# Patient Record
Sex: Male | Born: 1940 | Race: White | Hispanic: No | Smoking: Current some day smoker
Health system: Southern US, Community
[De-identification: ages and names within clinical notes are randomized; demographics above are authoritative.]

## PROBLEM LIST (undated history)

## (undated) DIAGNOSIS — L570 Actinic keratosis: Secondary | ICD-10-CM

## (undated) DIAGNOSIS — Z860100 Personal history of colon polyps, unspecified: Secondary | ICD-10-CM

## (undated) DIAGNOSIS — M8580 Other specified disorders of bone density and structure, unspecified site: Secondary | ICD-10-CM

## (undated) DIAGNOSIS — Z85828 Personal history of other malignant neoplasm of skin: Secondary | ICD-10-CM

## (undated) DIAGNOSIS — C61 Malignant neoplasm of prostate: Secondary | ICD-10-CM

## (undated) DIAGNOSIS — M1990 Unspecified osteoarthritis, unspecified site: Secondary | ICD-10-CM

## (undated) DIAGNOSIS — T7840XA Allergy, unspecified, initial encounter: Secondary | ICD-10-CM

## (undated) HISTORY — PX: KNEE SURGERY: SHX244

## (undated) HISTORY — DX: Malignant neoplasm of prostate: C61

## (undated) HISTORY — DX: Other specified disorders of bone density and structure, unspecified site: M85.80

## (undated) HISTORY — DX: Actinic keratosis: L57.0

## (undated) HISTORY — DX: Personal history of colonic polyps: Z86.010

## (undated) HISTORY — PX: KNEE ARTHROSCOPY: SUR90

## (undated) HISTORY — DX: Personal history of colon polyps, unspecified: Z86.0100

## (undated) HISTORY — DX: Allergy, unspecified, initial encounter: T78.40XA

## (undated) HISTORY — DX: Personal history of other malignant neoplasm of skin: Z85.828

## (undated) HISTORY — PX: INSERTION PROSTATE RADIATION SEED: SUR718

## (undated) HISTORY — DX: Unspecified osteoarthritis, unspecified site: M19.90

## (undated) HISTORY — PX: EYE SURGERY: SHX253

## (undated) HISTORY — PX: TONSILLECTOMY: SUR1361

---

## 2003-12-19 ENCOUNTER — Ambulatory Visit: Payer: Self-pay | Admitting: Internal Medicine

## 2003-12-24 ENCOUNTER — Ambulatory Visit: Payer: Self-pay | Admitting: Internal Medicine

## 2004-03-03 ENCOUNTER — Ambulatory Visit: Admission: RE | Admit: 2004-03-03 | Discharge: 2004-06-01 | Payer: Self-pay | Admitting: Radiation Oncology

## 2004-04-15 ENCOUNTER — Encounter: Admission: RE | Admit: 2004-04-15 | Discharge: 2004-04-15 | Payer: Self-pay | Admitting: Urology

## 2004-05-14 ENCOUNTER — Ambulatory Visit (HOSPITAL_BASED_OUTPATIENT_CLINIC_OR_DEPARTMENT_OTHER): Admission: RE | Admit: 2004-05-14 | Discharge: 2004-05-14 | Payer: Self-pay | Admitting: Urology

## 2004-05-14 ENCOUNTER — Ambulatory Visit (HOSPITAL_COMMUNITY): Admission: RE | Admit: 2004-05-14 | Discharge: 2004-05-14 | Payer: Self-pay | Admitting: Urology

## 2004-06-16 ENCOUNTER — Ambulatory Visit: Admission: RE | Admit: 2004-06-16 | Discharge: 2004-06-16 | Payer: Self-pay | Admitting: Radiation Oncology

## 2004-06-24 ENCOUNTER — Ambulatory Visit: Admission: RE | Admit: 2004-06-24 | Discharge: 2004-06-24 | Payer: Self-pay | Admitting: Radiation Oncology

## 2004-06-27 ENCOUNTER — Ambulatory Visit: Payer: Self-pay | Admitting: Internal Medicine

## 2005-01-26 ENCOUNTER — Ambulatory Visit: Payer: Self-pay | Admitting: Internal Medicine

## 2005-01-30 ENCOUNTER — Ambulatory Visit: Payer: Self-pay | Admitting: Internal Medicine

## 2005-08-04 ENCOUNTER — Ambulatory Visit: Payer: Self-pay | Admitting: Internal Medicine

## 2006-01-12 HISTORY — PX: OTHER SURGICAL HISTORY: SHX169

## 2006-02-01 ENCOUNTER — Ambulatory Visit: Payer: Self-pay | Admitting: Internal Medicine

## 2006-05-18 ENCOUNTER — Ambulatory Visit: Payer: Self-pay | Admitting: Internal Medicine

## 2006-05-18 LAB — CONVERTED CEMR LAB
AST: 28 units/L (ref 0–37)
Alkaline Phosphatase: 58 units/L (ref 39–117)
Basophils Relative: 0.7 % (ref 0.0–1.0)
Bilirubin Urine: NEGATIVE
Bilirubin, Direct: 0.1 mg/dL (ref 0.0–0.3)
Calcium: 9 mg/dL (ref 8.4–10.5)
Chloride: 108 meq/L (ref 96–112)
Cholesterol: 151 mg/dL (ref 0–200)
Creatinine, Ser: 0.9 mg/dL (ref 0.4–1.5)
GFR calc non Af Amer: 90 mL/min
HCT: 45 % (ref 39.0–52.0)
Hemoglobin, Urine: NEGATIVE
Hemoglobin: 15.7 g/dL (ref 13.0–17.0)
Ketones, ur: NEGATIVE mg/dL
LDL Cholesterol: 106 mg/dL — ABNORMAL HIGH (ref 0–99)
MCHC: 34.9 g/dL (ref 30.0–36.0)
PSA: 0.79 ng/mL (ref 0.10–4.00)
Platelets: 199 10*3/uL (ref 150–400)
RBC: 4.54 M/uL (ref 4.22–5.81)
RDW: 12.2 % (ref 11.5–14.6)
Sodium: 141 meq/L (ref 135–145)
TSH: 1.05 microintl units/mL (ref 0.35–5.50)
Total Bilirubin: 1.1 mg/dL (ref 0.3–1.2)
Total Protein: 7.1 g/dL (ref 6.0–8.3)
Urine Glucose: NEGATIVE mg/dL
Urobilinogen, UA: 1 (ref 0.0–1.0)

## 2006-05-25 ENCOUNTER — Ambulatory Visit: Payer: Self-pay | Admitting: Internal Medicine

## 2006-06-18 ENCOUNTER — Ambulatory Visit: Payer: Self-pay | Admitting: Gastroenterology

## 2006-07-02 ENCOUNTER — Ambulatory Visit: Payer: Self-pay | Admitting: Gastroenterology

## 2006-10-14 ENCOUNTER — Encounter: Payer: Self-pay | Admitting: Internal Medicine

## 2006-10-14 DIAGNOSIS — Z8546 Personal history of malignant neoplasm of prostate: Secondary | ICD-10-CM | POA: Insufficient documentation

## 2006-11-18 ENCOUNTER — Ambulatory Visit: Payer: Self-pay | Admitting: Internal Medicine

## 2006-11-18 DIAGNOSIS — M8580 Other specified disorders of bone density and structure, unspecified site: Secondary | ICD-10-CM | POA: Insufficient documentation

## 2006-11-18 DIAGNOSIS — M949 Disorder of cartilage, unspecified: Secondary | ICD-10-CM

## 2006-11-18 DIAGNOSIS — M899 Disorder of bone, unspecified: Secondary | ICD-10-CM | POA: Insufficient documentation

## 2006-11-18 DIAGNOSIS — J309 Allergic rhinitis, unspecified: Secondary | ICD-10-CM | POA: Insufficient documentation

## 2007-03-08 ENCOUNTER — Encounter: Payer: Self-pay | Admitting: Internal Medicine

## 2007-04-28 ENCOUNTER — Ambulatory Visit: Payer: Self-pay | Admitting: Internal Medicine

## 2007-04-28 LAB — CONVERTED CEMR LAB
Albumin: 3.7 g/dL (ref 3.5–5.2)
Alkaline Phosphatase: 58 units/L (ref 39–117)
BUN: 15 mg/dL (ref 6–23)
Basophils Absolute: 0 10*3/uL (ref 0.0–0.1)
Bilirubin Urine: NEGATIVE
Chloride: 105 meq/L (ref 96–112)
Cholesterol: 148 mg/dL (ref 0–200)
Creatinine, Ser: 0.9 mg/dL (ref 0.4–1.5)
Crystals: NEGATIVE
Eosinophils Absolute: 0.3 10*3/uL (ref 0.0–0.7)
GFR calc Af Amer: 109 mL/min
LDL Cholesterol: 107 mg/dL — ABNORMAL HIGH (ref 0–99)
MCHC: 34.8 g/dL (ref 30.0–36.0)
Monocytes Absolute: 0.4 10*3/uL (ref 0.1–1.0)
Neutro Abs: 2.3 10*3/uL (ref 1.4–7.7)
PSA: 0.18 ng/mL (ref 0.10–4.00)
Potassium: 4.1 meq/L (ref 3.5–5.1)
RDW: 12.4 % (ref 11.5–14.6)
Specific Gravity, Urine: >=1.03 (ref 1.000–1.03)
TSH: 1.46 microintl units/mL (ref 0.35–5.50)
Total Protein, Urine: NEGATIVE mg/dL
Triglycerides: 45 mg/dL (ref 0–149)
Urine Glucose: NEGATIVE mg/dL
Urobilinogen, UA: 1 (ref 0.0–1.0)
WBC: 4.9 10*3/uL (ref 4.5–10.5)
pH: 5.5 (ref 5.0–8.0)

## 2007-05-02 ENCOUNTER — Ambulatory Visit: Payer: Self-pay | Admitting: Internal Medicine

## 2007-05-02 DIAGNOSIS — Z860100 Personal history of colon polyps, unspecified: Secondary | ICD-10-CM | POA: Insufficient documentation

## 2007-06-22 ENCOUNTER — Encounter: Payer: Self-pay | Admitting: Internal Medicine

## 2007-10-31 ENCOUNTER — Ambulatory Visit: Payer: Self-pay | Admitting: Internal Medicine

## 2007-10-31 DIAGNOSIS — R1031 Right lower quadrant pain: Secondary | ICD-10-CM | POA: Insufficient documentation

## 2007-10-31 DIAGNOSIS — R1110 Vomiting, unspecified: Secondary | ICD-10-CM | POA: Insufficient documentation

## 2007-10-31 LAB — CONVERTED CEMR LAB
AST: 26 units/L (ref 0–37)
Alkaline Phosphatase: 67 units/L (ref 39–117)
Basophils Absolute: 0 10*3/uL (ref 0.0–0.1)
CO2: 29 meq/L (ref 19–32)
Creatinine, Ser: 1.4 mg/dL (ref 0.4–1.5)
Eosinophils Absolute: 0 10*3/uL (ref 0.0–0.7)
GFR calc non Af Amer: 54 mL/min
Glucose, Bld: 112 mg/dL — ABNORMAL HIGH (ref 70–99)
HCT: 46.1 % (ref 39.0–52.0)
Hemoglobin: 16.2 g/dL (ref 13.0–17.0)
Lymphocytes Relative: 16.9 % (ref 12.0–46.0)
MCHC: 35.2 g/dL (ref 30.0–36.0)
Monocytes Absolute: 0.7 10*3/uL (ref 0.1–1.0)
Neutro Abs: 7.2 10*3/uL (ref 1.4–7.7)
Neutrophils Relative %: 74.5 % (ref 43.0–77.0)
Platelets: 177 10*3/uL (ref 150–400)
Potassium: 4.1 meq/L (ref 3.5–5.1)
RBC / HPF: NONE SEEN
RDW: 12 % (ref 11.5–14.6)
Sed Rate: 13 mm/hr (ref 0–16)
Specific Gravity, Urine: >=1.03 (ref 1.000–1.03)
Total Bilirubin: 1.1 mg/dL (ref 0.3–1.2)
Total Protein, Urine: 30 mg/dL — AB

## 2007-12-02 ENCOUNTER — Encounter: Payer: Self-pay | Admitting: Internal Medicine

## 2008-03-27 ENCOUNTER — Ambulatory Visit: Payer: Self-pay | Admitting: Internal Medicine

## 2008-03-27 DIAGNOSIS — R319 Hematuria, unspecified: Secondary | ICD-10-CM | POA: Insufficient documentation

## 2008-03-27 LAB — CONVERTED CEMR LAB
BUN: 14 mg/dL (ref 6–23)
Basophils Absolute: 0.1 10*3/uL (ref 0.0–0.1)
Basophils Relative: 0.9 % (ref 0.0–3.0)
Calcium: 8.9 mg/dL (ref 8.4–10.5)
Chloride: 105 meq/L (ref 96–112)
Creatinine, Ser: 1.2 mg/dL (ref 0.4–1.5)
Eosinophils Absolute: 0.1 10*3/uL (ref 0.0–0.7)
Glucose, Bld: 91 mg/dL (ref 70–99)
HCT: 45.5 % (ref 39.0–52.0)
INR: 1 (ref 0.8–1.0)
Lymphs Abs: 1.8 10*3/uL (ref 0.7–4.0)
MCHC: 35.5 g/dL (ref 30.0–36.0)
MCV: 99.7 fL (ref 78.0–100.0)
Neutrophils Relative %: 56.5 % (ref 43.0–77.0)
Nitrite: POSITIVE
Platelets: 180 10*3/uL (ref 150.0–400.0)
RBC: 4.57 M/uL (ref 4.22–5.81)
RDW: 12.4 % (ref 11.5–14.6)
Sodium: 142 meq/L (ref 135–145)
Specific Gravity, Urine: 1.02 (ref 1.000–1.030)
Total Protein, Urine: 100 mg/dL
pH: 8.5 (ref 5.0–8.0)

## 2008-04-02 ENCOUNTER — Encounter: Admission: RE | Admit: 2008-04-02 | Discharge: 2008-04-02 | Payer: Self-pay | Admitting: Internal Medicine

## 2008-05-01 ENCOUNTER — Ambulatory Visit: Payer: Self-pay | Admitting: Internal Medicine

## 2008-05-01 LAB — CONVERTED CEMR LAB
Albumin: 3.8 g/dL (ref 3.5–5.2)
Alkaline Phosphatase: 63 units/L (ref 39–117)
BUN: 16 mg/dL (ref 6–23)
Basophils Relative: 1 % (ref 0.0–3.0)
Bilirubin, Direct: 0.1 mg/dL (ref 0.0–0.3)
CO2: 30 meq/L (ref 19–32)
Calcium: 9.1 mg/dL (ref 8.4–10.5)
Cholesterol: 151 mg/dL (ref 0–200)
Eosinophils Relative: 3.5 % (ref 0.0–5.0)
Glucose, Bld: 96 mg/dL (ref 70–99)
HDL: 34.8 mg/dL — ABNORMAL LOW (ref >39.00)
Hemoglobin: 15.9 g/dL (ref 13.0–17.0)
Ketones, ur: NEGATIVE mg/dL
MCHC: 34.6 g/dL (ref 30.0–36.0)
MCV: 99 fL (ref 78.0–100.0)
Neutrophils Relative %: 47.6 % (ref 43.0–77.0)
Nitrite: NEGATIVE
Platelets: 162 10*3/uL (ref 150.0–400.0)
RBC: 4.64 M/uL (ref 4.22–5.81)
Sodium: 144 meq/L (ref 135–145)
Total Bilirubin: 0.8 mg/dL (ref 0.3–1.2)
Total Protein: 7 g/dL (ref 6.0–8.3)
Urine Glucose: NEGATIVE mg/dL
Urobilinogen, UA: 0.2 (ref 0.0–1.0)

## 2008-05-04 ENCOUNTER — Ambulatory Visit: Payer: Self-pay | Admitting: Internal Medicine

## 2008-05-04 DIAGNOSIS — Z85828 Personal history of other malignant neoplasm of skin: Secondary | ICD-10-CM | POA: Insufficient documentation

## 2008-09-10 ENCOUNTER — Ambulatory Visit: Payer: Self-pay | Admitting: Family Medicine

## 2008-09-10 ENCOUNTER — Encounter: Payer: Self-pay | Admitting: Internal Medicine

## 2008-09-10 ENCOUNTER — Telehealth: Payer: Self-pay | Admitting: Internal Medicine

## 2008-09-13 ENCOUNTER — Ambulatory Visit: Payer: Self-pay | Admitting: Internal Medicine

## 2009-03-20 ENCOUNTER — Ambulatory Visit: Payer: Self-pay | Admitting: Internal Medicine

## 2009-03-20 DIAGNOSIS — I889 Nonspecific lymphadenitis, unspecified: Secondary | ICD-10-CM | POA: Insufficient documentation

## 2009-03-21 LAB — CONVERTED CEMR LAB
Basophils Absolute: 0 10*3/uL (ref 0.0–0.1)
Eosinophils Absolute: 0.1 10*3/uL (ref 0.0–0.7)
Eosinophils Relative: 1.4 % (ref 0.0–5.0)
Hemoglobin: 15.5 g/dL (ref 13.0–17.0)
Lymphocytes Relative: 29.3 % (ref 12.0–46.0)
MCHC: 33.4 g/dL (ref 30.0–36.0)
MCV: 102.5 fL — ABNORMAL HIGH (ref 78.0–100.0)
RBC: 4.53 M/uL (ref 4.22–5.81)
WBC: 6.2 10*3/uL (ref 4.5–10.5)

## 2009-04-05 ENCOUNTER — Encounter: Payer: Self-pay | Admitting: Internal Medicine

## 2009-08-19 ENCOUNTER — Ambulatory Visit: Payer: Self-pay | Admitting: Internal Medicine

## 2009-08-19 LAB — CONVERTED CEMR LAB
ALT: 31 units/L (ref 0–53)
AST: 24 units/L (ref 0–37)
Albumin: 3.8 g/dL (ref 3.5–5.2)
Alkaline Phosphatase: 57 units/L (ref 39–117)
Basophils Absolute: 0 10*3/uL (ref 0.0–0.1)
Basophils Relative: 0.4 % (ref 0.0–3.0)
Bilirubin Urine: NEGATIVE
Bilirubin, Direct: 0.1 mg/dL (ref 0.0–0.3)
CO2: 30 meq/L (ref 19–32)
Eosinophils Absolute: 0.2 10*3/uL (ref 0.0–0.7)
Glucose, Bld: 104 mg/dL — ABNORMAL HIGH (ref 70–99)
HDL: 34.4 mg/dL — ABNORMAL LOW (ref >39.00)
Hemoglobin: 15.8 g/dL (ref 13.0–17.0)
Leukocytes, UA: NEGATIVE
MCHC: 35.5 g/dL (ref 30.0–36.0)
MCV: 100.5 fL — ABNORMAL HIGH (ref 78.0–100.0)
Monocytes Absolute: 0.5 10*3/uL (ref 0.1–1.0)
Neutro Abs: 2.5 10*3/uL (ref 1.4–7.7)
Nitrite: NEGATIVE
PSA: 0.06 ng/mL — ABNORMAL LOW (ref 0.10–4.00)
Potassium: 4.5 meq/L (ref 3.5–5.1)
RBC: 4.41 M/uL (ref 4.22–5.81)
Total Bilirubin: 0.6 mg/dL (ref 0.3–1.2)
Triglycerides: 73 mg/dL (ref 0.0–149.0)
VLDL: 14.6 mg/dL (ref 0.0–40.0)

## 2009-08-22 ENCOUNTER — Ambulatory Visit: Payer: Self-pay | Admitting: Internal Medicine

## 2009-08-22 ENCOUNTER — Encounter: Payer: Self-pay | Admitting: Internal Medicine

## 2009-08-22 DIAGNOSIS — R0989 Other specified symptoms and signs involving the circulatory and respiratory systems: Secondary | ICD-10-CM | POA: Insufficient documentation

## 2009-08-26 ENCOUNTER — Encounter: Payer: Self-pay | Admitting: Internal Medicine

## 2009-08-27 ENCOUNTER — Ambulatory Visit: Payer: Self-pay

## 2009-08-27 ENCOUNTER — Encounter: Payer: Self-pay | Admitting: Internal Medicine

## 2009-10-08 ENCOUNTER — Encounter: Payer: Self-pay | Admitting: Internal Medicine

## 2009-10-18 ENCOUNTER — Ambulatory Visit: Payer: Self-pay | Admitting: Internal Medicine

## 2010-01-08 ENCOUNTER — Ambulatory Visit
Admission: RE | Admit: 2010-01-08 | Discharge: 2010-01-08 | Payer: Self-pay | Source: Home / Self Care | Attending: Internal Medicine | Admitting: Internal Medicine

## 2010-01-08 DIAGNOSIS — J019 Acute sinusitis, unspecified: Secondary | ICD-10-CM | POA: Insufficient documentation

## 2010-01-20 ENCOUNTER — Ambulatory Visit
Admission: RE | Admit: 2010-01-20 | Discharge: 2010-01-20 | Payer: Self-pay | Source: Home / Self Care | Attending: Internal Medicine | Admitting: Internal Medicine

## 2010-01-20 DIAGNOSIS — J310 Chronic rhinitis: Secondary | ICD-10-CM | POA: Insufficient documentation

## 2010-02-11 NOTE — Miscellaneous (Signed)
Summary: Orders Update  Clinical Lists Changes  Orders: Added new Test order of Carotid Duplex (Carotid Duplex) - Signed 

## 2010-02-11 NOTE — Assessment & Plan Note (Signed)
Summary: CPX/BCBS, MEDICARE/STILL EMPLOYED/STATES INS PAYS FOR LABS/CD   Vital Signs:  Patient profile:   70 year old male Height:      68 inches Weight:      204 pounds BMI:     31.13 O2 Sat:      96 % on Room air Temp:     98.4 degrees F oral Pulse rate:   68 / minute Pulse rhythm:   regular Resp:     16 per minute BP sitting:   110 / 70  (right arm) Cuff size:   regular  Vitals Entered By: Lanier Prude, CMA(AAMA) (August 22, 2009 8:41 AM)  O2 Flow:  Room air CC: AWV Is Patient Diabetic? No   CC:  AWV.  History of Present Illness: The patient presents for a preventive health examination   Current Medications (verified): 1)  Fish Oil   Oil (Fish Oil) .Marland Kitchen.. 1 Bid 2)  Vitamin D3 1000 Unit  Tabs (Cholecalciferol) .Marland Kitchen.. 1 By Mouth Daily  Allergies (verified): 1)  ! Adult Aspirin Low Strength (Aspirin) 2)  ! Zostrix (Capsaicin) 3)  ! Naprosyn (Naproxen)  Past History:  Past Medical History: Reviewed history from 05/04/2008 and no changes required. Prostate cancer, hx of 2006 s/p XRT, seeds Dr Chip Boer  has a Dermatologist apt q 1 y Osteopenia Allergic rhinitis Colonic polyps, hx of Dr Christella Hartigan due 2018 Skin cancer, hx of  Past Surgical History: XRT seeds 2008  Family History: Old age  F 30 liver mets M living 73  Social History: Occupation: traveling Airline pilot - ink Married Never Smoked  Review of Systems  The patient denies anorexia, fever, weight loss, weight gain, vision loss, decreased hearing, hoarseness, chest pain, syncope, dyspnea on exertion, peripheral edema, prolonged cough, headaches, hemoptysis, abdominal pain, melena, hematochezia, severe indigestion/heartburn, hematuria, incontinence, genital sores, muscle weakness, suspicious skin lesions, transient blindness, difficulty walking, depression, unusual weight change, abnormal bleeding, enlarged lymph nodes, angioedema, and testicular masses.    Physical Exam  General:   Well-developed,well-nourished,in no acute distress; alert,appropriate and cooperative throughout examination Head:  Normocephalic and atraumatic without obvious abnormalities. No apparent alopecia or balding. Eyes:  No corneal or conjunctival inflammation noted. EOMI. Perrla. Ears:  R wax Nose:  External nasal examination shows no deformity or inflammation. Nasal mucosa are pink and moist without lesions or exudates. Mouth:  Oral mucosa and oropharynx without lesions or exudates.  Teeth in good repair. Neck:  R mild bruit Lungs:  Normal respiratory effort, chest expands symmetrically. Lungs are clear to auscultation, no crackles or wheezes. Heart:  Normal rate and regular rhythm. S1 and S2 normal without gallop, murmur, click, rub or other extra sounds. Abdomen:  Bowel sounds positive,abdomen soft and non-tender without masses, organomegaly or hernias noted. Rectal:  per Urol Genitalia:  per Urol Prostate:  per Urol Msk:  No deformity or scoliosis noted of thoracic or lumbar spine.   Pulses:  R and L carotid,radial,femoral,dorsalis pedis and posterior tibial pulses are full and equal bilaterally Extremities:  No clubbing, cyanosis, edema, or deformity noted with normal full range of motion of all joints.   Neurologic:  No cranial nerve deficits noted. Station and gait are normal. Plantar reflexes are down-going bilaterally. DTRs are symmetrical throughout. Sensory, motor and coordinative functions appear intact. Skin:  Intact without suspicious lesions or rashes Cervical Nodes:  No lymphadenopathy noted Psych:  Cognition and judgment appear intact. Alert and cooperative with normal attention span and concentration. No apparent delusions, illusions, hallucinations   Impression &  Recommendations:  Problem # 1:  WELL ADULT EXAM (ICD-V70.0) Assessment New The labs were reviewed with the patient.  Health and age related issues were discussed. Available screening tests and vaccinations were  discussed as well. Healthy life style including good diet and exercise was discussed.  Orders: EKG w/ Interpretation (93000) nl  Problem # 2:  PROSTATE CANCER, HX OF (ICD-V10.46) Assessment: Improved F/u w/Urol  Problem # 3:  CAROTID BRUIT (ICD-785.9) Assessment: New ASA Orders: Doppler Referral (Doppler)  Complete Medication List: 1)  Fish Oil Oil (Fish oil) .Marland Kitchen.. 1 bid 2)  Vitamin D3 1000 Unit Tabs (Cholecalciferol) .Marland Kitchen.. 1 by mouth daily  Other Orders: Pneumococcal Vaccine (16109) Admin 1st Vaccine (60454)  Patient Instructions: 1)  Please schedule a follow-up appointment in 1 year well w/labs.   Immunizations Administered:  Pneumonia Vaccine:    Vaccine Type: Pneumovax    Site: left deltoid    Mfr: Merck    Dose: 0.5 ml    Route: IM    Given by: Lanier Prude, CMA(AAMA)    Exp. Date: 01/29/2011    Lot #: [id number]    VIS given: 08/10/95 version given August 22, 2009.

## 2010-02-11 NOTE — Letter (Signed)
[facility]   Imported By: Sherian Rein 04/18/2009 10:00:17  _____________________________________________________________________  External Attachment:    Type:   Image     Comment:   External Document

## 2010-02-11 NOTE — Assessment & Plan Note (Signed)
Summary: SHINGLE VAC-PER PT BCBS WILL PAY -AVP--AMI/OK DOUBLEBOOK--STC   Nurse Visit   Allergies: 1)  ! Adult Aspirin Low Strength (Aspirin) 2)  ! Zostrix (Capsaicin) 3)  ! Naprosyn (Naproxen)  Immunizations Administered:  Zostavax # 1:    Vaccine Type: Zostavax    Site: Left arm     Mfr: Merck    Dose: 0.5 ml    Route: IM    Given by: Lamar Sprinkles, CMA    Exp. Date: 08/07/2010    Lot #: [id number]    VIS given: 10/24/04 given October 18, 2009.  Orders Added: 1)  Zoster (Shingles) Vaccine Live [90736] 2)  Admin 1st Vaccine [id number]

## 2010-02-11 NOTE — Letter (Signed)
Summary: [facility]   Imported By: Sherian Rein 10/22/2009 13:53:51  _____________________________________________________________________  External Attachment:    Type:   Image     Comment:   External Document

## 2010-02-11 NOTE — Assessment & Plan Note (Signed)
Summary: lump top of neck,tender/#/cd   Vital Signs:  Patient profile:   70 year old male Height:      68 inches Weight:      203 pounds BMI:     30.98 Temp:     99 degrees F oral Pulse rate:   76 / minute BP sitting:   124 / 84  (left arm)  Vitals Entered By: Tora Perches (March 20, 2009 8:24 AM) CC: lump on neck Is Patient Diabetic? No   CC:  lump on neck.  History of Present Illness: C/o painful lump on R neck x 1 wk  Preventive Screening-Counseling & Management  Alcohol-Tobacco     Smoking Status: never  Current Medications (verified): 1)  Fish Oil   Oil (Fish Oil) .Marland Kitchen.. 1 Bid 2)  Vitamin D3 1000 Unit  Tabs (Cholecalciferol) .Marland Kitchen.. 1 By Mouth Daily  Allergies: 1)  ! Adult Aspirin Low Strength (Aspirin) 2)  ! Zostrix (Capsaicin) 3)  ! 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 Nasal mucosa are pink and moist without lesions or exudates. Mouth:  Oral mucosa and oropharynx without lesions or exudates.  Teeth in good repair. Neck:  2.5x 1.5 oval firm tender swelling over R lat neck below jaw angle Skin:  Intact without suspicious lesions or rashes Cervical Nodes:  as above Axillary Nodes:  No palpable lymphadenopathy Inguinal Nodes:  No significant adenopathy   Impression & Recommendations:  Problem # 1:  LYMPHADENITIS (ICD-289.3) R neck Assessment New Augm x 10 d CT or biopsy if not gone Orders: TLB-CBC Platelet - w/Differential  (85025-CBCD)  Complete Medication List: 1)  Fish Oil Oil (Fish oil) .Marland Kitchen.. 1 bid 2)  Vitamin D3 1000 Unit Tabs (Cholecalciferol) .Marland Kitchen.. 1 by mouth daily 3)  Augmentin 875-125 Mg Tabs (Amoxicillin-pot clavulanate) .Marland Kitchen.. 1 by mouth bid  Patient Instructions: 1)  Please schedule a follow-up appointment in 10 d. Prescriptions: AUGMENTIN 875-125 MG TABS (AMOXICILLIN-POT CLAVULANATE) 1 by mouth bid  #20 x 0   Entered and Authorized by:   Tresa Garter MD   Signed by:   Tresa Garter MD on 03/20/2009   Method used:   [facility] to        [facility] [phone]* (retail)       [address]       [place], [place]  [address]       Ph: [phone]       Fax: [phone]   RxID:   [id number]

## 2010-02-13 NOTE — Assessment & Plan Note (Signed)
Summary: SINUS/NWS   Vital Signs:  Patient profile:   70 year old male Height:      68 inches Weight:      201 pounds BMI:     30.67 Temp:     97.8 degrees F oral Pulse rate:   76 / minute Pulse rhythm:   regular Resp:     16 per minute BP sitting:   140 / 86  (left arm) Cuff size:   regular  Vitals Entered By: Lanier Prude, CMA(AAMA) (January 20, 2010 4:20 PM) CC: sinus congestion  Is Patient Diabetic? No   CC:  sinus congestion .  History of Present Illness: C/o L nasal congestion - bad D/c is clear now - antibiotic has helped  Current Medications (verified): 1)  Fish Oil   Oil (Fish Oil) .Marland Kitchen.. 1 Once Daily 2)  Vitamin D3 1000 Unit  Tabs (Cholecalciferol) .Marland Kitchen.. 1 By Mouth Daily  Allergies (verified): 1)  ! Adult Aspirin Low Strength (Aspirin) 2)  ! Zostrix (Capsaicin) 3)  ! 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    Physical Exam  General:  Well-developed,well-nourished,in no acute distress; alert,appropriate and cooperative throughout examination Nose:  swollen mucosa B Mouth:  WNL Lungs:  Normal respiratory effort, chest expands symmetrically. Lungs are clear to auscultation, no crackles or wheezes. Heart:  Normal rate and regular rhythm. S1 and S2 normal without gallop, murmur, click, rub or other extra sounds.   Impression & Recommendations:  Problem # 1:  SINUSITIS, ACUTE (ICD-461.9) resolved Assessment Improved  His updated medication list for this problem includes:    Flonase 50 Mcg/act Susp (Fluticasone propionate) .Marland Kitchen... 1 spr each nostr  qd as needed  Orders: ENT Referral (ENT)  Problem # 2:  RHINITIS (ICD-472.0) L>>R Assessment: Deteriorated  Flonase Take Pred 40mg  qd for 3 days, then 20 mg qd for 3 days, then 10mg  qd for 6 days, then stop. Take pc.  ENT if not better.  Orders: ENT Referral (ENT)  Complete Medication List: 1)  Fish Oil Oil (Fish oil) .Marland Kitchen.. 1 once daily 2)  Vitamin D3 1000 Unit Tabs (Cholecalciferol) .Marland Kitchen.. 1 by mouth daily 3)  Prednisone 10 Mg Tabs (Prednisone) .... Take 40mg  qd for 3 days, then 20 mg qd for 3 days, then 10mg  qd for 6 days, then stop. take pc. 4)  Flonase 50 Mcg/act Susp (Fluticasone propionate) .Marland Kitchen.. 1 spr each nostr qd as needed 5)  Loratadine 10 Mg Tabs (Loratadine) .Marland Kitchen.. 1 by mouth once daily as needed allergies  Patient Instructions: 1)  Call if you are not better in a reasonable amount of time (1-2 wks) or if worse.  Prescriptions: LORATADINE 10 MG TABS (LORATADINE) 1 by mouth once daily as needed allergies  #30 x 6   Entered and Authorized by:   Tresa Garter MD   Signed by:   Tresa Garter MD on 01/20/2010   Method used:   [facility] to        [facility] [phone]* (retail)       [address]  [place], [place]  [address]       Ph: [phone]       Fax: [phone]   RxID:   [id number] FLONASE 50 MCG/ACT SUSP (FLUTICASONE PROPIONATE) 1 spr each nostr qd as needed  #3 x 3   Entered and Authorized by:   Tresa Garter MD   Signed by:   Tresa Garter MD on 01/20/2010   Method used:   Electronically to        [facility] [phone]* (retail)       [address]       [place], [place]  [address]       Ph: [phone]       Fax: [phone]   RxID:   [id number] PREDNISONE 10 MG TABS (PREDNISONE) Take 40mg  qd for 3 days, then 20 mg qd for 3 days, then 10mg  qd for 6 days, then stop. Take pc.  #24 x 1   Entered and Authorized by:   Tresa Garter MD   Signed by:    Tresa Garter MD on 01/20/2010   Method used:   Electronically to        [facility] [phone]* (retail)       [address]       [place], [place]  [address]       Ph: [phone]       Fax: [phone]   RxID:   [id number]    Orders Added: 1)  ENT Referral [ENT] 2)  Est. Patient Level III [41740]

## 2010-02-13 NOTE — Assessment & Plan Note (Signed)
Summary: SINUS PROBLEM---STC   Vital Signs:  Patient profile:   70 year old male Height:      68 inches Weight:      203 pounds BMI:     30.98 Temp:     98.6 degrees F oral Pulse rate:   74 / minute Pulse rhythm:   regular Resp:     16 per minute BP sitting:   128 / 94  (left arm) Cuff size:   regular  Vitals Entered By: Lanier Prude, CMA(AAMA) (January 08, 2010 7:50 AM) CC: sinus pressure & congestion X 1 month, cough Is Patient Diabetic? No Comments pt is not taking Vit D3   CC:  sinus pressure & congestion X 1 month and cough.  History of Present Illness: The patient presents with complaints of cough, sinus congestion and drainge of 4 wks duration. Not better with OTC meds..  The mucus is colored.   Current Medications (verified): 1)  Fish Oil   Oil (Fish Oil) .Marland Kitchen.. 1 Once Daily 2)  Vitamin D3 1000 Unit  Tabs (Cholecalciferol) .Marland Kitchen.. 1 By Mouth Daily  Allergies (verified): 1)  ! Adult Aspirin Low Strength (Aspirin) 2)  ! Zostrix (Capsaicin) 3)  ! 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 Lungs are clear to auscultation, no crackles or wheezes. Heart:  Normal rate and regular rhythm. S1 and S2 normal without gallop, murmur, click, rub or other extra sounds. Abdomen:  Bowel sounds positive,abdomen soft and non-tender without masses, organomegaly or hernias  noted. Msk:  No deformity or scoliosis noted of thoracic or lumbar spine.     Impression & Recommendations:  Problem # 1:  SINUSITIS, ACUTE (ICD-461.9) Assessment New  His updated medication list for this problem includes:    Augmentin 875-125 Mg Tabs (Amoxicillin-pot clavulanate) .Marland Kitchen... 1 by mouth bid  Complete Medication List: 1)  Fish Oil Oil (Fish oil) .Marland Kitchen.. 1 once daily 2)  Vitamin D3 1000 Unit Tabs (Cholecalciferol) .Marland Kitchen.. 1 by mouth daily 3)  Augmentin 875-125 Mg Tabs (Amoxicillin-pot clavulanate) .Marland Kitchen.. 1 by mouth bid  Patient Instructions: 1)  Use the Sinus rinse as needed  2)  Call if you are not better in a reasonable amount of time or if worse.  Prescriptions: AUGMENTIN 875-125 MG TABS (AMOXICILLIN-POT CLAVULANATE) 1 by mouth bid  #20 x 0   Entered and Authorized by:   Tresa Garter MD   Signed by:   Tresa Garter MD on 01/08/2010   Method used:   Electronically to        [facility] [phone]* (retail)       [address]       [place], [place]  [address]       Ph: [phone]       Fax: [phone]   RxID:   [id number]    Orders Added: 1)  Est. Patient Level III [24401]

## 2010-03-18 IMAGING — CT CT PELVIS W/ CM
2 of 5 series · 17 of 46 positions shown, 19 images · IV contrast (agent unspecified)
Comparison: No priors

CT ABDOMEN

CLINICAL DATA: Right lower quadrant pain with nausea and vomiting

CT ABDOMEN AND PELVIS WITH CONTRAST
TECHNIQUE: Multidetector CT imaging of the abdomen and pelvis was
performed using the standard protocol following bolus
administration of intravenous contrast.
Contrast: 70 ml 2mnipaque-8CC

[Series 2: abd_pel 5.0 b30f st · axial · 0.79mm/px · z∈[-448,-48]mm · 14 of 92 slices shown, 16 images]
[im 6/92  soft-tissue]
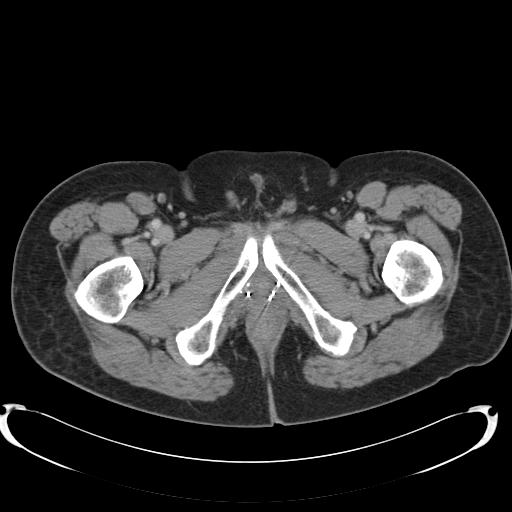
[im 6/92  bone]
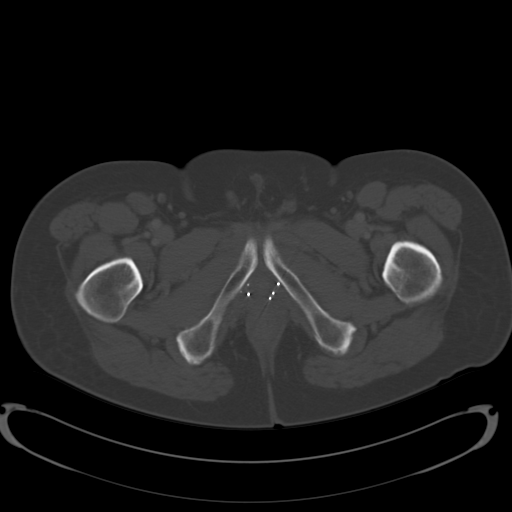
[im 11/92  soft-tissue]
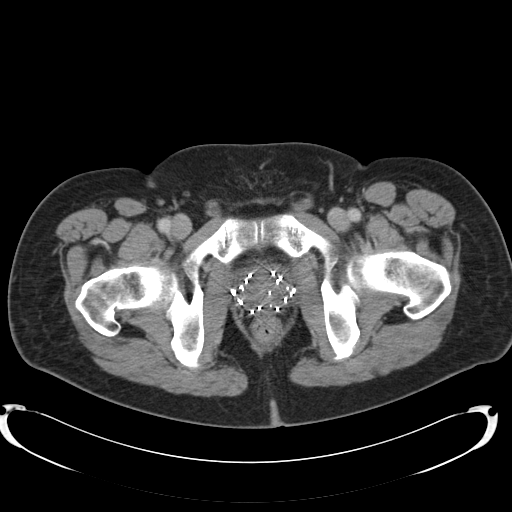
[im 17/92  soft-tissue]
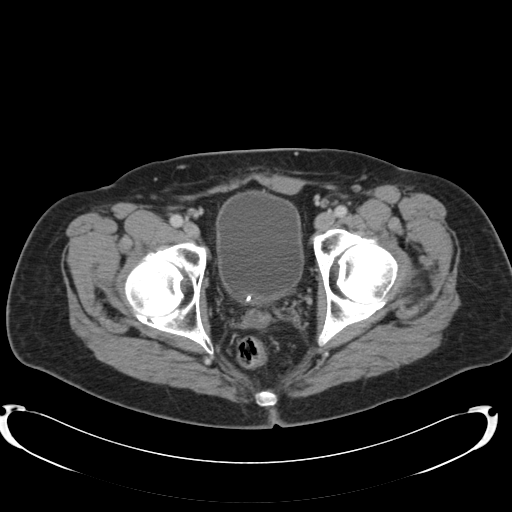
[im 27/92  soft-tissue]
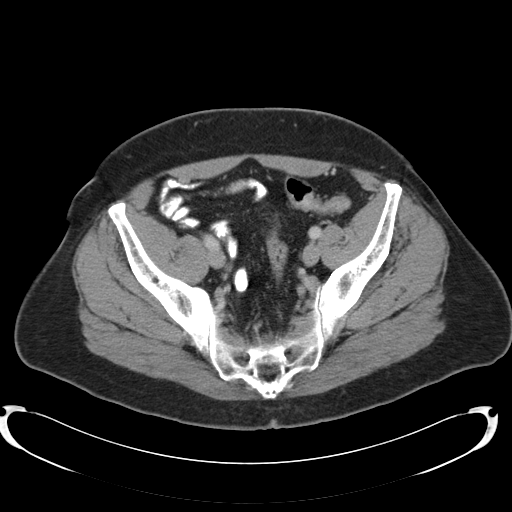
[im 33/92  soft-tissue]
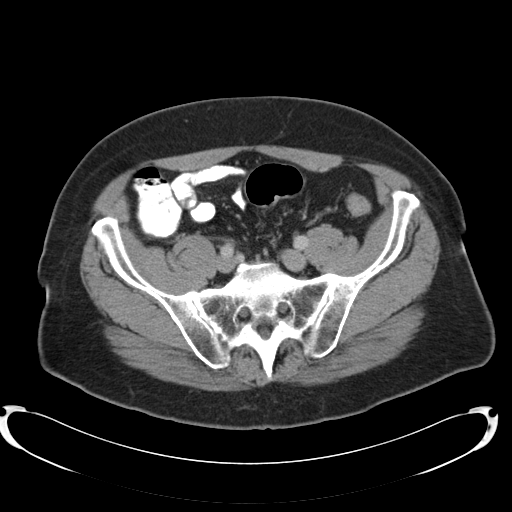
[im 38/92  soft-tissue]
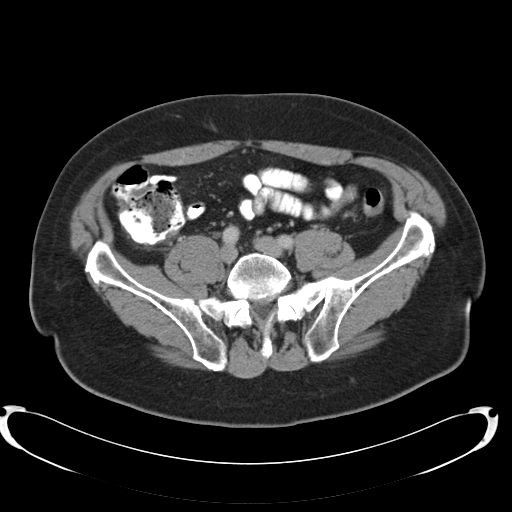
[im 43/92  soft-tissue]
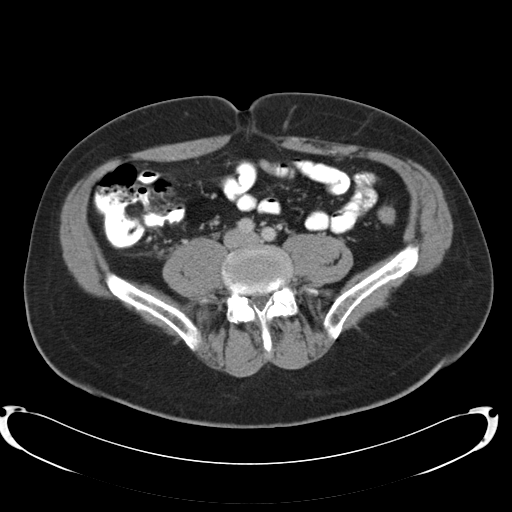
[im 49/92  soft-tissue]
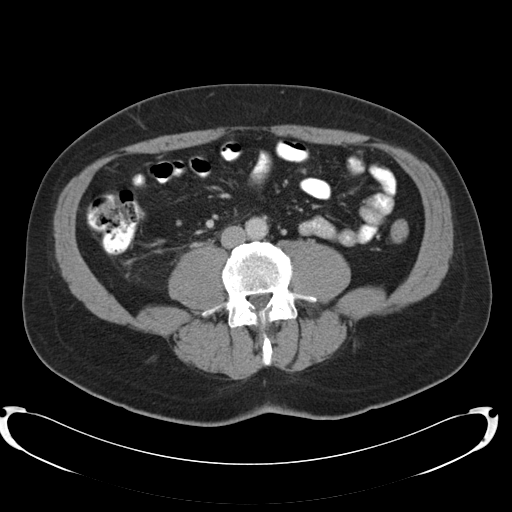
[im 54/92  soft-tissue]
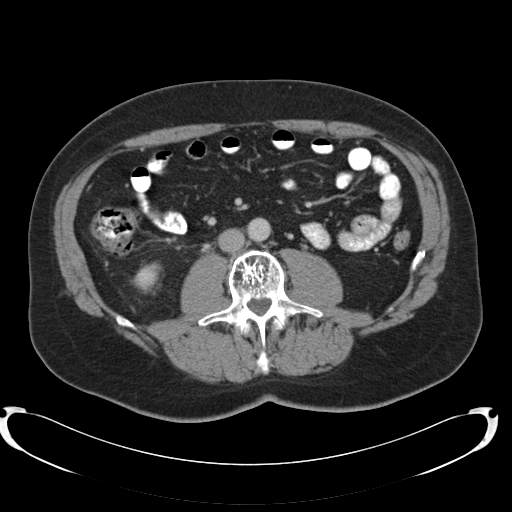
[im 54/92  bone]
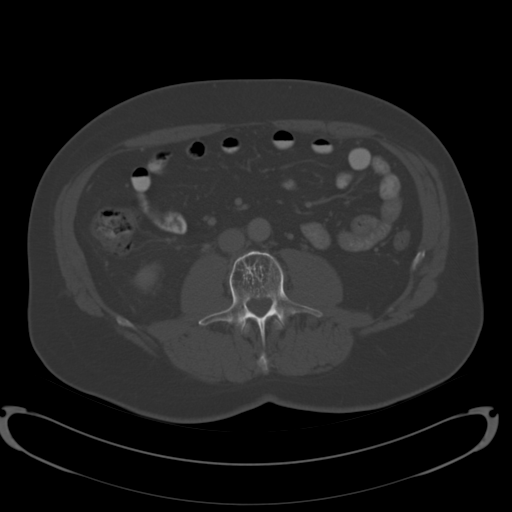
[im 59/92  soft-tissue]
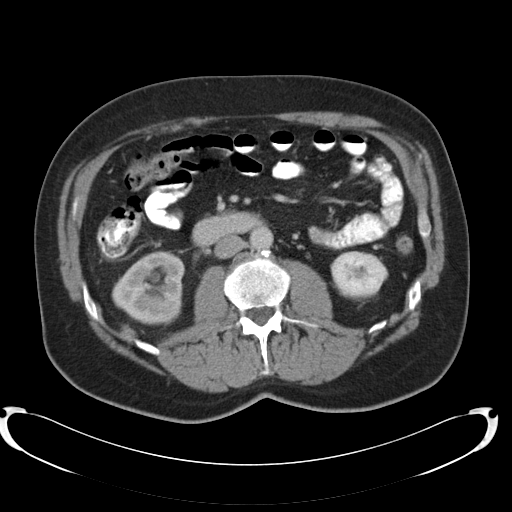
[im 70/92  soft-tissue]
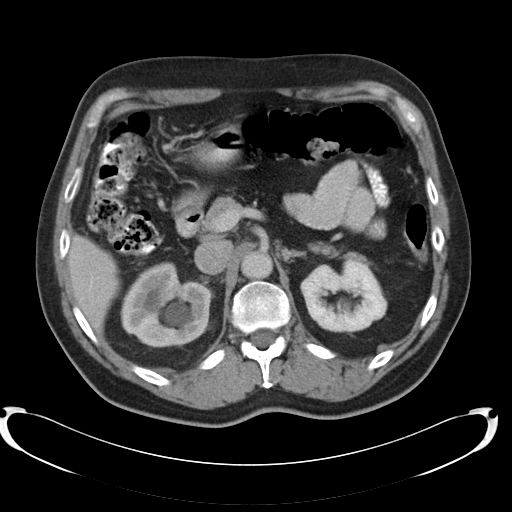
[im 75/92  soft-tissue]
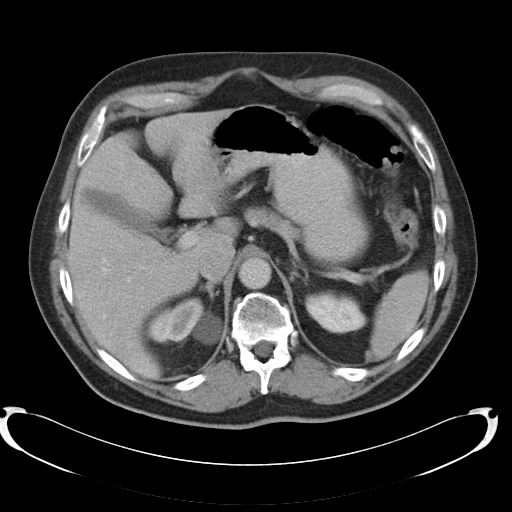
[im 81/92  soft-tissue]
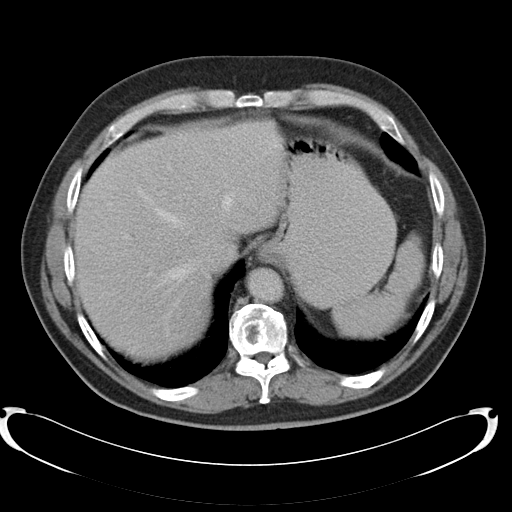
[im 86/92  soft-tissue]
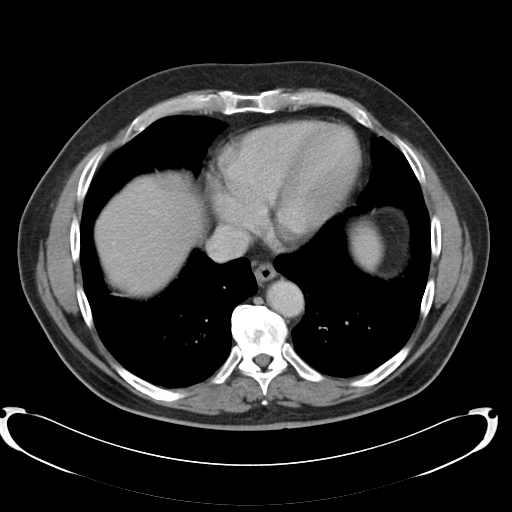

[Series 602: <mpr thick range> · coronal · 0.91mm/px · 3 of 96 slices shown]
[im 32/96  soft-tissue]
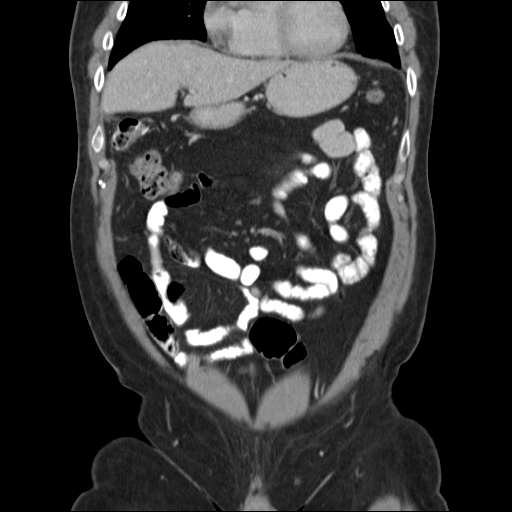
[im 43/96  soft-tissue]
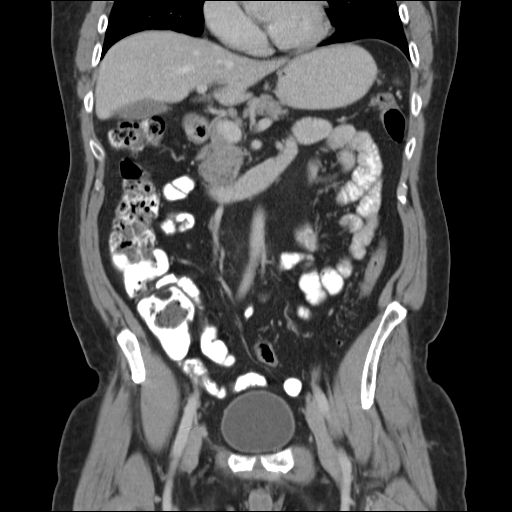
[im 53/96  soft-tissue]
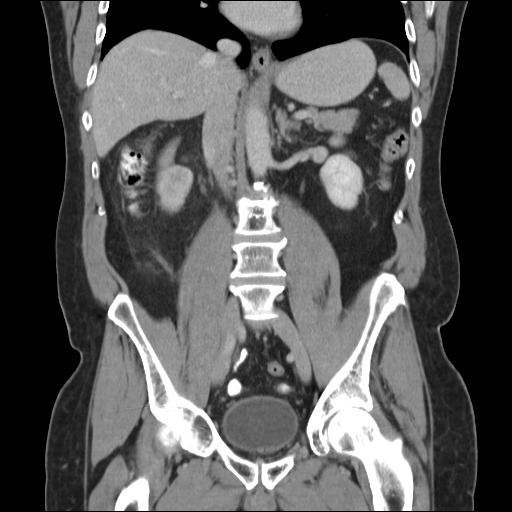

[17 of 46 positions shown; findings below may reference images not displayed]

FINDINGS: The lung bases are clear.  Liver, spleen, pancreas, and
adrenals normal.  No calcified gallstones or bile duct dilatation.
No adenopathy or ascites.

There are no renal calculi.  However, there is evidence for
obstructive uropathy on the right, with delay in function and
dilatation of the ureter.  There are multiple peripelvic cysts of
the right kidney.   Graft visualized bowel normal.
IMPRESSION: Normal except for findings consistent with obstructive uropathy on
the right - see report

CT PELVIS
FINDINGS: A mildly dilated ureter can be followed down distally.
There are two stones in the right distal ureter in the 4-5 mm size
range.  There is a third stone at the right UVJ, measuring about 5
mm. The appendix is not visualized in any plane.  There are no
inflammatory changes in the region of the appendix.  There are
prostate seed implants.  No adenopathy or fluid collections.
IMPRESSION: 1.  There are two right distal ureteral stones, and a stone at the
right UVJ, causing a mild to moderate obstructive uropathy.
2.  Status post prostate seed implants.
3.  No changes to suggest appendicitis.

## 2010-05-30 NOTE — Assessment & Plan Note (Signed)
[facility]                           PRIMARY CARE OFFICE NOTE   NAME:BROWNINGDeborra [name]                       MRN:          [id number]  DATE:02/01/2006                            DOB:          09/10/1940    The patient is a 70 year old male who presents for wellness examination.  Past medical history, family history, social history as per January 30, 2005 note.  In the meantime, his parents became old and ill, mother is  currently at a hospital, father is at home requiring at lot of help.  The patient's son moved in due to family problems, so Jeremy [name] has  been under a great deal of stress lately.  He continues to work full  time, traveling some.   CURRENT MEDICINES:  Reviewed with the patient.   ALLERGIES:  ASPIRIN, GI upset.   REVIEW OF SYSTEMS:  No chest pain or shortness of breath.  Denies being  depressed.  No urinary concerns.  The rest of the 18-point system review  is negative.   PHYSICAL EXAMINATION:  Blood pressure 143/80, pulse 67, temperature  97.9, weight 209 pounds.  He is in no acute distress.  HEENT:  Moist mucosa.  NECK:  Supple, no thyromegaly or bruit.  LUNGS:  Clear, no wheezes or rales.  HEART:  S1, S2, no murmur, no gallop.  ABDOMEN:  Soft, nontender, no organomegaly, no mass felt.  Lower extremities without edema.  SKIN:  With aging changes.  JOINTS:  Without deformities.  He is alert and cooperative.  RECTAL:  Examination is not done due to the fact that he gets it with  Dr. Isabel Caprice every 3 months.   LABS:  EKG with normal sinus rhythm, occasional PVCs.   ASSESSMENT AND PLAN:  1. Normal wellness examination.  Age/health-related issues discussed.      Healthy lifestyle discussed.  His last chest x-ray was done in      2007.  Given Pneumovax.  Obtain lab work in 3 months when he comes      for a follow up.  2. Prostate cancer.  He is a little under 2 years status post seeds      implant and radiation therapy.  He  has been seeing Dr. Isabel Caprice every      3 months.  3. Weight gain.  He will try to lose weight.  Obtain TSH.  4. Colon polyps, due colonoscopy in 2008.  5. Upper respiratory tract infection; given Z-Pak for 5 days.  6. Family stress, coping well.     Jeremy Quint. Plotnikov, MD  Electronically Signed    AVP/MedQ  DD: 02/02/2006  DT: 02/02/2006  Job #: [id number]   cc:   Valetta Fuller, M.D.

## 2010-05-30 NOTE — Op Note (Signed)
NAMEAJAHNI, NAY                ACCOUNT NO.:  [id number]   MEDICAL RECORD NO.:  [id number]          PATIENT TYPE:  AMB   LOCATION:  [facility]                         FACILITY:  [facility]   PHYSICIAN:  Valetta Fuller, M.D.  DATE OF BIRTH:  January 18, 1940   DATE OF PROCEDURE:  DATE OF DISCHARGE:                                 OPERATIVE REPORT   PREOPERATIVE DIAGNOSIS:  Clinical stage T1-C adenocarcinoma of prostate.   POSTOPERATIVE DIAGNOSIS:  Clinical stage T1-C adenocarcinoma of the  prostate.   PROCEDURES PERFORMED:  1.  Nucleotron Robotic I-125 prostate seed implantation.  2.  Flexible cystoscopy.   SURGEON:  Valetta Fuller, M.D.   ASSISTANT:  Maryln Gottron, M.D.   ANESTHESIA:  General.   INDICATIONS:  Mr. Fleek is a 70 year old male. He was provisionally  referred because of a minimally-elevated PSA of 3.6 which had been a  significant increase from his previous levels. Digital rectal exam was  unremarkable. The patient did have a significantly reduced PSA-2 reading.  The patient had a positive right-sided biopsy involving 25% of the biopsy  material which demonstrated a Gleason 3 + 4 = 7 tumor. The patient underwent  extensive consultation. He was offered radical retropubic prostatectomy  versus radiation approaches. The patient ended up seeing Dr. Dayton Scrape, and a  decision was made to treat him with external beam radiation therapy followed  by seed implantation boost. The patient has received of five weeks of  external beam radiation therapy which has been well tolerated. The patient  now presents for his seed implantation boost.   OPERATIVE TECHNIQUE AND FINDINGS:  The patient was brought to the operating  room where he was placed in a moderate lithotomy position and prepped and  draped in the usual manner. A Foley catheter was placed. A real-time  ultrasound unit was placed in the rectum, and the patient again was prepped  and draped in the usual manner. Anchoring  needles were placed in the  prostate through ultrasound guidance. We ended up utilizing real-time  planning, utilizing the Nucleotron Robotic seed implanter.  Dr. Dayton Scrape  performed real-time contouring of the prostate, urethra, and rectum. The  volume studies and contouring were reviewed in sagittal as well as  transverse dimensions. Once the groove contouring was complete, the  dosimetry was performed in real time. The plan was then followed by passing  each needle utilizing real-time ultrasonography in the sagittal plane. After  each needle passage, the Nucleotron seed implanter was attached, and the  seeds were delivered through the robotic aspect of the seed inserter. This  was done for a total of 18 needles, and 66 seeds were implanted. Each seed  had a dose of 0.311 mCi. The target dose was 90 gray. At the completion of  the procedure, the Foley catheter was removed, and cystoscopy  was performed. We saw no evidence of any seeds within the urethra or  bladder. A new Foley catheter was placed. Fluoroscopy was brought in, and a  KUB was obtained which showed excellent seed distribution. The patient  appeared to tolerate the procedure  well.  There were no obvious  complications.      DSG/MEDQ  D:  05/14/2004  T:  05/14/2004  Job:  [id number]   cc:   Maryln Gottron, M.D.  [address] [facility]  [place]  [place] [address]  Fax: [phone]   Georgina Quint. Plotnikov, M.D. [facility]

## 2010-09-22 ENCOUNTER — Ambulatory Visit (INDEPENDENT_AMBULATORY_CARE_PROVIDER_SITE_OTHER): Payer: Medicare Other | Admitting: Internal Medicine

## 2010-09-22 ENCOUNTER — Encounter: Payer: Self-pay | Admitting: Internal Medicine

## 2010-09-22 ENCOUNTER — Other Ambulatory Visit (INDEPENDENT_AMBULATORY_CARE_PROVIDER_SITE_OTHER): Payer: Medicare Other

## 2010-09-22 ENCOUNTER — Ambulatory Visit (INDEPENDENT_AMBULATORY_CARE_PROVIDER_SITE_OTHER)
Admission: RE | Admit: 2010-09-22 | Discharge: 2010-09-22 | Disposition: A | Discharge status: Home / Self Care | Payer: Medicare Other | Source: Ambulatory Visit | Attending: Internal Medicine | Admitting: Internal Medicine

## 2010-09-22 VITALS — BP 130/90 | HR 76 | Temp 97.9°F | Resp 16 | Wt 185.0 lb

## 2010-09-22 DIAGNOSIS — M25552 Pain in left hip: Secondary | ICD-10-CM

## 2010-09-22 DIAGNOSIS — M25559 Pain in unspecified hip: Secondary | ICD-10-CM

## 2010-09-22 DIAGNOSIS — Z136 Encounter for screening for cardiovascular disorders: Secondary | ICD-10-CM

## 2010-09-22 DIAGNOSIS — Z23 Encounter for immunization: Secondary | ICD-10-CM

## 2010-09-22 DIAGNOSIS — Z0000 Encounter for general adult medical examination without abnormal findings: Secondary | ICD-10-CM

## 2010-09-22 DIAGNOSIS — Z79899 Other long term (current) drug therapy: Secondary | ICD-10-CM

## 2010-09-22 LAB — CBC WITH DIFFERENTIAL/PLATELET
Eosinophils Absolute: 0.1 10*3/uL (ref 0.0–0.7)
Lymphs Abs: 1.5 10*3/uL (ref 0.7–4.0)
Monocytes Absolute: 0.4 10*3/uL (ref 0.1–1.0)
Monocytes Relative: 7.4 % (ref 3.0–12.0)
Platelets: 163 10*3/uL (ref 150.0–400.0)
RBC: 4.39 Mil/uL (ref 4.22–5.81)
RDW: 13 % (ref 11.5–14.6)

## 2010-09-22 LAB — URINALYSIS
Bilirubin Urine: NEGATIVE
Ketones, ur: NEGATIVE
Leukocytes, UA: NEGATIVE
Nitrite: NEGATIVE
Specific Gravity, Urine: 1.025 (ref 1.000–1.030)
Total Protein, Urine: NEGATIVE

## 2010-09-22 LAB — COMPREHENSIVE METABOLIC PANEL
AST: 25 U/L (ref 0–37)
Alkaline Phosphatase: 66 U/L (ref 39–117)
BUN: 16 mg/dL (ref 6–23)
Calcium: 8.9 mg/dL (ref 8.4–10.5)
GFR: 120.35 mL/min (ref >60.00)
Glucose, Bld: 108 mg/dL — ABNORMAL HIGH (ref 70–99)
Potassium: 3.9 mEq/L (ref 3.5–5.1)
Total Protein: 7 g/dL (ref 6.0–8.3)

## 2010-09-22 LAB — TSH: TSH: 1.4 u[IU]/mL (ref 0.35–5.50)

## 2010-09-22 LAB — LIPID PANEL
HDL: 43.7 mg/dL (ref >39.00)
Total CHOL/HDL Ratio: 3
Triglycerides: 54 mg/dL (ref 0.0–149.0)

## 2010-09-22 NOTE — Progress Notes (Signed)
  Subjective:    Patient ID: Jeremy [name], male    DOB: [birth year], 70 y.o.   MRN: [id number]  HPI  The patient is here for a wellness exam. The patient has been doing well overall without major physical or psychological issues going on lately.  Review of Systems  Constitutional: Negative for fever, appetite change, fatigue and unexpected weight change.  HENT: Negative for hearing loss, nosebleeds, congestion, sore throat, sneezing, trouble swallowing and neck pain.   Eyes: Negative for itching and visual disturbance.  Respiratory: Negative for cough and chest tightness.   Cardiovascular: Negative for chest pain, palpitations and leg swelling.  Gastrointestinal: Negative for nausea, diarrhea, blood in stool and abdominal distention.  Genitourinary: Negative for frequency, hematuria, penile swelling and testicular pain.  Musculoskeletal: Negative for back pain, joint swelling and gait problem.       L upper hip hurts at night x 2 mo  Skin: Negative for rash.  Neurological: Negative for dizziness, tremors, speech difficulty and weakness.  Psychiatric/Behavioral: Negative for suicidal ideas, hallucinations, sleep disturbance, dysphoric mood and agitation. The patient is not nervous/anxious.        Objective:   Physical Exam  Constitutional: He is oriented to person, place, and time. He appears well-developed and well-nourished. No distress.  HENT:  Head: Normocephalic and atraumatic.  Right Ear: External ear normal.  Left Ear: External ear normal.  Nose: Nose normal.  Mouth/Throat: Oropharynx is clear and moist. No oropharyngeal exudate.  Eyes: Conjunctivae and EOM are normal. Pupils are equal, round, and reactive to light. Right eye exhibits no discharge. Left eye exhibits no discharge. No scleral icterus.  Neck: Normal range of motion. Neck supple. No JVD present. No tracheal deviation present. No thyromegaly present.  Cardiovascular: Normal rate, regular rhythm, normal heart sounds  and intact distal pulses.  Exam reveals no gallop and no friction rub.   No murmur heard. Pulmonary/Chest: Effort normal and breath sounds normal. No stridor. No respiratory distress. He has no wheezes. He has no rales. He exhibits no tenderness.  Abdominal: Soft. Bowel sounds are normal. He exhibits no distension and no mass. There is no tenderness. There is no rebound and no guarding.  Genitourinary:       Rect def to urol  Musculoskeletal: Normal range of motion. He exhibits no edema and no tenderness.  Lymphadenopathy:    He has no cervical adenopathy.  Neurological: He is alert and oriented to person, place, and time. He has normal reflexes. No cranial nerve deficit. He exhibits normal muscle tone. Coordination normal.  Skin: Skin is warm and dry. No rash noted. He is not diaphoretic. No erythema. No pallor.  Psychiatric: He has a normal mood and affect. His behavior is normal. Judgment and thought content normal.   Rectal is def to Dr Isabel Caprice L iliac crest is tender  Lab Results  Component Value Date   WBC 4.9 08/19/2009   HGB 15.8 08/19/2009   HCT 44.3 08/19/2009   PLT 183.0 08/19/2009   CHOL 153 08/19/2009   TRIG 73.0 08/19/2009   HDL 34.40* 08/19/2009   ALT 31 08/19/2009   AST 24 08/19/2009   NA 143 08/19/2009   K 4.5 08/19/2009   CL 107 08/19/2009   CREATININE 0.8 08/19/2009   BUN 17 08/19/2009   CO2 30 08/19/2009   TSH 1.51 08/19/2009   PSA 0.06* 08/19/2009   INR 1.0 ratio 03/27/2008        Assessment & Plan:

## 2010-09-22 NOTE — Assessment & Plan Note (Signed)
Check x ray Stretch

## 2010-09-22 NOTE — Assessment & Plan Note (Signed)
We discussed age appropriate health related issues, including available/recomended screening tests and vaccinations. We discussed a need for adhering to healthy diet and exercise. Labs/EKG were reviewed/ordered. All questions were answered.   

## 2010-09-24 ENCOUNTER — Telehealth: Payer: Self-pay | Admitting: Internal Medicine

## 2010-09-24 ENCOUNTER — Telehealth: Payer: Self-pay | Admitting: *Deleted

## 2010-09-24 NOTE — Telephone Encounter (Signed)
Per pt request mailed copy of recent labs to him.

## 2010-09-24 NOTE — Telephone Encounter (Signed)
Pt informed

## 2010-09-24 NOTE — Telephone Encounter (Signed)
Jeremy [name], please, inform patient that pelvis xray was ok Thx

## 2011-03-03 ENCOUNTER — Other Ambulatory Visit: Payer: Self-pay | Admitting: *Deleted

## 2011-03-03 MED ORDER — FLUTICASONE PROPIONATE 50 MCG/ACT NA SUSP: 1.0000 | Freq: Every day | NASAL | Status: DC

## 2011-09-23 ENCOUNTER — Encounter: Payer: Medicare Other | Admitting: Internal Medicine

## 2011-10-08 ENCOUNTER — Other Ambulatory Visit (INDEPENDENT_AMBULATORY_CARE_PROVIDER_SITE_OTHER): Payer: Medicare Other

## 2011-10-08 ENCOUNTER — Ambulatory Visit (INDEPENDENT_AMBULATORY_CARE_PROVIDER_SITE_OTHER): Payer: Medicare Other | Admitting: Internal Medicine

## 2011-10-08 ENCOUNTER — Encounter: Payer: Self-pay | Admitting: Internal Medicine

## 2011-10-08 VITALS — BP 142/84 | HR 72 | Temp 97.3°F | Resp 16 | Ht 68.5 in | Wt 183.0 lb

## 2011-10-08 DIAGNOSIS — Z23 Encounter for immunization: Secondary | ICD-10-CM

## 2011-10-08 DIAGNOSIS — E785 Hyperlipidemia, unspecified: Secondary | ICD-10-CM

## 2011-10-08 DIAGNOSIS — M25559 Pain in unspecified hip: Secondary | ICD-10-CM

## 2011-10-08 DIAGNOSIS — J309 Allergic rhinitis, unspecified: Secondary | ICD-10-CM

## 2011-10-08 DIAGNOSIS — Z0000 Encounter for general adult medical examination without abnormal findings: Secondary | ICD-10-CM

## 2011-10-08 DIAGNOSIS — Z79899 Other long term (current) drug therapy: Secondary | ICD-10-CM

## 2011-10-08 DIAGNOSIS — Z136 Encounter for screening for cardiovascular disorders: Secondary | ICD-10-CM

## 2011-10-08 DIAGNOSIS — M25552 Pain in left hip: Secondary | ICD-10-CM

## 2011-10-08 LAB — BASIC METABOLIC PANEL
CO2: 26 mEq/L (ref 19–32)
Chloride: 105 mEq/L (ref 96–112)
Glucose, Bld: 100 mg/dL — ABNORMAL HIGH (ref 70–99)
Potassium: 4.4 mEq/L (ref 3.5–5.1)
Sodium: 138 mEq/L (ref 135–145)

## 2011-10-08 LAB — URINALYSIS
Hgb urine dipstick: NEGATIVE
Nitrite: NEGATIVE
Specific Gravity, Urine: 1.015 (ref 1.000–1.030)
Total Protein, Urine: NEGATIVE
Urine Glucose: NEGATIVE
pH: 7 (ref 5.0–8.0)

## 2011-10-08 LAB — CBC WITH DIFFERENTIAL/PLATELET
Basophils Relative: 0.7 % (ref 0.0–3.0)
Eosinophils Absolute: 0.2 10*3/uL (ref 0.0–0.7)
Eosinophils Relative: 5.5 % — ABNORMAL HIGH (ref 0.0–5.0)
Hemoglobin: 15.1 g/dL (ref 13.0–17.0)
Lymphocytes Relative: 43.7 % (ref 12.0–46.0)
MCHC: 33.7 g/dL (ref 30.0–36.0)
MCV: 101.7 fl — ABNORMAL HIGH (ref 78.0–100.0)
Neutro Abs: 1.9 10*3/uL (ref 1.4–7.7)
RBC: 4.41 Mil/uL (ref 4.22–5.81)
WBC: 4.6 10*3/uL (ref 4.5–10.5)

## 2011-10-08 LAB — HEPATIC FUNCTION PANEL
Bilirubin, Direct: 0.1 mg/dL (ref 0.0–0.3)
Total Bilirubin: 0.8 mg/dL (ref 0.3–1.2)
Total Protein: 6.9 g/dL (ref 6.0–8.3)

## 2011-10-08 LAB — LIPID PANEL
HDL: 40.8 mg/dL (ref >39.00)
LDL Cholesterol: 105 mg/dL — ABNORMAL HIGH (ref 0–99)
Total CHOL/HDL Ratio: 4
Triglycerides: 51 mg/dL (ref 0.0–149.0)
VLDL: 10.2 mg/dL (ref 0.0–40.0)

## 2011-10-08 MED ORDER — LORATADINE 10 MG PO TABS: 10.0000 mg | ORAL_TABLET | Freq: Every day | ORAL | Status: DC

## 2011-10-08 NOTE — Assessment & Plan Note (Signed)
Resolved

## 2011-10-08 NOTE — Assessment & Plan Note (Signed)
labs

## 2011-10-08 NOTE — Progress Notes (Signed)
Patient ID: Jeremy [name], male   DOB: [birth year], 71 y.o.   MRN: [id number]  Subjective:    Patient ID: Jeremy [name], male    DOB: [birth year], 71 y.o.   MRN: [id number]  HPI  The patient is here for a wellness exam. The patient has been doing well overall without major physical or psychological issues going on lately.  Review of Systems  Constitutional: Negative for fever, appetite change, fatigue and unexpected weight change.  HENT: Negative for hearing loss, nosebleeds, congestion, sore throat, sneezing, trouble swallowing and neck pain.   Eyes: Negative for itching and visual disturbance.  Respiratory: Negative for cough and chest tightness.   Cardiovascular: Negative for chest pain, palpitations and leg swelling.  Gastrointestinal: Negative for nausea, diarrhea, blood in stool and abdominal distention.  Genitourinary: Negative for frequency, hematuria, penile swelling and testicular pain.  Musculoskeletal: Negative for back pain, joint swelling and gait problem.       L upper hip hurts at night x 2 mo  Skin: Negative for rash.  Neurological: Negative for dizziness, tremors, speech difficulty and weakness.  Psychiatric/Behavioral: Negative for suicidal ideas, hallucinations, disturbed wake/sleep cycle, dysphoric mood and agitation. The patient is not nervous/anxious.        Objective:   Physical Exam  Constitutional: He is oriented to person, place, and time. He appears well-developed and well-nourished. No distress.  HENT:  Head: Normocephalic and atraumatic.  Right Ear: External ear normal.  Left Ear: External ear normal.  Nose: Nose normal.  Mouth/Throat: Oropharynx is clear and moist. No oropharyngeal exudate.  Eyes: Conjunctivae normal and EOM are normal. Pupils are equal, round, and reactive to light. Right eye exhibits no discharge. Left eye exhibits no discharge. No scleral icterus.  Neck: Normal range of motion. Neck supple. No JVD present. No tracheal deviation  present. No thyromegaly present.  Cardiovascular: Normal rate, regular rhythm, normal heart sounds and intact distal pulses.  Exam reveals no gallop and no friction rub.   No murmur heard. Pulmonary/Chest: Effort normal and breath sounds normal. No stridor. No respiratory distress. He has no wheezes. He has no rales. He exhibits no tenderness.  Abdominal: Soft. Bowel sounds are normal. He exhibits no distension and no mass. There is no tenderness. There is no rebound and no guarding.  Genitourinary:       Rect def to urol  Musculoskeletal: Normal range of motion. He exhibits no edema and no tenderness.  Lymphadenopathy:    He has no cervical adenopathy.  Neurological: He is alert and oriented to person, place, and time. He has normal reflexes. No cranial nerve deficit. He exhibits normal muscle tone. Coordination normal.  Skin: Skin is warm and dry. No rash noted. He is not diaphoretic. No erythema. No pallor.  Psychiatric: He has a normal mood and affect. His behavior is normal. Judgment and thought content normal.   Rectal is def to Dr Isabel Caprice L iliac crest is tender  Lab Results  Component Value Date   WBC 4.9 09/22/2010   HGB 15.2 09/22/2010   HCT 44.7 09/22/2010   PLT 163.0 09/22/2010   CHOL 141 09/22/2010   TRIG 54.0 09/22/2010   HDL 43.70 09/22/2010   ALT 23 09/22/2010   AST 25 09/22/2010   NA 140 09/22/2010   K 3.9 09/22/2010   CL 108 09/22/2010   CREATININE 0.7 09/22/2010   BUN 16 09/22/2010   CO2 26 09/22/2010   TSH 1.40 09/22/2010   PSA 0.06* 08/19/2009   INR  1.0 ratio 03/27/2008        Assessment & Plan:

## 2011-10-08 NOTE — Assessment & Plan Note (Signed)
Continue with current prescription therapy as reflected on the Med list.  

## 2011-10-08 NOTE — Assessment & Plan Note (Signed)
We discussed age appropriate health related issues, including available/recomended screening tests and vaccinations. We discussed a need for adhering to healthy diet and exercise. Labs/EKG were reviewed/ordered. All questions were answered.   

## 2012-01-08 ENCOUNTER — Ambulatory Visit (INDEPENDENT_AMBULATORY_CARE_PROVIDER_SITE_OTHER): Payer: Medicare Other | Admitting: Internal Medicine

## 2012-01-08 ENCOUNTER — Encounter: Payer: Self-pay | Admitting: Internal Medicine

## 2012-01-08 VITALS — BP 140/80 | HR 80 | Temp 96.9°F | Resp 16 | Wt 185.0 lb

## 2012-01-08 DIAGNOSIS — L309 Dermatitis, unspecified: Secondary | ICD-10-CM

## 2012-01-08 DIAGNOSIS — L259 Unspecified contact dermatitis, unspecified cause: Secondary | ICD-10-CM

## 2012-01-08 MED ORDER — TRIAMCINOLONE ACETONIDE 0.1 % EX CREA: TOPICAL_CREAM | Freq: Two times a day (BID) | CUTANEOUS | Status: DC

## 2012-01-08 MED ORDER — HYDROXYZINE HCL 10 MG PO TABS: 10.0000 mg | ORAL_TABLET | Freq: Three times a day (TID) | ORAL | Status: DC | PRN

## 2012-01-08 NOTE — Progress Notes (Signed)
Subjective:    Patient ID: Jeremy [name], male    DOB: [birth year], 71 y.o.   MRN: [id number]  HPI  Pt presents to the clinic today with c/o rash. This started approximately 1 month ago on the right side of his chest. It is very itchy, but not painful. He has no history of eczema or psoriasis. He does work out at [employer] and uses the communal hot tub. He has not applied anything to the rash OTC and has not taken anything for the itching.  Review of Systems  Past Medical History  Diagnosis Date  . Prostate cancer     hx of s/p XRT, seeds Dr. Isabel Caprice  . AK (actinic keratosis)   . Osteopenia   . Allergic rhinitis   . History of colonic polyps     Dr. Christella Hartigan- Due 2018  . History of skin cancer     Current Outpatient Prescriptions  Medication Sig Dispense Refill  . Cholecalciferol 1000 UNITS tablet Take 1,000 Units by mouth daily.        . fluticasone (FLONASE) 50 MCG/ACT nasal spray Place 1 spray into the nose daily.  16 g  3  . loratadine (CLARITIN) 10 MG tablet Take 1 tablet (10 mg total) by mouth daily.  90 tablet  3  . Omega-3 Fatty Acids (FISH OIL) 1000 MG CAPS Take 1 capsule by mouth daily.          Allergies  Allergen Reactions  . Aspirin     REACTION: gi  . Capsaicin     REACTION: as needed  . Naproxen     REACTION: as needed    Family History  Problem Relation Age of Onset  . Liver disease Father   . Cancer Father     pancreas    History   Social History  . Marital Status: Married    Spouse Name: N/A    Number of Children: N/A  . Years of Education: N/A   Occupational History  . Not on file.   Social History Main Topics  . Smoking status: Current Some Day Smoker    Types: Cigars  . Smokeless tobacco: Not on file  . Alcohol Use: 0.6 oz/week    1 Cans of beer per week  . Drug Use: No  . Sexually Active: Yes   Other Topics Concern  . Not on file   Social History Narrative  . No narrative on file     Constitutional: Denies fever, malaise,  fatigue, headache or abrupt weight changes.  Respiratory: Denies difficulty breathing, shortness of breath, cough or sputum production.   Skin: Pt reports rash to upper chest. Denies redness, lesions or ulcercations.    No other specific complaints in a complete review of systems (except as listed in HPI above).     Objective:   Physical Exam   BP 140/80  Pulse 80  Temp 96.9 F (36.1 C) (Oral)  Resp 16  Wt 185 lb (83.915 kg) Wt Readings from Last 3 Encounters:  01/08/12 185 lb (83.915 kg)  10/08/11 183 lb (83.008 kg)  09/22/10 185 lb (83.915 kg)    General: Appears his stated age, well developed, well nourished in NAD. Skin: Dry scaly rash noted on the anterior right chest on a erythematous base. Cardiovascular: Normal rate and rhythm. S1,S2 noted.  No murmur, rubs or gallops noted. No JVD or BLE edema. No carotid bruits noted. Pulmonary/Chest: Normal effort and positive vesicular breath sounds. No respiratory distress. No wheezes,  rales or ronchi noted.        Assessment & Plan:   Eczema, new onset with additional workup required:  eRx for Triamcinolone cream eRx for Atarax for itching Stay away from hot tubs and hot showers Apply a moisturizer like Eucerin after bathing  RTC as needed or if symptoms persist

## 2012-01-08 NOTE — Patient Instructions (Addendum)

## 2012-04-12 ENCOUNTER — Other Ambulatory Visit: Payer: Self-pay | Admitting: Internal Medicine

## 2012-04-13 ENCOUNTER — Other Ambulatory Visit: Payer: Self-pay | Admitting: *Deleted

## 2012-04-13 MED ORDER — FLUTICASONE PROPIONATE 50 MCG/ACT NA SUSP: 1.0000 | Freq: Every day | NASAL | Status: DC

## 2012-04-14 ENCOUNTER — Encounter: Payer: Self-pay | Admitting: Internal Medicine

## 2012-04-14 ENCOUNTER — Ambulatory Visit (INDEPENDENT_AMBULATORY_CARE_PROVIDER_SITE_OTHER): Payer: Medicare Other | Admitting: Internal Medicine

## 2012-04-14 VITALS — BP 120/84 | HR 72 | Temp 97.2°F | Resp 16 | Wt 186.0 lb

## 2012-04-14 DIAGNOSIS — J310 Chronic rhinitis: Secondary | ICD-10-CM

## 2012-04-14 DIAGNOSIS — J019 Acute sinusitis, unspecified: Secondary | ICD-10-CM

## 2012-04-14 MED ORDER — AZITHROMYCIN 250 MG PO TABS: ORAL_TABLET | ORAL | Status: DC

## 2012-04-14 MED ORDER — METHYLPREDNISOLONE ACETATE 80 MG/ML IJ SUSP
80.0000 mg | Freq: Once | INTRAMUSCULAR | Status: AC
  Administered 2012-04-14: 80 mg via INTRAMUSCULAR

## 2012-04-14 NOTE — Assessment & Plan Note (Signed)
Seasonal  4/13 Depo-medrol 80 mg im

## 2012-04-14 NOTE — Progress Notes (Signed)
Subjective:    Sinusitis This is a new problem. The current episode started 1 to 4 weeks ago. The problem has been waxing and waning since onset. There has been no fever. The pain is mild. Associated symptoms include congestion, sinus pressure, sneezing and swollen glands. Pertinent negatives include no coughing, neck pain or sore throat. Past treatments include spray decongestants and saline sprays. The treatment provided mild relief.      Review of Systems  Constitutional: Negative for fever, appetite change, fatigue and unexpected weight change.  HENT: Positive for congestion, sneezing and sinus pressure. Negative for hearing loss, nosebleeds, sore throat, trouble swallowing and neck pain.   Eyes: Negative for itching and visual disturbance.  Respiratory: Negative for cough and chest tightness.   Cardiovascular: Negative for chest pain, palpitations and leg swelling.  Gastrointestinal: Negative for nausea, diarrhea, blood in stool and abdominal distention.  Genitourinary: Negative for frequency, hematuria, penile swelling and testicular pain.  Musculoskeletal: Negative for back pain, joint swelling and gait problem.       L upper hip hurts at night x 2 mo  Skin: Negative for rash.  Neurological: Negative for dizziness, tremors, speech difficulty and weakness.  Psychiatric/Behavioral: Negative for suicidal ideas, hallucinations, sleep disturbance, dysphoric mood and agitation. The patient is not nervous/anxious.        Objective:   Physical Exam  Constitutional: He is oriented to person, place, and time. He appears well-developed and well-nourished. No distress.  HENT:  Head: Normocephalic and atraumatic.  Right Ear: External ear normal.  Left Ear: External ear normal.  Nose: Nose normal.  Mouth/Throat: Oropharynx is clear and moist. No oropharyngeal exudate.  Nasal mucosa - erythema  Eyes: Conjunctivae and EOM are normal. Pupils are equal, round, and reactive to light. Right  eye exhibits no discharge. Left eye exhibits no discharge. No scleral icterus.  Neck: Normal range of motion. Neck supple. No JVD present. No tracheal deviation present. No thyromegaly present.  Cardiovascular: Normal rate, regular rhythm, normal heart sounds and intact distal pulses.  Exam reveals no gallop and no friction rub.   No murmur heard. Pulmonary/Chest: Effort normal and breath sounds normal. No stridor. No respiratory distress. He has no wheezes. He has no rales. He exhibits no tenderness.  Abdominal: Soft. Bowel sounds are normal. He exhibits no distension and no mass. There is no tenderness. There is no rebound and no guarding.  Musculoskeletal: Normal range of motion. He exhibits no edema and no tenderness.  Lymphadenopathy:    He has no cervical adenopathy.  Neurological: He is alert and oriented to person, place, and time. He has normal reflexes. No cranial nerve deficit. He exhibits normal muscle tone. Coordination normal.  Skin: Skin is warm and dry. No rash noted. He is not diaphoretic. No erythema. No pallor.  Psychiatric: He has a normal mood and affect. His behavior is normal. Judgment and thought content normal.   Rectal is def to Dr Isabel Caprice L iliac crest is tender  Lab Results  Component Value Date   WBC 4.6 10/08/2011   HGB 15.1 10/08/2011   HCT 44.9 10/08/2011   PLT 184.0 10/08/2011   CHOL 156 10/08/2011   TRIG 51.0 10/08/2011   HDL 40.80 10/08/2011   ALT 22 10/08/2011   AST 22 10/08/2011   NA 138 10/08/2011   K 4.4 10/08/2011   CL 105 10/08/2011   CREATININE 0.9 10/08/2011   BUN 14 10/08/2011   CO2 26 10/08/2011   TSH 1.54 10/08/2011   PSA  0.06* 08/19/2009   INR 1.0 ratio 03/27/2008        Assessment & Plan:

## 2012-04-14 NOTE — Assessment & Plan Note (Signed)
Zpac 

## 2012-04-19 ENCOUNTER — Encounter: Payer: Self-pay | Admitting: Internal Medicine

## 2012-04-19 ENCOUNTER — Ambulatory Visit (INDEPENDENT_AMBULATORY_CARE_PROVIDER_SITE_OTHER): Payer: Medicare Other | Admitting: Internal Medicine

## 2012-04-19 VITALS — BP 130/80 | HR 80 | Temp 97.1°F | Resp 16 | Wt 187.0 lb

## 2012-04-19 DIAGNOSIS — J309 Allergic rhinitis, unspecified: Secondary | ICD-10-CM

## 2012-04-19 DIAGNOSIS — J019 Acute sinusitis, unspecified: Secondary | ICD-10-CM

## 2012-04-19 MED ORDER — PREDNISONE 10 MG PO TABS: ORAL_TABLET | ORAL | Status: DC

## 2012-04-19 MED ORDER — AMOXICILLIN-POT CLAVULANATE 875-125 MG PO TABS: 1.0000 | ORAL_TABLET | Freq: Two times a day (BID) | ORAL | Status: DC

## 2012-04-19 NOTE — Assessment & Plan Note (Signed)
Worse Augmentin CT if not better

## 2012-04-19 NOTE — Assessment & Plan Note (Signed)
orse Prednisone 10 mg: take 4 tabs a day x 3 days; then 3 tabs a day x 4 days; then 2 tabs a day x 4 days, then 1 tab a day x 6 days, then stop. Take pc.

## 2012-04-19 NOTE — Progress Notes (Signed)
Subjective:    Sinusitis This is a new problem. The current episode started 1 to 4 weeks ago. The problem has been rapidly worsening since onset. There has been no fever. The pain is mild. Associated symptoms include congestion, sinus pressure, sneezing and swollen glands. Pertinent negatives include no coughing, neck pain or sore throat. Past treatments include spray decongestants, saline sprays, acetaminophen and antibiotics. The treatment provided no relief.      Review of Systems  Constitutional: Negative for fever, appetite change, fatigue and unexpected weight change.  HENT: Positive for congestion, sneezing and sinus pressure. Negative for hearing loss, nosebleeds, sore throat, trouble swallowing and neck pain.   Eyes: Negative for itching and visual disturbance.  Respiratory: Negative for cough and chest tightness.   Cardiovascular: Negative for chest pain, palpitations and leg swelling.  Gastrointestinal: Negative for nausea, diarrhea, blood in stool and abdominal distention.  Genitourinary: Negative for frequency, hematuria, penile swelling and testicular pain.  Musculoskeletal: Negative for back pain, joint swelling and gait problem.       L upper hip hurts at night x 2 mo  Skin: Negative for rash.  Neurological: Negative for dizziness, tremors, speech difficulty and weakness.  Psychiatric/Behavioral: Negative for suicidal ideas, hallucinations, sleep disturbance, dysphoric mood and agitation. The patient is not nervous/anxious.        Objective:   Physical Exam  Constitutional: He is oriented to person, place, and time. He appears well-developed and well-nourished. No distress.  HENT:  Head: Normocephalic and atraumatic.  Right Ear: External ear normal.  Left Ear: External ear normal.  Nose: Nose normal.  Mouth/Throat: Oropharynx is clear and moist. No oropharyngeal exudate.  Nasal mucosa - erythema  Eyes: Conjunctivae and EOM are normal. Pupils are equal, round,  and reactive to light. Right eye exhibits no discharge. Left eye exhibits no discharge. No scleral icterus.  Neck: Normal range of motion. Neck supple. No JVD present. No tracheal deviation present. No thyromegaly present.  Cardiovascular: Normal rate, regular rhythm, normal heart sounds and intact distal pulses.  Exam reveals no gallop and no friction rub.   No murmur heard. Pulmonary/Chest: Effort normal and breath sounds normal. No stridor. No respiratory distress. He has no wheezes. He has no rales. He exhibits no tenderness.  Abdominal: Soft. Bowel sounds are normal. He exhibits no distension and no mass. There is no tenderness. There is no rebound and no guarding.  Musculoskeletal: Normal range of motion. He exhibits no edema and no tenderness.  Lymphadenopathy:    He has no cervical adenopathy.  Neurological: He is alert and oriented to person, place, and time. He has normal reflexes. No cranial nerve deficit. He exhibits normal muscle tone. Coordination normal.  Skin: Skin is warm and dry. No rash noted. He is not diaphoretic. No erythema. No pallor.  Psychiatric: He has a normal mood and affect. His behavior is normal. Judgment and thought content normal.   Rectal is def to Dr Isabel Caprice L iliac crest is tender  Lab Results  Component Value Date   WBC 4.6 10/08/2011   HGB 15.1 10/08/2011   HCT 44.9 10/08/2011   PLT 184.0 10/08/2011   CHOL 156 10/08/2011   TRIG 51.0 10/08/2011   HDL 40.80 10/08/2011   ALT 22 10/08/2011   AST 22 10/08/2011   NA 138 10/08/2011   K 4.4 10/08/2011   CL 105 10/08/2011   CREATININE 0.9 10/08/2011   BUN 14 10/08/2011   CO2 26 10/08/2011   TSH 1.54 10/08/2011  PSA 0.06* 08/19/2009   INR 1.0 ratio 03/27/2008        Assessment & Plan:

## 2012-06-27 ENCOUNTER — Encounter: Payer: Self-pay | Admitting: Internal Medicine

## 2012-06-27 ENCOUNTER — Ambulatory Visit (INDEPENDENT_AMBULATORY_CARE_PROVIDER_SITE_OTHER): Payer: Medicare Other | Admitting: Internal Medicine

## 2012-06-27 VITALS — BP 152/98 | HR 80 | Temp 98.2°F | Resp 16 | Wt 187.0 lb

## 2012-06-27 DIAGNOSIS — R21 Rash and other nonspecific skin eruption: Secondary | ICD-10-CM | POA: Insufficient documentation

## 2012-06-27 MED ORDER — TRIAMCINOLONE ACETONIDE 0.5 % EX CREA: TOPICAL_CREAM | Freq: Three times a day (TID) | CUTANEOUS | Status: DC

## 2012-06-27 MED ORDER — METHYLPREDNISOLONE ACETATE 80 MG/ML IJ SUSP
80.0000 mg | Freq: Once | INTRAMUSCULAR | Status: AC
  Administered 2012-06-27: 80 mg via INTRAMUSCULAR

## 2012-06-27 NOTE — Assessment & Plan Note (Signed)
Triamc cream Depomedrol 80 mg im

## 2012-06-27 NOTE — Progress Notes (Signed)
   Subjective:    Rash This is a new problem. The current episode started in the past 7 days. The problem has been gradually improving since onset. The affected locations include the left lower leg and right lower leg. The rash is characterized by redness and swelling. He was exposed to nothing. Pertinent negatives include no diarrhea, fatigue or fever.      Review of Systems  Constitutional: Negative for fever, appetite change, fatigue and unexpected weight change.  HENT: Negative for hearing loss, nosebleeds and trouble swallowing.   Eyes: Negative for itching and visual disturbance.  Respiratory: Negative for chest tightness.   Cardiovascular: Negative for chest pain, palpitations and leg swelling.  Gastrointestinal: Negative for nausea, diarrhea, blood in stool and abdominal distention.  Genitourinary: Negative for frequency, hematuria, penile swelling and testicular pain.  Musculoskeletal: Negative for back pain, joint swelling and gait problem.       L upper hip hurts at night x 2 mo  Skin: Positive for rash.  Neurological: Negative for dizziness, tremors, speech difficulty and weakness.  Psychiatric/Behavioral: Negative for suicidal ideas, hallucinations, sleep disturbance, dysphoric mood and agitation. The patient is not nervous/anxious.        Objective:   Physical Exam  Constitutional: He is oriented to person, place, and time. He appears well-developed and well-nourished. No distress.  HENT:  Head: Normocephalic and atraumatic.  Right Ear: External ear normal.  Left Ear: External ear normal.  Nose: Nose normal.  Mouth/Throat: Oropharynx is clear and moist. No oropharyngeal exudate.  Nasal mucosa - erythema  Eyes: Conjunctivae and EOM are normal. Pupils are equal, round, and reactive to light. Right eye exhibits no discharge. Left eye exhibits no discharge. No scleral icterus.  Neck: Normal range of motion. Neck supple. No JVD present. No tracheal deviation present. No  thyromegaly present.  Cardiovascular: Normal rate, regular rhythm, normal heart sounds and intact distal pulses.  Exam reveals no gallop and no friction rub.   No murmur heard. Pulmonary/Chest: Effort normal and breath sounds normal. No stridor. No respiratory distress. He has no wheezes. He has no rales. He exhibits no tenderness.  Abdominal: Soft. Bowel sounds are normal. He exhibits no distension and no mass. There is no tenderness. There is no rebound and no guarding.  Musculoskeletal: Normal range of motion. He exhibits no edema and no tenderness.  Lymphadenopathy:    He has no cervical adenopathy.  Neurological: He is alert and oriented to person, place, and time. He has normal reflexes. No cranial nerve deficit. He exhibits normal muscle tone. Coordination normal.  Skin: Skin is warm and dry. No rash noted. He is not diaphoretic. No erythema. No pallor.  Psychiatric: He has a normal mood and affect. His behavior is normal. Judgment and thought content normal.     Lab Results  Component Value Date   WBC 4.6 10/08/2011   HGB 15.1 10/08/2011   HCT 44.9 10/08/2011   PLT 184.0 10/08/2011   CHOL 156 10/08/2011   TRIG 51.0 10/08/2011   HDL 40.80 10/08/2011   ALT 22 10/08/2011   AST 22 10/08/2011   NA 138 10/08/2011   K 4.4 10/08/2011   CL 105 10/08/2011   CREATININE 0.9 10/08/2011   BUN 14 10/08/2011   CO2 26 10/08/2011   TSH 1.54 10/08/2011   PSA 0.06* 08/19/2009   INR 1.0 ratio 03/27/2008        Assessment & Plan:

## 2012-06-28 ENCOUNTER — Encounter: Payer: Self-pay | Admitting: Internal Medicine

## 2012-10-11 ENCOUNTER — Encounter: Payer: Medicare Other | Admitting: Internal Medicine

## 2012-10-14 ENCOUNTER — Ambulatory Visit (INDEPENDENT_AMBULATORY_CARE_PROVIDER_SITE_OTHER): Payer: Medicare Other | Admitting: Internal Medicine

## 2012-10-14 ENCOUNTER — Encounter: Payer: Self-pay | Admitting: *Deleted

## 2012-10-14 ENCOUNTER — Other Ambulatory Visit (INDEPENDENT_AMBULATORY_CARE_PROVIDER_SITE_OTHER): Payer: Medicare Other

## 2012-10-14 ENCOUNTER — Encounter: Payer: Self-pay | Admitting: Internal Medicine

## 2012-10-14 VITALS — BP 146/84 | HR 64 | Temp 96.8°F | Resp 12 | Ht 68.0 in | Wt 186.0 lb

## 2012-10-14 DIAGNOSIS — Z1211 Encounter for screening for malignant neoplasm of colon: Secondary | ICD-10-CM

## 2012-10-14 DIAGNOSIS — E785 Hyperlipidemia, unspecified: Secondary | ICD-10-CM

## 2012-10-14 DIAGNOSIS — J309 Allergic rhinitis, unspecified: Secondary | ICD-10-CM

## 2012-10-14 DIAGNOSIS — Z0000 Encounter for general adult medical examination without abnormal findings: Secondary | ICD-10-CM

## 2012-10-14 DIAGNOSIS — Z23 Encounter for immunization: Secondary | ICD-10-CM

## 2012-10-14 LAB — URINALYSIS
Bilirubin Urine: NEGATIVE
Ketones, ur: NEGATIVE
Leukocytes, UA: NEGATIVE
Nitrite: NEGATIVE
Specific Gravity, Urine: >=1.03 (ref 1.000–1.030)
Total Protein, Urine: NEGATIVE
Urine Glucose: NEGATIVE
Urobilinogen, UA: 0.2 (ref 0.0–1.0)
pH: 6 (ref 5.0–8.0)

## 2012-10-14 LAB — HEPATIC FUNCTION PANEL
ALT: 25 U/L (ref 0–53)
Bilirubin, Direct: 0.1 mg/dL (ref 0.0–0.3)
Total Bilirubin: 0.9 mg/dL (ref 0.3–1.2)

## 2012-10-14 LAB — BASIC METABOLIC PANEL
BUN: 10 mg/dL (ref 6–23)
CO2: 27 mEq/L (ref 19–32)
Chloride: 108 mEq/L (ref 96–112)
Creatinine, Ser: 0.8 mg/dL (ref 0.4–1.5)
Glucose, Bld: 100 mg/dL — ABNORMAL HIGH (ref 70–99)
Potassium: 4 mEq/L (ref 3.5–5.1)

## 2012-10-14 LAB — CBC WITH DIFFERENTIAL/PLATELET
Eosinophils Relative: 7.1 % — ABNORMAL HIGH (ref 0.0–5.0)
HCT: 45 % (ref 39.0–52.0)
Lymphs Abs: 1.8 10*3/uL (ref 0.7–4.0)
MCHC: 34.8 g/dL (ref 30.0–36.0)
MCV: 100.2 fl — ABNORMAL HIGH (ref 78.0–100.0)
Monocytes Absolute: 0.4 10*3/uL (ref 0.1–1.0)
Neutro Abs: 2 10*3/uL (ref 1.4–7.7)
Neutrophils Relative %: 45.1 % (ref 43.0–77.0)
Platelets: 187 10*3/uL (ref 150.0–400.0)
RDW: 13.1 % (ref 11.5–14.6)
WBC: 4.5 10*3/uL (ref 4.5–10.5)

## 2012-10-14 LAB — LIPID PANEL: Cholesterol: 151 mg/dL (ref 0–200)

## 2012-10-14 LAB — TSH: TSH: 1.18 u[IU]/mL (ref 0.35–5.50)

## 2012-10-14 NOTE — Assessment & Plan Note (Signed)
Gluten free trial (no wheat products) for 4-6 weeks. OK to use gluten-free bread and gluten-free pasta.  Milk free trial (no milk, ice cream, cheese and yogurt) for 4-6 weeks. OK to use almond, coconut, rice or soy milk. "Almond breeze" brand tastes good.  

## 2012-10-14 NOTE — Assessment & Plan Note (Addendum)
We discussed age appropriate health related issues, including available/recomended screening tests and vaccinations. We discussed a need for adhering to healthy diet and exercise. Labs/EKG were reviewed/ordered. All questions were answered. Needs a colon

## 2012-10-14 NOTE — Progress Notes (Signed)
   Subjective:     HPI  The patient is here for a wellness exam. The patient has been doing well overall without major physical or psychological issues going on lately.  Review of Systems  Constitutional: Negative for fever, appetite change, fatigue and unexpected weight change.  HENT: Negative for hearing loss, nosebleeds, congestion, sore throat, sneezing, trouble swallowing and neck pain.   Eyes: Negative for itching and visual disturbance.  Respiratory: Negative for cough and chest tightness.   Cardiovascular: Negative for chest pain, palpitations and leg swelling.  Gastrointestinal: Negative for nausea, diarrhea, blood in stool and abdominal distention.  Genitourinary: Negative for frequency, hematuria, penile swelling and testicular pain.  Musculoskeletal: Negative for back pain, joint swelling and gait problem.       L upper hip hurts at night x 2 mo  Skin: Negative for rash.  Neurological: Negative for dizziness, tremors, speech difficulty and weakness.  Psychiatric/Behavioral: Negative for suicidal ideas, hallucinations, sleep disturbance, dysphoric mood and agitation. The patient is not nervous/anxious.        Objective:   Physical Exam  Constitutional: He is oriented to person, place, and time. He appears well-developed and well-nourished. No distress.  HENT:  Head: Normocephalic and atraumatic.  Right Ear: External ear normal.  Left Ear: External ear normal.  Nose: Nose normal.  Mouth/Throat: Oropharynx is clear and moist. No oropharyngeal exudate.  Eyes: Conjunctivae and EOM are normal. Pupils are equal, round, and reactive to light. Right eye exhibits no discharge. Left eye exhibits no discharge. No scleral icterus.  Neck: Normal range of motion. Neck supple. No JVD present. No tracheal deviation present. No thyromegaly present.  Cardiovascular: Normal rate, regular rhythm, normal heart sounds and intact distal pulses.  Exam reveals no gallop and no friction rub.    No murmur heard. Pulmonary/Chest: Effort normal and breath sounds normal. No stridor. No respiratory distress. He has no wheezes. He has no rales. He exhibits no tenderness.  Abdominal: Soft. Bowel sounds are normal. He exhibits no distension and no mass. There is no tenderness. There is no rebound and no guarding.  Genitourinary:  Rect def to urol  Musculoskeletal: Normal range of motion. He exhibits no edema and no tenderness.  Lymphadenopathy:    He has no cervical adenopathy.  Neurological: He is alert and oriented to person, place, and time. He has normal reflexes. No cranial nerve deficit. He exhibits normal muscle tone. Coordination normal.  Skin: Skin is warm and dry. No rash noted. He is not diaphoretic. No erythema. No pallor.  Psychiatric: He has a normal mood and affect. His behavior is normal. Judgment and thought content normal.   Rectal is def to Dr Isabel Caprice   Lab Results  Component Value Date   WBC 4.6 10/08/2011   HGB 15.1 10/08/2011   HCT 44.9 10/08/2011   PLT 184.0 10/08/2011   CHOL 156 10/08/2011   TRIG 51.0 10/08/2011   HDL 40.80 10/08/2011   ALT 22 10/08/2011   AST 22 10/08/2011   NA 138 10/08/2011   K 4.4 10/08/2011   CL 105 10/08/2011   CREATININE 0.9 10/08/2011   BUN 14 10/08/2011   CO2 26 10/08/2011   TSH 1.54 10/08/2011   PSA 0.06* 08/19/2009   INR 1.0 ratio 03/27/2008        Assessment & Plan:

## 2012-10-14 NOTE — Patient Instructions (Signed)
Gluten free trial (no wheat products) for 4-6 weeks. OK to use gluten-free bread and gluten-free pasta.  Milk free trial (no milk, ice cream, cheese and yogurt) for 4-6 weeks. OK to use almond, coconut, rice or soy milk. "Almond breeze" brand tastes good.  

## 2012-11-18 ENCOUNTER — Encounter: Payer: Self-pay | Admitting: Gastroenterology

## 2012-11-18 ENCOUNTER — Ambulatory Visit (INDEPENDENT_AMBULATORY_CARE_PROVIDER_SITE_OTHER): Payer: Medicare Other | Admitting: Gastroenterology

## 2012-11-18 VITALS — BP 130/72 | HR 64 | Ht 66.5 in | Wt 185.4 lb

## 2012-11-18 DIAGNOSIS — Z1211 Encounter for screening for malignant neoplasm of colon: Secondary | ICD-10-CM

## 2012-11-18 DIAGNOSIS — R933 Abnormal findings on diagnostic imaging of other parts of digestive tract: Secondary | ICD-10-CM

## 2012-11-18 NOTE — Patient Instructions (Signed)
You are due for routine risk colon cancer screening in 2018, July. Please call sooner with any questions or concerns.

## 2012-11-18 NOTE — Progress Notes (Signed)
HPI: This is a  very pleasant 72 year old man whom I last saw about 6 years ago at the time of a colonoscopy. See those results summarized below.  He thought his last screening colonoscopy was 8-10 years ago.  NO bowel changes, no stool changes.  No overt GI bleeding.   He has lost 20 pounds intentionally, has been working out more, eating better since retiring.  Review of pertinent gastrointestinal problems: 1. Routine risk for colon cancer;  Colonoscopy [facility] 2003, three 2mm polyps were removed with hot biopsy, not sent to pathology.  He was recommended by [facility] to have repeat colonoscopy at 4 year interval and get yearly hemoccult testing.  Repeat colonoscopy Christella Hartigan 06/2006, left sided diverticulum, no polyps. Recommended repeat colonoscopy at 10 year interval.  Cbc 10/14 showed normal Hb, slightly macrocytic   Review of systems: Pertinent positive and negative review of systems were noted in the above HPI section. Complete review of systems was performed and was otherwise normal.    Past Medical History  Diagnosis Date  . Prostate cancer     hx of s/p XRT, seeds Dr. Isabel Caprice  . AK (actinic keratosis)   . Osteopenia   . Allergic rhinitis   . History of colonic polyps     Dr. Christella Hartigan- Due 2018  . History of skin cancer     Past Surgical History  Procedure Laterality Date  . Xrt  2008  . Insertion prostate radiation seed    . Knee arthroscopy Right   . Knee surgery Left   . Tonsillectomy      Current Outpatient Prescriptions  Medication Sig Dispense Refill  . Cholecalciferol 1000 UNITS tablet Take 1,000 Units by mouth daily.        . fluticasone (FLONASE) 50 MCG/ACT nasal spray Place 1 spray into the nose daily.  16 g  3  . loratadine (CLARITIN) 10 MG tablet Take 1 tablet (10 mg total) by mouth daily.  90 tablet  3  . Omega-3 Fatty Acids (FISH OIL) 1000 MG CAPS Take 1 capsule by mouth daily.         No current facility-administered medications for this visit.    Allergies  as of 11/18/2012 - Review Complete 11/18/2012  Allergen Reaction Noted  . Aspirin      Family History  Problem Relation Age of Onset  . Liver disease Father   . Cancer Father     pancreas ?  Marland Kitchen Cancer - Other      intestinal cancer    History   Social History  . Marital Status: Married    Spouse Name: N/A    Number of Children: 2  . Years of Education: N/A   Occupational History  . retired from [employer]    Social History Main Topics  . Smoking status: Current Some Day Smoker    Types: Cigars  . Smokeless tobacco: Never Used     Comment: cigar once a month  . Alcohol Use: 0.6 oz/week    1 Cans of beer per week  . Drug Use: No  . Sexual Activity: Yes   Other Topics Concern  . Not on file   Social History Narrative  . No narrative on file       Physical Exam: Ht 5' 6.5" (1.689 m)  Wt 185 lb 6 oz (84.086 kg)  BMI 29.48 kg/m2 Constitutional: generally well-appearing Psychiatric: alert and oriented x3 Eyes: extraocular movements intact Mouth: oral pharynx moist, no lesions Neck: supple no lymphadenopathy Cardiovascular:  heart regular rate and rhythm Lungs: clear to auscultation bilaterally Abdomen: soft, nontender, nondistended, no obvious ascites, no peritoneal signs, normal bowel sounds Extremities: no lower extremity edema bilaterally Skin: no lesions on visible extremities    Assessment and plan: 72 y.o. male with  routine risk for colon cancer  He had a colonoscopy in 2003 with Dr. Doreatha Martin, 3 very small polyps were removed. Unfortunately none of them were sent for pathology. He recommended recall colonoscopy at 4 year interval with yearly Hemoccults. This is an unusual surveillance regimen.  Repeat colonoscopy 4 years later by me found no polyps or cancers. Recommended repeat screening examination at 10 year interval. That'll be in 2018. He and I discussed colon cancer screening recommendations and we agreed to wait the usual interval until 2018. He does  understand if he has any concerning GI symptoms prior to then he will call.

## 2012-11-21 ENCOUNTER — Other Ambulatory Visit: Payer: Self-pay | Admitting: Internal Medicine

## 2013-02-03 ENCOUNTER — Ambulatory Visit (INDEPENDENT_AMBULATORY_CARE_PROVIDER_SITE_OTHER): Payer: Medicare Other | Admitting: Internal Medicine

## 2013-02-03 ENCOUNTER — Encounter: Payer: Self-pay | Admitting: Internal Medicine

## 2013-02-03 ENCOUNTER — Ambulatory Visit (INDEPENDENT_AMBULATORY_CARE_PROVIDER_SITE_OTHER)
Admission: RE | Admit: 2013-02-03 | Discharge: 2013-02-03 | Disposition: A | Discharge status: Home / Self Care | Payer: Medicare Other | Source: Ambulatory Visit | Attending: Internal Medicine | Admitting: Internal Medicine

## 2013-02-03 VITALS — BP 130/90 | HR 72 | Temp 98.2°F | Resp 16 | Wt 188.0 lb

## 2013-02-03 DIAGNOSIS — M25562 Pain in left knee: Secondary | ICD-10-CM | POA: Insufficient documentation

## 2013-02-03 DIAGNOSIS — M25569 Pain in unspecified knee: Secondary | ICD-10-CM

## 2013-02-03 DIAGNOSIS — M1710 Unilateral primary osteoarthritis, unspecified knee: Secondary | ICD-10-CM

## 2013-02-03 DIAGNOSIS — IMO0002 Reserved for concepts with insufficient information to code with codable children: Secondary | ICD-10-CM

## 2013-02-03 DIAGNOSIS — M1712 Unilateral primary osteoarthritis, left knee: Secondary | ICD-10-CM | POA: Insufficient documentation

## 2013-02-03 MED ORDER — MELOXICAM 15 MG PO TABS: 15.0000 mg | ORAL_TABLET | Freq: Every day | ORAL | Status: DC | PRN

## 2013-02-03 MED ORDER — TRAMADOL HCL 50 MG PO TABS: 50.0000 mg | ORAL_TABLET | Freq: Two times a day (BID) | ORAL | Status: DC | PRN

## 2013-02-03 NOTE — Assessment & Plan Note (Signed)
X ray

## 2013-02-03 NOTE — Progress Notes (Signed)
   Subjective:     HPI  The patient is here for a wellness exam. The patient has been doing well overall without major physical or psychological issues going on lately.  Review of Systems  Constitutional: Negative for fever, appetite change, fatigue and unexpected weight change.  HENT: Negative for congestion, hearing loss, nosebleeds, sneezing, sore throat and trouble swallowing.   Eyes: Negative for itching and visual disturbance.  Respiratory: Negative for cough and chest tightness.   Cardiovascular: Negative for chest pain, palpitations and leg swelling.  Gastrointestinal: Negative for nausea, diarrhea, blood in stool and abdominal distention.  Genitourinary: Negative for frequency, hematuria, penile swelling and testicular pain.  Musculoskeletal: Negative for back pain, gait problem, joint swelling and neck pain.       L upper hip hurts at night x 2 mo  Skin: Negative for rash.  Neurological: Negative for dizziness, tremors, speech difficulty and weakness.  Psychiatric/Behavioral: Negative for suicidal ideas, hallucinations, sleep disturbance, dysphoric mood and agitation. The patient is not nervous/anxious.        Objective:   Physical Exam  Constitutional: He is oriented to person, place, and time. He appears well-developed and well-nourished. No distress.  HENT:  Head: Normocephalic and atraumatic.  Right Ear: External ear normal.  Left Ear: External ear normal.  Nose: Nose normal.  Mouth/Throat: Oropharynx is clear and moist. No oropharyngeal exudate.  Eyes: Conjunctivae and EOM are normal. Pupils are equal, round, and reactive to light. Right eye exhibits no discharge. Left eye exhibits no discharge. No scleral icterus.  Neck: Normal range of motion. Neck supple. No JVD present. No tracheal deviation present. No thyromegaly present.  Cardiovascular: Normal rate, regular rhythm, normal heart sounds and intact distal pulses.  Exam reveals no gallop and no friction rub.    No murmur heard. Pulmonary/Chest: Effort normal and breath sounds normal. No stridor. No respiratory distress. He has no wheezes. He has no rales. He exhibits no tenderness.  Abdominal: Soft. Bowel sounds are normal. He exhibits no distension and no mass. There is no tenderness. There is no rebound and no guarding.  Genitourinary:  Rect def to urol  Musculoskeletal: Normal range of motion. He exhibits no edema and no tenderness.  Lymphadenopathy:    He has no cervical adenopathy.  Neurological: He is alert and oriented to person, place, and time. He has normal reflexes. No cranial nerve deficit. He exhibits normal muscle tone. Coordination normal.  Skin: Skin is warm and dry. No rash noted. He is not diaphoretic. No erythema. No pallor.  Psychiatric: He has a normal mood and affect. His behavior is normal. Judgment and thought content normal.  L knee is tender w/ROM  Rectal is def to Dr Risa Grill   Lab Results  Component Value Date   WBC 4.5 10/14/2012   HGB 15.6 10/14/2012   HCT 45.0 10/14/2012   PLT 187.0 10/14/2012   CHOL 151 10/14/2012   TRIG 60.0 10/14/2012   HDL 40.90 10/14/2012   ALT 25 10/14/2012   AST 26 10/14/2012   NA 139 10/14/2012   K 4.0 10/14/2012   CL 108 10/14/2012   CREATININE 0.8 10/14/2012   BUN 10 10/14/2012   CO2 27 10/14/2012   TSH 1.18 10/14/2012   PSA 0.06* 08/19/2009   INR 1.0 ratio 03/27/2008        Assessment & Plan:

## 2013-02-03 NOTE — Assessment & Plan Note (Signed)
1/15 OA Meloxicam prn Tramadol

## 2013-02-03 NOTE — Progress Notes (Signed)
Pre visit review using our clinic review tool, if applicable. No additional management support is needed unless otherwise documented below in the visit note. 

## 2013-03-10 ENCOUNTER — Ambulatory Visit: Payer: Medicare Other | Admitting: Internal Medicine

## 2013-03-13 ENCOUNTER — Ambulatory Visit: Payer: Medicare Other | Admitting: Internal Medicine

## 2013-03-15 ENCOUNTER — Other Ambulatory Visit (INDEPENDENT_AMBULATORY_CARE_PROVIDER_SITE_OTHER): Payer: Medicare Other

## 2013-03-15 ENCOUNTER — Encounter: Payer: Self-pay | Admitting: Internal Medicine

## 2013-03-15 ENCOUNTER — Ambulatory Visit (INDEPENDENT_AMBULATORY_CARE_PROVIDER_SITE_OTHER): Payer: Medicare Other | Admitting: Family Medicine

## 2013-03-15 ENCOUNTER — Encounter: Payer: Self-pay | Admitting: Family Medicine

## 2013-03-15 ENCOUNTER — Ambulatory Visit (INDEPENDENT_AMBULATORY_CARE_PROVIDER_SITE_OTHER): Payer: Medicare Other | Admitting: Internal Medicine

## 2013-03-15 VITALS — BP 122/82 | HR 80 | Temp 97.5°F | Resp 16 | Wt 185.0 lb

## 2013-03-15 DIAGNOSIS — M1710 Unilateral primary osteoarthritis, unspecified knee: Secondary | ICD-10-CM

## 2013-03-15 DIAGNOSIS — M25569 Pain in unspecified knee: Secondary | ICD-10-CM

## 2013-03-15 DIAGNOSIS — M25562 Pain in left knee: Secondary | ICD-10-CM

## 2013-03-15 DIAGNOSIS — J019 Acute sinusitis, unspecified: Secondary | ICD-10-CM

## 2013-03-15 DIAGNOSIS — M1712 Unilateral primary osteoarthritis, left knee: Secondary | ICD-10-CM

## 2013-03-15 DIAGNOSIS — IMO0002 Reserved for concepts with insufficient information to code with codable children: Secondary | ICD-10-CM

## 2013-03-15 MED ORDER — CEFUROXIME AXETIL 500 MG PO TABS: ORAL_TABLET | ORAL | Status: DC

## 2013-03-15 NOTE — Progress Notes (Signed)
Pre visit review using our clinic review tool, if applicable. No additional management support is needed unless otherwise documented below in the visit note. 

## 2013-03-15 NOTE — Patient Instructions (Signed)
Nice to meet you Try exercises most days of the week.  Ice 20 minutes 2 times daily.  xrays downstairs today.  Take tylenol 650 mg three times a day is the best evidence based medicine we have for arthritis.  Glucosamine sulfate 750mg  twice a day is a supplement that has been shown to help moderate to severe arthritis. Vitamin D 2000 IU daily Fish oil 2 grams daily.  Tumeric 500mg  twice daily.  Try topical 2 times a day Cortisone injections are an option if these interventions do not seem to make a difference or need more relief.  If cortisone injections do not help, there are different types of shots that may help but they take longer to take effect.  We can discuss this at follow up.  It's important that you continue to stay active. Controlling your weight is important.  Consider physical therapy to strengthen muscles around the joint that hurts to take pressure off of the joint itself. Shoe inserts with good arch support may be helpful.  [employer] orthotics at [employer] could help.  Water aerobics and cycling with low resistance are the best two types of exercise for arthritis. Come back and see me in 3-4 weeks.

## 2013-03-15 NOTE — Assessment & Plan Note (Signed)
Discussed different treatment options with patient today. Injection given into the left knee. Discuss home exercises Discussed over-the-counter medication I icing Come back in 3-4 weeks for further evaluation.

## 2013-03-15 NOTE — Assessment & Plan Note (Signed)
Not better Sport Med ref

## 2013-03-15 NOTE — Progress Notes (Signed)
[name] [address] [place], [place] [address] Phone: [phone] Subjective:    I'm seeing this patient by the request  of:  Walker Kehr, MD   CC: left knee pain  [id number] Jeremy [name] is a 73 y.o. male coming in with complaint of left knee pain. Patient has had pain for quite some time and seems to be more of an insidious onset. Notice it worse with cold weather as well as walking on the snow. Patient states that he walks a lot he has pain on the medial aspect of his left knee. Patient has tried some mild over-the-counter medications without any significant improvement. Denies any radiation of pain or any numbness or weakness of the lower extremity. Denies that the pain keeps him up at night. Describes it more as a dull throbbing sensation that is 6/10 in severity. Patient does have a past medical history significant for a meniscectomy multiple decades ago.     Past medical history, social, surgical and family history all reviewed in electronic medical record.   Review of Systems: No headache, visual changes, nausea, vomiting, diarrhea, constipation, dizziness, abdominal pain, skin rash, fevers, chills, night sweats, weight loss, swollen lymph nodes, body aches, joint swelling, muscle aches, chest pain, shortness of breath, mood changes.   Objective Blood pressure 122/82, pulse 80, temperature 97.5 F (36.4 C), temperature source Oral, resp. rate 16, weight 185 lb (83.915 kg), SpO2 98.00%.  General: No apparent distress alert and oriented x3 mood and affect normal, dressed appropriately.  HEENT: Pupils equal, extraocular movements intact  Respiratory: Patient's speak in full sentences and does not appear short of breath  Cardiovascular: No lower extremity edema, non tender, no erythema  Skin: Warm dry intact with no signs of infection or rash on extremities or on axial skeleton.  Abdomen: Soft nontender  Neuro: Cranial nerves II through XII  are intact, neurovascularly intact in all extremities with 2+ DTRs and 2+ pulses.  Lymph: No lymphadenopathy of posterior or anterior cervical chain or axillae bilaterally.  Gait normal with good balance and coordination.  MSK:  Non tender with full range of motion and good stability and symmetric strength and tone of shoulders, elbows, wrist, hip, and ankles bilaterally.  Knee: Left On inspection osteoarthritic changes Palpation shows the patient does have tenderness to palpation over the medial joint line ROM full in flexion and extension and lower leg rotation. Ligaments with solid consistent endpoints including ACL, PCL, LCL, MCL. Negative Mcmurray's, Apley's, and Thessalonian tests. Mild painful patellar compression. Patellar glide with moderate crepitus. Patellar and quadriceps tendons unremarkable. Hamstring and quadriceps strength is normal.  Contralateral knee has a medial joint line tenderness as well as osteoarthritic changes.  MSK US performed of: Left knee This study was ordered, performed, and interpreted by Charlann Boxer D.O.  Knee: All structures visualized. Anteromedial appears to be surgically removed and patient does have severe narrowing of the medial joint line.  anterolateral, and posterolateral menisci unremarkable without tearing, fraying, effusion, or displacement. Patellar Tendon unremarkable on long and transverse views without effusion. No abnormality of prepatellar bursa. LCL and MCL unremarkable on long and transverse views. No abnormality of origin of medial or lateral head of the gastrocnemius.  IMPRESSION:  Osteoarthritis of the medial compartment  After informed written and verbal consent, patient was seated on exam table. Left knee was prepped with alcohol swab and utilizing anterolateral approach, patient's left knee space was injected with 4:1  marcaine 0.5%: Kenalog 40mg /dL.  Patient tolerated the procedure well without immediate complications.     Impression and Recommendations:     This case required medical decision making of moderate complexity.

## 2013-03-15 NOTE — Assessment & Plan Note (Signed)
ceftin if worse 

## 2013-03-15 NOTE — Progress Notes (Signed)
   Subjective:     HPI  C/o nasal congestion - bad x 2-3 wks, Afrin is helping F/u L knee pain - not better; hurts to walk, getting into his car etc  Review of Systems  Constitutional: Negative for fever, appetite change, fatigue and unexpected weight change.  HENT: Negative for congestion, hearing loss, nosebleeds, sneezing, sore throat and trouble swallowing.   Eyes: Negative for itching and visual disturbance.  Respiratory: Negative for cough and chest tightness.   Cardiovascular: Negative for chest pain, palpitations and leg swelling.  Gastrointestinal: Negative for nausea, diarrhea, blood in stool and abdominal distention.  Genitourinary: Negative for frequency, hematuria, penile swelling and testicular pain.  Musculoskeletal: Negative for back pain, gait problem, joint swelling and neck pain.       L upper hip hurts at night x 2 mo  Skin: Negative for rash.  Neurological: Negative for dizziness, tremors, speech difficulty and weakness.  Psychiatric/Behavioral: Negative for suicidal ideas, hallucinations, sleep disturbance, dysphoric mood and agitation. The patient is not nervous/anxious.        Objective:   Physical Exam  Constitutional: He is oriented to person, place, and time. He appears well-developed and well-nourished. No distress.  HENT:  Head: Normocephalic and atraumatic.  Right Ear: External ear normal.  Left Ear: External ear normal.  Mouth/Throat: Oropharynx is clear and moist. No oropharyngeal exudate.  Swollen nasal mucosa  Eyes: Conjunctivae and EOM are normal. Pupils are equal, round, and reactive to light. Right eye exhibits no discharge. Left eye exhibits no discharge. No scleral icterus.  Neck: Normal range of motion. Neck supple. No JVD present. No tracheal deviation present. No thyromegaly present.  Cardiovascular: Normal rate, regular rhythm, normal heart sounds and intact distal pulses.  Exam reveals no gallop and no friction rub.   No murmur  heard. Pulmonary/Chest: Effort normal and breath sounds normal. No stridor. No respiratory distress. He has no wheezes. He has no rales. He exhibits no tenderness.  Abdominal: Soft. Bowel sounds are normal. He exhibits no distension and no mass. There is no tenderness. There is no rebound and no guarding.  Genitourinary:  Rect def to urol  Musculoskeletal: Normal range of motion. He exhibits no edema and no tenderness.  Lymphadenopathy:    He has no cervical adenopathy.  Neurological: He is alert and oriented to person, place, and time. He has normal reflexes. No cranial nerve deficit. He exhibits normal muscle tone. Coordination normal.  Skin: Skin is warm and dry. No rash noted. He is not diaphoretic. No erythema. No pallor.  Psychiatric: He has a normal mood and affect. His behavior is normal. Judgment and thought content normal.  L knee is tender w/ROM    Lab Results  Component Value Date   WBC 4.5 10/14/2012   HGB 15.6 10/14/2012   HCT 45.0 10/14/2012   PLT 187.0 10/14/2012   CHOL 151 10/14/2012   TRIG 60.0 10/14/2012   HDL 40.90 10/14/2012   ALT 25 10/14/2012   AST 26 10/14/2012   NA 139 10/14/2012   K 4.0 10/14/2012   CL 108 10/14/2012   CREATININE 0.8 10/14/2012   BUN 10 10/14/2012   CO2 27 10/14/2012   TSH 1.18 10/14/2012   PSA 0.06* 08/19/2009   INR 1.0 ratio 03/27/2008        Assessment & Plan:

## 2013-03-24 ENCOUNTER — Ambulatory Visit: Payer: Medicare Other | Admitting: Family Medicine

## 2013-04-21 ENCOUNTER — Ambulatory Visit (INDEPENDENT_AMBULATORY_CARE_PROVIDER_SITE_OTHER): Payer: Medicare Other | Admitting: Family Medicine

## 2013-04-21 ENCOUNTER — Encounter: Payer: Self-pay | Admitting: Family Medicine

## 2013-04-21 VITALS — BP 108/72 | HR 68

## 2013-04-21 DIAGNOSIS — M1710 Unilateral primary osteoarthritis, unspecified knee: Secondary | ICD-10-CM

## 2013-04-21 NOTE — Progress Notes (Signed)
  Jeremy [name], Jeremy [name] Subjective:    CC: left knee pain  FGH:[id number] [name] is a 74 y.o. male coming in with complaint of left knee pain follow up. 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 Patient did have a steroid injection at last visit and was to do home exercises, icing protocol, and we discussed other over-the-counter medications that could be beneficial. Patient states he is doing about 50% better. Patient states he is not pain free but is able to do all activities of daily living without any discomfort. Patient has been playing golf on a regular basis without any significant pain. Patient is not taking only over-the-counter medications does not using any pain medications either. Patient has a compression sleeve he has been using has been a little bit helpful. Patient is fairly happy with the results. No new symptoms     Past medical history, social, surgical and family history all reviewed in electronic medical record.   Review of Systems: No headache, visual changes, nausea, vomiting, diarrhea, constipation, dizziness, abdominal pain, skin rash, fevers, chills, night sweats, weight loss, swollen lymph nodes, body aches, joint swelling, muscle aches, chest pain, shortness of breath, mood changes.   Objective Blood pressure 108/72, pulse 68, SpO2 98.00%.  General: No apparent distress alert and oriented x3 mood and affect normal, dressed appropriately.  HEENT: Pupils equal, extraocular movements intact  Respiratory: Patient's speak in full sentences and does not appear short of breath  Cardiovascular: No lower extremity edema, non tender, no erythema  Skin: Warm dry intact with no signs of infection or rash on extremities or on axial skeleton.  Abdomen: Soft nontender  Neuro: Cranial nerves II through XII are intact, neurovascularly intact in all  extremities with 2+ DTRs and 2+ pulses.  Lymph: No lymphadenopathy of posterior or anterior cervical chain or axillae bilaterally.  Gait normal with good balance and coordination.  MSK:  Non tender with full range of motion and good stability and symmetric strength and tone of shoulders, elbows, wrist, hip, and ankles bilaterally.  Knee: Left On inspection osteoarthritic changes Patient is only minimal tenderness to palpation of the medial joint line this is an improvement from previous exam. ROM full in flexion and extension and lower leg rotation. Ligaments with solid consistent endpoints including ACL, PCL, LCL, MCL. Negative Mcmurray's, Apley's, and Thessalonian tests. Mild painful patellar compression. Patellar glide with moderate crepitus. Patellar and quadriceps tendons unremarkable. Hamstring and quadriceps strength is normal.  Contralateral knee has a medial joint line tenderness as well as osteoarthritic changes.    Impression and Recommendations:     This case required medical decision making of moderate complexity.

## 2013-04-21 NOTE — Assessment & Plan Note (Signed)
Patient is doing very well. Patient continued to take the medications on an as needed basis. We discussed icing protocol and we discussed over-the-counter braces that could be beneficial. As long as patient continues to respond to conservative therapy we will continue area and we can repeat injection again in another 2 months if necessary. Patient will come back in 2 months for further evaluation. Patient agrees with plan.  Spent greater than 25 minutes with patient face-to-face and had greater than 50% of counseling including as described above in assessment and plan.

## 2013-04-21 NOTE — Patient Instructions (Signed)
Good to see you Try the exercises 3 times a week Treadmill only 2 times a week.  Elliptical or bike would be better Ice at the end of activity  Body [email or web address] knee sleeve size medium.  Take tylenol 650 mg three times a day is the best evidence based medicine we have for arthritis.  Glucosamine sulfate 750mg  twice a day is a supplement that has been shown to help moderate to severe arthritis. Vitamin D 2000 IU daily Fish oil 2 grams daily.  Tumeric 500mg  twice daily.  See me again in 2 months and we can do another injection.

## 2013-06-23 ENCOUNTER — Ambulatory Visit (INDEPENDENT_AMBULATORY_CARE_PROVIDER_SITE_OTHER): Payer: Medicare Other | Admitting: Family Medicine

## 2013-06-23 ENCOUNTER — Encounter: Payer: Self-pay | Admitting: Family Medicine

## 2013-06-23 VITALS — BP 124/70 | HR 65 | Ht 68.5 in | Wt 188.0 lb

## 2013-06-23 DIAGNOSIS — M1710 Unilateral primary osteoarthritis, unspecified knee: Secondary | ICD-10-CM

## 2013-06-23 MED ORDER — DICLOFENAC SODIUM 2 % TD SOLN: 2.0000 "application " | Freq: Two times a day (BID) | TRANSDERMAL | Status: DC

## 2013-06-23 NOTE — Progress Notes (Signed)
  Jeremy [name] Sports [facility] Jeremy [name], [place] [address] Phone: [phone] Subjective:    CC: left knee pain follow up  EUM:[id number] Jeremy [name] is a 73 y.o. male coming in with complaint of left knee pain follow up. 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 Patient has not been seen for over 2 months. Patient states overall he has been doing relatively well. Patient states that he has not had any new symptoms and has been able to play golf most days a week as well as go to the gym. Patient denies any new symptoms. Patient denies that the pain is stopping him from any activities are keeping him up at night. Not taking anything other than the natural supplements we discussed previously.     Past medical history, social, surgical and family history all reviewed in electronic medical record.   Review of Systems: No headache, visual changes, nausea, vomiting, diarrhea, constipation, dizziness, abdominal pain, skin rash, fevers, chills, night sweats, weight loss, swollen lymph nodes, body aches, joint swelling, muscle aches, chest pain, shortness of breath, mood changes.   Objective Blood pressure 124/70, pulse 65, height 5' 8.5" (1.74 m), weight 188 lb (85.276 kg), SpO2 97.00%.  General: No apparent distress alert and oriented x3 mood and affect normal, dressed appropriately.  HEENT: Pupils equal, extraocular movements intact  Respiratory: Patient's speak in full sentences and does not appear short of breath  Cardiovascular: No lower extremity edema, non tender, no erythema  Skin: Warm dry intact with no signs of infection or rash on extremities or on axial skeleton.  Abdomen: Soft nontender  Neuro: Cranial nerves II through XII are intact, neurovascularly intact in all extremities with 2+ DTRs and 2+ pulses.  Lymph: No lymphadenopathy of posterior or anterior cervical chain or axillae bilaterally.  Gait normal with good  balance and coordination.  MSK:  Non tender with full range of motion and good stability and symmetric strength and tone of shoulders, elbows, wrist, hip, and ankles bilaterally.  Knee: Left On inspection osteoarthritic changes Patient is only minimal tenderness to palpation of the medial joint line. ROM full in flexion and extension and lower leg rotation. Ligaments with solid consistent endpoints including ACL, PCL, LCL, MCL. Negative Mcmurray's, Apley's, and Thessalonian tests. Mild painful patellar compression. Patellar glide with moderate crepitus. Patellar and quadriceps tendons unremarkable. Hamstring and quadriceps strength is normal.  Contralateral knee has a medial joint line tenderness as well as osteoarthritic changes.    Impression and Recommendations:     This case required medical decision making of moderate complexity.

## 2013-06-23 NOTE — Assessment & Plan Note (Signed)
We discussed for quite some time. Patient is doing really well at this time. We discussed the possibility of bracing and patient tried a brace on today but did not want to take it. In addition this patient will get a compression sleeve over-the-counter. We discussed an icing protocol patient was given other exercises to be beneficial. Patient will continue being natural supplements. Patient will continue to follow up and if he has any significant pain we can notice do another corticosteroid injection. If for some reason the steroids would not work he would've failed all conservative therapy and he would be a candidate for viscous supplementation. All questions the patient was answered today.  Spent greater than 25 minutes with patient face-to-face and had greater than 50% of counseling including as described above in assessment and plan.

## 2013-06-23 NOTE — Patient Instructions (Signed)
Good to see you Ice when you need it, especially after activity.  Consider the brace [place] twice daily if you need it.  They will be calling you.  Continue good shoes.  Continue tylenol.  See me when you need me and we can always try to do another shot.

## 2013-09-05 ENCOUNTER — Ambulatory Visit: Payer: Medicare Other | Admitting: Internal Medicine

## 2013-09-07 ENCOUNTER — Ambulatory Visit (INDEPENDENT_AMBULATORY_CARE_PROVIDER_SITE_OTHER): Payer: Medicare Other | Admitting: Internal Medicine

## 2013-09-07 ENCOUNTER — Encounter: Payer: Self-pay | Admitting: Internal Medicine

## 2013-09-07 VITALS — BP 150/98 | HR 80 | Temp 98.1°F | Resp 16 | Wt 188.0 lb

## 2013-09-07 DIAGNOSIS — M722 Plantar fascial fibromatosis: Secondary | ICD-10-CM | POA: Insufficient documentation

## 2013-09-07 NOTE — Patient Instructions (Signed)
[employer] supports [employer]

## 2013-09-07 NOTE — Assessment & Plan Note (Signed)
R heel 8/15 [employer]

## 2013-09-07 NOTE — Progress Notes (Signed)
Patient ID: Jeremy [name], male   DOB: Oct 02, 1940, 73 y.o.   MRN: [id number]   Subjective:     HPI  C/o R heel pain x 2-3 wks, worse in am; rest is helping   Review of Systems  Constitutional: Negative for fever, appetite change, fatigue and unexpected weight change.  HENT: Negative for congestion, hearing loss, nosebleeds, sneezing, sore throat and trouble swallowing.   Eyes: Negative for itching and visual disturbance.  Respiratory: Negative for cough and chest tightness.   Cardiovascular: Negative for chest pain, palpitations and leg swelling.  Gastrointestinal: Negative for nausea, diarrhea, blood in stool and abdominal distention.  Genitourinary: Negative for frequency, hematuria, penile swelling and testicular pain.  Musculoskeletal: Positive for arthralgias. Negative for back pain, gait problem, joint swelling and neck pain.  Skin: Negative for rash.  Neurological: Negative for dizziness, tremors, speech difficulty and weakness.  Psychiatric/Behavioral: Negative for suicidal ideas, hallucinations, sleep disturbance, dysphoric mood and agitation. The patient is not nervous/anxious.        Objective:   Physical Exam  Constitutional: He is oriented to person, place, and time. He appears well-developed and well-nourished. No distress.  HENT:  Head: Normocephalic and atraumatic.  Right Ear: External ear normal.  Left Ear: External ear normal.  Mouth/Throat: Oropharynx is clear and moist. No oropharyngeal exudate.  Swollen nasal mucosa  Eyes: Conjunctivae and EOM are normal. Pupils are equal, round, and reactive to light. Right eye exhibits no discharge. Left eye exhibits no discharge. No scleral icterus.  Neck: Normal range of motion. Neck supple. No JVD present. No tracheal deviation present. No thyromegaly present.  Cardiovascular: Normal rate, regular rhythm, normal heart sounds and intact distal pulses.  Exam reveals no gallop and no friction rub.   No murmur  heard. Pulmonary/Chest: Effort normal and breath sounds normal. No stridor. No respiratory distress. He has no wheezes. He has no rales. He exhibits no tenderness.  Abdominal: Soft. Bowel sounds are normal. He exhibits no distension and no mass. There is no tenderness. There is no rebound and no guarding.  Genitourinary:  Rect def to urol  Musculoskeletal: Normal range of motion. He exhibits tenderness. He exhibits no edema.  R heel bottom is tender  Lymphadenopathy:    He has no cervical adenopathy.  Neurological: He is alert and oriented to person, place, and time. He has normal reflexes. No cranial nerve deficit. He exhibits normal muscle tone. Coordination normal.  Skin: Skin is warm and dry. No rash noted. He is not diaphoretic. No erythema. No pallor.  Psychiatric: He has a normal mood and affect. His behavior is normal. Judgment and thought content normal.      Lab Results  Component Value Date   WBC 4.5 10/14/2012   HGB 15.6 10/14/2012   HCT 45.0 10/14/2012   PLT 187.0 10/14/2012   CHOL 151 10/14/2012   TRIG 60.0 10/14/2012   HDL 40.90 10/14/2012   ALT 25 10/14/2012   AST 26 10/14/2012   NA 139 10/14/2012   K 4.0 10/14/2012   CL 108 10/14/2012   CREATININE 0.8 10/14/2012   BUN 10 10/14/2012   CO2 27 10/14/2012   TSH 1.18 10/14/2012   PSA 0.06* 08/19/2009   INR 1.0 ratio 03/27/2008        Assessment & Plan:

## 2013-09-07 NOTE — Progress Notes (Signed)
Pre visit review using our clinic review tool, if applicable. No additional management support is needed unless otherwise documented below in the visit note. 

## 2013-10-20 ENCOUNTER — Ambulatory Visit (INDEPENDENT_AMBULATORY_CARE_PROVIDER_SITE_OTHER): Payer: Medicare Other | Admitting: Internal Medicine

## 2013-10-20 ENCOUNTER — Encounter: Payer: Self-pay | Admitting: Internal Medicine

## 2013-10-20 ENCOUNTER — Other Ambulatory Visit (INDEPENDENT_AMBULATORY_CARE_PROVIDER_SITE_OTHER): Payer: Medicare Other

## 2013-10-20 ENCOUNTER — Other Ambulatory Visit: Payer: Medicare Other

## 2013-10-20 VITALS — BP 140/72 | HR 80 | Temp 97.6°F | Resp 16 | Ht 68.5 in | Wt 186.0 lb

## 2013-10-20 DIAGNOSIS — Z0000 Encounter for general adult medical examination without abnormal findings: Secondary | ICD-10-CM

## 2013-10-20 DIAGNOSIS — Z23 Encounter for immunization: Secondary | ICD-10-CM

## 2013-10-20 DIAGNOSIS — M722 Plantar fascial fibromatosis: Secondary | ICD-10-CM

## 2013-10-20 DIAGNOSIS — Z8546 Personal history of malignant neoplasm of prostate: Secondary | ICD-10-CM

## 2013-10-20 DIAGNOSIS — E785 Hyperlipidemia, unspecified: Secondary | ICD-10-CM

## 2013-10-20 LAB — URINALYSIS, ROUTINE W REFLEX MICROSCOPIC
Bilirubin Urine: NEGATIVE
Ketones, ur: NEGATIVE
LEUKOCYTES UA: NEGATIVE
NITRITE: NEGATIVE
Total Protein, Urine: NEGATIVE
URINE GLUCOSE: NEGATIVE
UROBILINOGEN UA: 0.2 (ref 0.0–1.0)
pH: 5.5 (ref 5.0–8.0)

## 2013-10-20 LAB — CBC WITH DIFFERENTIAL/PLATELET
BASOS ABS: 0 10*3/uL (ref 0.0–0.1)
Basophils Relative: 0.5 % (ref 0.0–3.0)
EOS PCT: 5 % (ref 0.0–5.0)
Eosinophils Absolute: 0.2 10*3/uL (ref 0.0–0.7)
HCT: 45.2 % (ref 39.0–52.0)
HEMOGLOBIN: 15.2 g/dL (ref 13.0–17.0)
Lymphocytes Relative: 47.1 % — ABNORMAL HIGH (ref 12.0–46.0)
Lymphs Abs: 2.1 10*3/uL (ref 0.7–4.0)
MCHC: 33.6 g/dL (ref 30.0–36.0)
MCV: 101.1 fl — ABNORMAL HIGH (ref 78.0–100.0)
MONOS PCT: 8.6 % (ref 3.0–12.0)
Monocytes Absolute: 0.4 10*3/uL (ref 0.1–1.0)
Neutro Abs: 1.8 10*3/uL (ref 1.4–7.7)
Neutrophils Relative %: 38.8 % — ABNORMAL LOW (ref 43.0–77.0)
PLATELETS: 193 10*3/uL (ref 150.0–400.0)
RBC: 4.47 Mil/uL (ref 4.22–5.81)
RDW: 13.1 % (ref 11.5–15.5)
WBC: 4.5 10*3/uL (ref 4.0–10.5)

## 2013-10-20 LAB — BASIC METABOLIC PANEL
BUN: 19 mg/dL (ref 6–23)
CO2: 23 meq/L (ref 19–32)
CREATININE: 0.8 mg/dL (ref 0.4–1.5)
Calcium: 9 mg/dL (ref 8.4–10.5)
Chloride: 107 mEq/L (ref 96–112)
GFR: 99.15 mL/min (ref >60.00)
GLUCOSE: 109 mg/dL — AB (ref 70–99)
Potassium: 3.8 mEq/L (ref 3.5–5.1)
Sodium: 138 mEq/L (ref 135–145)

## 2013-10-20 LAB — LIPID PANEL
CHOLESTEROL: 166 mg/dL (ref 0–200)
HDL: 33.3 mg/dL — AB (ref >39.00)
LDL Cholesterol: 118 mg/dL — ABNORMAL HIGH (ref 0–99)
NonHDL: 132.7
Total CHOL/HDL Ratio: 5
Triglycerides: 72 mg/dL (ref 0.0–149.0)
VLDL: 14.4 mg/dL (ref 0.0–40.0)

## 2013-10-20 LAB — TSH: TSH: 0.95 u[IU]/mL (ref 0.35–4.50)

## 2013-10-20 LAB — HEPATIC FUNCTION PANEL
ALBUMIN: 3.4 g/dL — AB (ref 3.5–5.2)
ALT: 35 U/L (ref 0–53)
AST: 29 U/L (ref 0–37)
Alkaline Phosphatase: 63 U/L (ref 39–117)
Bilirubin, Direct: 0.2 mg/dL (ref 0.0–0.3)
TOTAL PROTEIN: 7 g/dL (ref 6.0–8.3)
Total Bilirubin: 0.9 mg/dL (ref 0.2–1.2)

## 2013-10-20 LAB — PSA: PSA: 0.01 ng/mL — AB (ref 0.10–4.00)

## 2013-10-20 NOTE — Progress Notes (Signed)
Pre visit review using our clinic review tool, if applicable. No additional management support is needed unless otherwise documented below in the visit note. 

## 2013-10-20 NOTE — Progress Notes (Signed)
   Subjective:     HPI  The patient is here for a wellness exam. The patient has been doing well overall without major physical or psychological issues going on lately.  Review of Systems  Constitutional: Negative for fever, appetite change, fatigue and unexpected weight change.  HENT: Negative for congestion, hearing loss, nosebleeds, sneezing, sore throat and trouble swallowing.   Eyes: Negative for itching and visual disturbance.  Respiratory: Negative for cough and chest tightness.   Cardiovascular: Negative for chest pain, palpitations and leg swelling.  Gastrointestinal: Negative for nausea, diarrhea, blood in stool and abdominal distention.  Genitourinary: Negative for frequency, hematuria, penile swelling and testicular pain.  Musculoskeletal: Negative for back pain, gait problem, joint swelling and neck pain.       L upper hip hurts at night x 2 mo  Skin: Negative for rash.  Neurological: Negative for dizziness, tremors, speech difficulty and weakness.  Psychiatric/Behavioral: Negative for suicidal ideas, hallucinations, sleep disturbance, dysphoric mood and agitation. The patient is not nervous/anxious.        Objective:   Physical Exam  Constitutional: He is oriented to person, place, and time. He appears well-developed and well-nourished. No distress.  HENT:  Head: Normocephalic and atraumatic.  Right Ear: External ear normal.  Left Ear: External ear normal.  Nose: Nose normal.  Mouth/Throat: Oropharynx is clear and moist. No oropharyngeal exudate.  Eyes: Conjunctivae and EOM are normal. Pupils are equal, round, and reactive to light. Right eye exhibits no discharge. Left eye exhibits no discharge. No scleral icterus.  Neck: Normal range of motion. Neck supple. No JVD present. No tracheal deviation present. No thyromegaly present.  Cardiovascular: Normal rate, regular rhythm, normal heart sounds and intact distal pulses.  Exam reveals no gallop and no friction rub.    No murmur heard. Pulmonary/Chest: Effort normal and breath sounds normal. No stridor. No respiratory distress. He has no wheezes. He has no rales. He exhibits no tenderness.  Abdominal: Soft. Bowel sounds are normal. He exhibits no distension and no mass. There is no tenderness. There is no rebound and no guarding.  Genitourinary:  Rect def to urol  Musculoskeletal: Normal range of motion. He exhibits no edema and no tenderness.  Lymphadenopathy:    He has no cervical adenopathy.  Neurological: He is alert and oriented to person, place, and time. He has normal reflexes. No cranial nerve deficit. He exhibits normal muscle tone. Coordination normal.  Skin: Skin is warm and dry. No rash noted. He is not diaphoretic. No erythema. No pallor.  Psychiatric: He has a normal mood and affect. His behavior is normal. Judgment and thought content normal.   Rectal is def to Dr Risa Grill   Lab Results  Component Value Date   WBC 4.5 10/14/2012   HGB 15.6 10/14/2012   HCT 45.0 10/14/2012   PLT 187.0 10/14/2012   CHOL 151 10/14/2012   TRIG 60.0 10/14/2012   HDL 40.90 10/14/2012   ALT 25 10/14/2012   AST 26 10/14/2012   NA 139 10/14/2012   K 4.0 10/14/2012   CL 108 10/14/2012   CREATININE 0.8 10/14/2012   BUN 10 10/14/2012   CO2 27 10/14/2012   TSH 1.18 10/14/2012   PSA 0.06* 08/19/2009   INR 1.0 ratio 03/27/2008        Assessment & Plan:

## 2013-10-20 NOTE — Assessment & Plan Note (Signed)
Labs

## 2013-10-20 NOTE — Patient Instructions (Signed)

## 2013-10-20 NOTE — Assessment & Plan Note (Signed)
Here for medicare wellness/physical  Diet: heart healthy  Physical activity: not sedentary - gym, golf Depression/mood screen: negative  Hearing: intact to whispered voice  Visual acuity: grossly normal, performs annual eye exam  ADLs: capable  Fall risk: none  Home safety: good  Cognitive evaluation: intact to orientation, naming, recall and repetition  EOL planning: adv directives, full code/ I agree  I have personally reviewed and have noted  1. The patient's medical and social history  2. Their use of alcohol, tobacco or illicit drugs  3. Their current medications and supplements  4. The patient's functional ability including ADL's, fall risks, home safety risks and hearing or visual impairment.  5. Diet and physical activities  6. Evidence for depression or mood disorders   The patient is here for annual Medicare wellness examination and management of other chronic and acute problems.   The risk factors are reflected in the social history.  The roster of all physicians providing medical care to patient - is listed in the Snapshot section of the chart.  Activities of daily living:  The patient is 100% inedpendent in all ADLs: dressing, toileting, feeding as well as independent mobility  Home safety : good. Using seatbelts. There is no violence in the home.   There is no risks for hepatitis, STDs or HIV. There is no history of blood transfusion. They have no travel history to infectious disease endemic areas of the world.  The patient has  seen their dentist in the last 12 month. They have  seen their eye doctor in the last year. They deny  Any major hearing difficulty and have not had audiologic testing in the last year.  They do not  have excessive sun exposure. Discussed the need for sun protection: hats, long sleeves and use of sunscreen if there is significant sun exposure.   Diet: the importance of a healthy diet is discussed. They do have a reasonably healthy  diet.  The  patient has a fairly regular exercise program of a mixed nature: walking, yard work, etc.The benefits of regular aerobic exercise were discussed.  Depression screen: there are no signs or vegative symptoms of depression- irritability, change in appetite, anhedonia, sadness/tearfullness.  Cognitive assessment: the patient manages all their financial and personal affairs and is actively engaged. They could relate day,date,year and events; recalled 3/3 objects at 3 minutes  The following portions of the patient's history were reviewed and updated as appropriate: allergies, current medications, past family history, past medical history,  past surgical history, past social history  and problem list.  Vision, hearing, body mass index were assessed and reviewed.   During the course of the visit the patient was educated and counseled about appropriate screening and preventive services including : fall prevention , diabetes screening, nutrition counseling, colorectal cancer screening, and recommended immunizations.

## 2013-10-20 NOTE — Assessment & Plan Note (Signed)
Dr Risa Grill 2006

## 2013-10-20 NOTE — Assessment & Plan Note (Signed)
Better  

## 2013-10-23 ENCOUNTER — Telehealth: Payer: Self-pay | Admitting: Internal Medicine

## 2013-10-23 NOTE — Telephone Encounter (Signed)
emmi emailed °

## 2014-02-19 ENCOUNTER — Ambulatory Visit (INDEPENDENT_AMBULATORY_CARE_PROVIDER_SITE_OTHER): Payer: Medicare Other | Admitting: Internal Medicine

## 2014-02-19 ENCOUNTER — Encounter: Payer: Self-pay | Admitting: Internal Medicine

## 2014-02-19 VITALS — BP 150/98 | HR 70 | Temp 98.5°F | Wt 183.0 lb

## 2014-02-19 DIAGNOSIS — R030 Elevated blood-pressure reading, without diagnosis of hypertension: Secondary | ICD-10-CM | POA: Insufficient documentation

## 2014-02-19 DIAGNOSIS — J3089 Other allergic rhinitis: Secondary | ICD-10-CM

## 2014-02-19 DIAGNOSIS — IMO0001 Reserved for inherently not codable concepts without codable children: Secondary | ICD-10-CM

## 2014-02-19 DIAGNOSIS — J0100 Acute maxillary sinusitis, unspecified: Secondary | ICD-10-CM

## 2014-02-19 DIAGNOSIS — R21 Rash and other nonspecific skin eruption: Secondary | ICD-10-CM

## 2014-02-19 MED ORDER — AMOXICILLIN-POT CLAVULANATE 875-125 MG PO TABS: 1.0000 | ORAL_TABLET | Freq: Two times a day (BID) | ORAL | Status: DC

## 2014-02-19 NOTE — Progress Notes (Addendum)
Subjective:    Sinusitis This is a new problem. The current episode started 1 to 4 weeks ago. The problem is unchanged. Associated symptoms include congestion, sinus pressure, sneezing and swollen glands. Pertinent negatives include no coughing, neck pain or sore throat. Past treatments include acetaminophen, oral decongestants and spray decongestants. The treatment provided mild relief.   C/o wound on R forearm  BP Readings from Last 3 Encounters:  02/19/14 150/98  10/20/13 140/72  09/07/13 150/98    Review of Systems  Constitutional: Negative for fever, appetite change, fatigue and unexpected weight change.  HENT: Positive for congestion, sinus pressure and sneezing. Negative for hearing loss, nosebleeds, sore throat and trouble swallowing.   Eyes: Negative for itching and visual disturbance.  Respiratory: Negative for cough and chest tightness.   Cardiovascular: Negative for chest pain, palpitations and leg swelling.  Gastrointestinal: Negative for nausea, diarrhea, blood in stool and abdominal distention.  Genitourinary: Negative for frequency, hematuria, penile swelling and testicular pain.  Musculoskeletal: Negative for back pain, joint swelling, gait problem and neck pain.       L upper hip hurts at night x 2 mo  Skin: Negative for rash.  Neurological: Negative for dizziness, tremors, speech difficulty and weakness.  Psychiatric/Behavioral: Negative for suicidal ideas, hallucinations, sleep disturbance, dysphoric mood and agitation. The patient is not nervous/anxious.        Objective:   Physical Exam  Constitutional: He is oriented to person, place, and time. He appears well-developed and well-nourished. No distress.  HENT:  Head: Normocephalic and atraumatic.  Right Ear: External ear normal.  Left Ear: External ear normal.  Nose: Nose normal.  Mouth/Throat: Oropharynx is clear and moist. No oropharyngeal exudate.  Nasal mucosa - erythema  Eyes: Conjunctivae and  EOM are normal. Pupils are equal, round, and reactive to light. Right eye exhibits no discharge. Left eye exhibits no discharge. No scleral icterus.  Neck: Normal range of motion. Neck supple. No JVD present. No tracheal deviation present. No thyromegaly present.  Cardiovascular: Normal rate, regular rhythm, normal heart sounds and intact distal pulses.  Exam reveals no gallop and no friction rub.   No murmur heard. Pulmonary/Chest: Effort normal and breath sounds normal. No stridor. No respiratory distress. He has no wheezes. He has no rales. He exhibits no tenderness.  Abdominal: Soft. Bowel sounds are normal. He exhibits no distension and no mass. There is no tenderness. There is no rebound and no guarding.  Musculoskeletal: Normal range of motion. He exhibits no edema or tenderness.  Lymphadenopathy:    He has no cervical adenopathy.  Neurological: He is alert and oriented to person, place, and time. He has normal reflexes. No cranial nerve deficit. He exhibits normal muscle tone. Coordination normal.  Skin: Skin is warm and dry. No rash noted. He is not diaphoretic. No erythema. No pallor.  Psychiatric: He has a normal mood and affect. His behavior is normal. Judgment and thought content normal.   wound on R forearm w/a scab and prox nodule   Lab Results  Component Value Date   WBC 4.5 10/20/2013   HGB 15.2 10/20/2013   HCT 45.2 10/20/2013   PLT 193.0 10/20/2013   CHOL 166 10/20/2013   TRIG 72.0 10/20/2013   HDL 33.30* 10/20/2013   ALT 35 10/20/2013   AST 29 10/20/2013   NA 138 10/20/2013   K 3.8 10/20/2013   CL 107 10/20/2013   CREATININE 0.8 10/20/2013   BUN 19 10/20/2013   CO2 23 10/20/2013  TSH 0.95 10/20/2013   PSA 0.01* 10/20/2013   INR 1.0 ratio 03/27/2008        Assessment & Plan:

## 2014-02-19 NOTE — Assessment & Plan Note (Signed)
wound on R forearm - healing scab -- dressed Skin bx if it didn't disappear in 3 wks

## 2014-02-19 NOTE — Assessment & Plan Note (Signed)
Continue with current prescription therapy as reflected on the Med list.  

## 2014-02-19 NOTE — Assessment & Plan Note (Signed)
PO Abx - see Rx

## 2014-02-19 NOTE — Addendum Note (Signed)
Addended by: Cassandria Anger on: 02/19/2014 10:53 AM   Modules accepted: Level of Service

## 2014-02-19 NOTE — Assessment & Plan Note (Signed)
Monitor BP 

## 2014-04-16 ENCOUNTER — Other Ambulatory Visit: Payer: Self-pay | Admitting: Internal Medicine

## 2014-08-13 ENCOUNTER — Ambulatory Visit (INDEPENDENT_AMBULATORY_CARE_PROVIDER_SITE_OTHER): Payer: Medicare Other | Admitting: Internal Medicine

## 2014-08-13 ENCOUNTER — Encounter: Payer: Self-pay | Admitting: Internal Medicine

## 2014-08-13 VITALS — BP 102/60 | HR 74 | Temp 97.6°F | Resp 16 | Ht 68.5 in | Wt 186.0 lb

## 2014-08-13 DIAGNOSIS — J3081 Allergic rhinitis due to animal (cat) (dog) hair and dander: Secondary | ICD-10-CM

## 2014-08-13 DIAGNOSIS — J0101 Acute recurrent maxillary sinusitis: Secondary | ICD-10-CM

## 2014-08-13 MED ORDER — AMOXICILLIN-POT CLAVULANATE 875-125 MG PO TABS: 1.0000 | ORAL_TABLET | Freq: Two times a day (BID) | ORAL | Status: DC

## 2014-08-13 MED ORDER — METHYLPREDNISOLONE 4 MG PO TBPK: ORAL_TABLET | ORAL | Status: DC

## 2014-08-13 NOTE — Patient Instructions (Signed)

## 2014-08-13 NOTE — Progress Notes (Signed)
Pre visit review using our clinic review tool, if applicable. No additional management support is needed unless otherwise documented below in the visit note. 

## 2014-08-14 NOTE — Progress Notes (Signed)
Subjective:  Patient ID: Jeremy [name], male    DOB: Jun 06, 1940  Age: 74 y.o. MRN: [id number]  CC: Allergic Rhinitis  and Sinusitis   HPI Jeremy [name] presents for two-week history of sneezing, nasal congestion, postnasal drip, facial pain, and runny nose. He has been using Afrin nasal spray in addition to Zyrtec and Flonase nasal spray with mild relief from his symptoms.  History Jeremy [name] has a past medical history of Prostate cancer; AK (actinic keratosis); Osteopenia; Allergic rhinitis; History of colonic polyps; and History of skin cancer.   He has past surgical history that includes XRT (2008); Insertion prostate radiation seed; Knee arthroscopy (Right); Knee surgery (Left); and Tonsillectomy.   His family history includes Cancer in his father; Cancer - Other in an other family member; Liver disease in his father.He reports that he has been smoking Cigars.  He has never used smokeless tobacco. He reports that he drinks about 0.6 oz of alcohol per week. He reports that he does not use illicit drugs.  Outpatient Prescriptions Prior to Visit  Medication Sig Dispense Refill  . Acetaminophen (APAP 500 PO) Take 1 tablet by mouth 2 (two) times daily.    . Cholecalciferol 1000 UNITS tablet Take 1,000 Units by mouth daily.      . fluticasone (FLONASE) 50 MCG/ACT nasal spray Place 1 spray into the nose daily. 16 g 3  . Omega-3 Fatty Acids (FISH OIL) 1000 MG CAPS Take 2 capsules by mouth daily.     Marland Kitchen amoxicillin-clavulanate (AUGMENTIN) 875-125 MG per tablet TAKE 1 TABLET BY MOUTH TWICE A DAY (Patient not taking: Reported on 08/13/2014) 20 tablet 0  . Diclofenac Sodium (PENNSAID) 2 % SOLN Place 2 application onto the skin 2 (two) times daily. (Patient not taking: Reported on 08/13/2014) 112 g 3  . loratadine (CLARITIN) 10 MG tablet Take 1 tablet (10 mg total) by mouth daily. (Patient not taking: Reported on 08/13/2014) 90 tablet 3   No facility-administered medications prior to visit.    ROS Review  of Systems  Constitutional: Negative.  Negative for fever, chills, diaphoresis, activity change, appetite change, fatigue and unexpected weight change.  HENT: Positive for congestion, postnasal drip, rhinorrhea, sinus pressure and sneezing. Negative for dental problem, facial swelling, mouth sores, nosebleeds, sore throat, tinnitus, trouble swallowing and voice change.   Eyes: Negative.   Respiratory: Negative.  Negative for cough, choking, chest tightness, shortness of breath and stridor.   Cardiovascular: Negative.  Negative for chest pain, palpitations and leg swelling.  Gastrointestinal: Negative.  Negative for abdominal pain.  Endocrine: Negative.   Genitourinary: Negative.   Musculoskeletal: Negative.   Skin: Negative.   Allergic/Immunologic: Negative.   Neurological: Negative.  Negative for dizziness.  Hematological: Negative.   Psychiatric/Behavioral: Negative.     Objective:  BP 102/60 mmHg  Pulse 74  Temp(Src) 97.6 F (36.4 C) (Oral)  Resp 16  Ht 5' 8.5" (1.74 m)  Wt 186 lb (84.369 kg)  BMI 27.87 kg/m2  SpO2 97%  Physical Exam  Constitutional: He is oriented to person, place, and time.  Non-toxic appearance. He does not have a sickly appearance. He does not appear ill. No distress.  HENT:  Right Ear: Hearing, tympanic membrane, external ear and ear canal normal.  Left Ear: Hearing, tympanic membrane, external ear and ear canal normal.  Nose: Mucosal edema present. No rhinorrhea, sinus tenderness, nasal deformity or nasal septal hematoma. No epistaxis. Right sinus exhibits maxillary sinus tenderness. Right sinus exhibits no frontal sinus tenderness. Left  sinus exhibits maxillary sinus tenderness. Left sinus exhibits no frontal sinus tenderness.  Mouth/Throat: Oropharynx is clear and moist and mucous membranes are normal. Mucous membranes are not pale, not dry and not cyanotic. No oral lesions. No trismus in the jaw. No uvula swelling. No oropharyngeal exudate, posterior  oropharyngeal edema, posterior oropharyngeal erythema or tonsillar abscesses.  Eyes: Conjunctivae are normal. Right eye exhibits no discharge. Left eye exhibits no discharge. No scleral icterus.  Neck: Normal range of motion. Neck supple. No JVD present. No tracheal deviation present. No thyromegaly present.  Cardiovascular: Normal rate, regular rhythm, normal heart sounds and intact distal pulses.  Exam reveals no gallop and no friction rub.   No murmur heard. Pulmonary/Chest: Effort normal and breath sounds normal. No stridor. No respiratory distress. He has no wheezes. He has no rales. He exhibits no tenderness.  Abdominal: Soft. Bowel sounds are normal. He exhibits no distension and no mass. There is no tenderness. There is no rebound and no guarding.  Musculoskeletal: Normal range of motion. He exhibits no edema or tenderness.  Lymphadenopathy:    He has no cervical adenopathy.  Neurological: He is oriented to person, place, and time.  Skin: Skin is warm and dry. No rash noted. He is not diaphoretic. No erythema. No pallor.      Assessment & Plan:   Jeremy [name] was seen today for allergic rhinitis  and sinusitis.  Diagnoses and all orders for this visit:  Acute recurrent maxillary sinusitis- he complains that he has about 3 sinus infections a year. Will treat this infectionwith Augmentin. I have referred him to allergy for further evaluation. Orders: -     Ambulatory referral to Allergy -     amoxicillin-clavulanate (AUGMENTIN) 875-125 MG per tablet; Take 1 tablet by mouth 2 (two) times daily.  Allergic rhinitis due to animal hair and dander- he is having a flare of his symptoms and will treat with Medrol Dosepak. I've also asked him to be evaluated by allergy. Orders: -     Ambulatory referral to Allergy -     methylPREDNISolone (MEDROL DOSEPAK) 4 MG TBPK tablet; TAKE AS DIRECTED   I have discontinued [name] loratadine, Diclofenac Sodium, and amoxicillin-clavulanate. I am also  having him start on methylPREDNISolone and amoxicillin-clavulanate. Additionally, I am having him maintain his Fish Oil, Cholecalciferol, fluticasone, and Acetaminophen (APAP 500 PO).  Meds ordered this encounter  Medications  . methylPREDNISolone (MEDROL DOSEPAK) 4 MG TBPK tablet    Sig: TAKE AS DIRECTED    Dispense:  21 tablet    Refill:  0  . amoxicillin-clavulanate (AUGMENTIN) 875-125 MG per tablet    Sig: Take 1 tablet by mouth 2 (two) times daily.    Dispense:  20 tablet    Refill:  0     Follow-up: Return in about 3 weeks (around 09/03/2014).  Scarlette Calico, MD

## 2014-09-21 DIAGNOSIS — J301 Allergic rhinitis due to pollen: Secondary | ICD-10-CM | POA: Insufficient documentation

## 2014-10-01 ENCOUNTER — Ambulatory Visit (INDEPENDENT_AMBULATORY_CARE_PROVIDER_SITE_OTHER): Payer: Medicare Other | Admitting: Family Medicine

## 2014-10-01 ENCOUNTER — Other Ambulatory Visit (INDEPENDENT_AMBULATORY_CARE_PROVIDER_SITE_OTHER): Payer: Medicare Other

## 2014-10-01 ENCOUNTER — Encounter: Payer: Self-pay | Admitting: Family Medicine

## 2014-10-01 VITALS — BP 128/80 | HR 73 | Ht 68.5 in | Wt 188.0 lb

## 2014-10-01 DIAGNOSIS — M7061 Trochanteric bursitis, right hip: Secondary | ICD-10-CM | POA: Diagnosis not present

## 2014-10-01 DIAGNOSIS — M25551 Pain in right hip: Secondary | ICD-10-CM

## 2014-10-01 MED ORDER — DICLOFENAC SODIUM 1 % TD GEL: 2.0000 g | Freq: Four times a day (QID) | TRANSDERMAL | Status: DC

## 2014-10-01 NOTE — Assessment & Plan Note (Signed)
Patient given injection today and tolerated the procedure very well. We discussed icing regimen, patient will try topical anti-inflammatory some prescription was sent in. We discussed icing regimen. Patient was near pain-free when he left today. I'm optimistic he'll do well. Patient otherwise will follow-up with me again in 3 weeks.

## 2014-10-01 NOTE — Progress Notes (Signed)
[name] [place], [place] [address] Phone: [phone] Subjective:    I'm seeing this patient by the request  of:  Walker Kehr, MD   CC: Hip pain  [id number] Jeremy [name] is a 73 y.o. male coming in with complaint of hip pain. righ sided lateral. Patient states that this is been going on for over a week now. States that it is waking him up at night. Only when he touches on the side or tries to go up or down stairs. Patient denies any radiation down the leg or any back pain that seems to be associated with it. Denies any weakness. Has not tried any home modalities at this time. Patient likes to remain active and is attempting to do so. Patient states if he starts walking it seems to get somewhat better.     Past medical history, social, surgical and family history all reviewed in electronic medical record.   Review of Systems: No headache, visual changes, nausea, vomiting, diarrhea, constipation, dizziness, abdominal pain, skin rash, fevers, chills, night sweats, weight loss, swollen lymph nodes, body aches, joint swelling, muscle aches, chest pain, shortness of breath, mood changes.   Objective Blood pressure 128/80, pulse 73, height 5' 8.5" (1.74 m), weight 188 lb (85.276 kg), SpO2 97 %.  General: No apparent distress alert and oriented x3 mood and affect normal, dressed appropriately.  HEENT: Pupils equal, extraocular movements intact  Respiratory: Patient's speak in full sentences and does not appear short of breath  Cardiovascular: No lower extremity edema, non tender, no erythema  Skin: Warm dry intact with no signs of infection or rash on extremities or on axial skeleton.  Abdomen: Soft nontender  Neuro: Cranial nerves II through XII are intact, neurovascularly intact in all extremities with 2+ DTRs and 2+ pulses.  Lymph: No lymphadenopathy of posterior or anterior cervical chain or axillae bilaterally.  Gait normal with good  balance and coordination.  MSK:  Non tender with full range of motion and good stability and symmetric strength and tone of shoulders, elbows, wrist,  knee and ankles bilaterally.  Hip: Right ROM IR: 15 Deg, ER: 35 Deg, Flexion: 120 Deg, Extension: 100 Deg, Abduction: 35 Deg, Adduction: 30 Deg Strength IR: 5/5, ER: 5/5, Flexion: 5/5, Extension: 5/5, Abduction: 4/5, Adduction: 4/5 Pelvic alignment unremarkable to inspection and palpation. Standing hip rotation and gait without trendelenburg sign / unsteadiness. Severe tenderness over the greater trochanter area on the right side. Positive Faber No SI joint tenderness and normal minimal SI movement.  MSK US performed of: Right This study was ordered, performed, and interpreted by Charlann Boxer D.O.  Hip: Trochanteric bursa with significant hypoechoic changes and swelling Acetabular labrum visualized and without tears, displacement, or effusion in joint. Femoral neck appears unremarkable without increased power doppler signal along Cortex.  IMPRESSION:  Greater trochanter bursitis   Procedure: Real-time Ultrasound Guided Injection of right greater trochanteric bursitis secondary to patient's body habitus Device: GE Logiq E  Ultrasound guided injection is preferred based studies that show increased duration, increased effect, greater accuracy, decreased procedural pain, increased response rate, and decreased cost with ultrasound guided versus blind injection.  Verbal informed consent obtained.  Time-out conducted.  Noted no overlying erythema, induration, or other signs of local infection.  Skin prepped in a sterile fashion.  Local anesthesia: Topical Ethyl chloride.  With sterile technique and under real time ultrasound guidance:  Greater trochanteric area was visualized and patient's bursa was noted. A  22-gauge 3 inch needle was inserted and 4 cc of 0.5% Marcaine and 1 cc of Kenalog 40 mg/dL was injected. Pictures taken Completed without  difficulty  Pain immediately resolved suggesting accurate placement of the medication.  Advised to call if fevers/chills, erythema, induration, drainage, or persistent bleeding.  Images permanently stored and available for review in the ultrasound unit.  Impression: Technically successful ultrasound guided injection.   Procedure note 75102; 15 minutes spent for Therapeutic exercises as stated in above notes.  This included exercises focusing on stretching, strengthening, with significant focus on eccentric aspects.   Hip strengthening exercises which included:  Pelvic tilt/bracing to help with proper recruitment of the lower abs and pelvic floor muscles  Glute strengthening to properly contract glutes without over-engaging low back and hamstrings - prone hip extension and glute bridge exercises Proper stretching techniques to increase effectiveness for the hip flexors, groin, quads, piriformis and low back when appropriate  Proper technique shown and discussed handout in great detail with ATC.  All questions were discussed and answered.      Impression and Recommendations:     This case required medical decision making of moderate complexity.

## 2014-10-01 NOTE — Progress Notes (Signed)
Pre visit review using our clinic review tool, if applicable. No additional management support is needed unless otherwise documented below in the visit note. 

## 2014-10-01 NOTE — Patient Instructions (Addendum)
Good to see you.  Ice 20 minutes 2 times daily. Usually after activity and before bed. Exercises 3 times a week.  voltaren gel 2 times daily as needed See me againin 4 weeks if not perfect

## 2014-10-09 ENCOUNTER — Ambulatory Visit (INDEPENDENT_AMBULATORY_CARE_PROVIDER_SITE_OTHER): Payer: Medicare Other | Admitting: Allergy and Immunology

## 2014-10-09 ENCOUNTER — Encounter: Payer: Self-pay | Admitting: Allergy and Immunology

## 2014-10-09 VITALS — BP 132/70 | HR 74 | Resp 16

## 2014-10-09 DIAGNOSIS — J3089 Other allergic rhinitis: Secondary | ICD-10-CM

## 2014-10-09 NOTE — Progress Notes (Signed)
Subjective:   Patient ID: Jeremy [name] returns to this clinic to assess his response to medical therapy for his allergic rhinitis established on 11 September 2014. While using a combination of montelukast, rhinocort, and nasal azelastine he has been doing quite well without any significant rhinitis. He can breath through his nose without much problem although his right nostril is always somewhat limited compared to his left nostril. He has not performed allergen avoidance measure directed against dust mite.  Past Medical History  Diagnosis Date  . Prostate cancer     hx of s/p XRT, seeds Dr. Risa [name]  . AK (actinic keratosis)   . Osteopenia   . Allergic rhinitis   . History of colonic polyps     Dr. Ardis [name]- Due 2018  . History of skin cancer     Past Surgical History  Procedure Laterality Date  . Xrt  2008  . Insertion prostate radiation seed    . Knee arthroscopy Right   . Knee surgery Left   . Tonsillectomy        Medication List       This list is accurate as of: 10/09/14  5:01 PM.  Always use your most recent med list.               APAP 500 PO  Take 1 tablet by mouth 2 (two) times daily.     azelastine 0.1 % nasal spray  Commonly known as:  ASTELIN  Place 1 spray into both nostrils daily. Use in each nostril as directed     Cholecalciferol 1000 UNITS tablet  Take 1,000 Units by mouth daily.     diclofenac sodium 1 % Gel  Commonly known as:  VOLTAREN  Apply 2 g topically 4 (four) times daily. To affected joint.     Fish Oil 1000 MG Caps  Take 2 capsules by mouth daily.     fluticasone 50 MCG/ACT nasal spray  Commonly known as:  FLONASE  Place 1 spray into the nose daily.     montelukast 10 MG tablet  Commonly known as:  SINGULAIR  Take 10 mg by mouth at bedtime.     RHINOCORT ALLERGY 32 MCG/ACT nasal spray  Generic drug:  budesonide  Place 1 spray into both nostrils daily.        Allergies  Allergen Reactions  . Aspirin     REACTION: gi       Review of Systems  HENT: Negative for congestion, ear pain, nosebleeds and sore throat.   Eyes: Negative for pain, discharge and redness.  Respiratory: Negative for cough, shortness of breath and wheezing.   Neurological: Negative for headaches.    Objective:   Filed Vitals:   10/09/14 1648  BP: 132/70  Pulse: 74  Resp: 16    Physical Exam  Constitutional: He is well-developed, well-nourished, and in no distress.  HENT:  Head: Normocephalic and atraumatic.  Right Ear: External ear normal.  Left Ear: External ear normal.  Nose: Nose normal.  Mouth/Throat: Oropharynx is clear and moist. No oropharyngeal exudate.  Eyes: Conjunctivae are normal. Pupils are equal, round, and reactive to light. Right eye exhibits no discharge. Left eye exhibits no discharge. No scleral icterus.  Neck: No JVD present. No tracheal deviation present. No thyromegaly present.  Cardiovascular: Normal rate, regular rhythm and normal heart sounds.  Exam reveals no gallop and no friction rub.   No murmur heard. Pulmonary/Chest: Effort normal. No stridor. No respiratory distress.  He has no wheezes. He has no rales. He exhibits no tenderness.  Lymphadenopathy:    He has no cervical adenopathy.         Assessment and Plan:   Allergic Rhinitis, well controlled on current therapy  1. Continue montelukast 10mg  one time per day 2. Continue OTC Rhinocort one spray each nostril one time per day 3. Continue nasal azelastine one -two sprays each nostril one time per day. 4. Perform dust mite avoidance measures 5. Get flu vaccine 6. Return in six months.

## 2014-10-09 NOTE — Patient Instructions (Signed)
1. Continue montelukast 10mg  one time per day 2. Continue OTC Rhinocort one spray each nostril one time per day 3. Continue nasal azelastine one -two sprays each nostril one time per day. 4. Perform dust mite avoidance measures 5. Get flu vaccine 6. Return in six months.

## 2014-10-15 ENCOUNTER — Ambulatory Visit: Payer: Medicare Other | Admitting: Family Medicine

## 2014-10-22 ENCOUNTER — Encounter: Payer: Self-pay | Admitting: Internal Medicine

## 2014-10-22 ENCOUNTER — Other Ambulatory Visit (INDEPENDENT_AMBULATORY_CARE_PROVIDER_SITE_OTHER): Payer: Medicare Other

## 2014-10-22 ENCOUNTER — Ambulatory Visit (INDEPENDENT_AMBULATORY_CARE_PROVIDER_SITE_OTHER): Payer: Medicare Other | Admitting: Internal Medicine

## 2014-10-22 VITALS — BP 140/90 | HR 62 | Wt 184.0 lb

## 2014-10-22 DIAGNOSIS — Z8546 Personal history of malignant neoplasm of prostate: Secondary | ICD-10-CM | POA: Diagnosis not present

## 2014-10-22 DIAGNOSIS — Z0000 Encounter for general adult medical examination without abnormal findings: Secondary | ICD-10-CM

## 2014-10-22 DIAGNOSIS — H6123 Impacted cerumen, bilateral: Secondary | ICD-10-CM

## 2014-10-22 DIAGNOSIS — Z23 Encounter for immunization: Secondary | ICD-10-CM

## 2014-10-22 DIAGNOSIS — E785 Hyperlipidemia, unspecified: Secondary | ICD-10-CM | POA: Diagnosis not present

## 2014-10-22 DIAGNOSIS — H6120 Impacted cerumen, unspecified ear: Secondary | ICD-10-CM | POA: Insufficient documentation

## 2014-10-22 LAB — URINALYSIS
Bilirubin Urine: NEGATIVE
KETONES UR: NEGATIVE
Leukocytes, UA: NEGATIVE
Nitrite: NEGATIVE
PH: 6 (ref 5.0–8.0)
Specific Gravity, Urine: 1.025 (ref 1.000–1.030)
Total Protein, Urine: NEGATIVE
URINE GLUCOSE: NEGATIVE
Urobilinogen, UA: 0.2 (ref 0.0–1.0)

## 2014-10-22 LAB — CBC WITH DIFFERENTIAL/PLATELET
BASOS ABS: 0 10*3/uL (ref 0.0–0.1)
Basophils Relative: 0.4 % (ref 0.0–3.0)
Eosinophils Absolute: 0.1 10*3/uL (ref 0.0–0.7)
Eosinophils Relative: 1.2 % (ref 0.0–5.0)
HEMATOCRIT: 46.8 % (ref 39.0–52.0)
Hemoglobin: 16 g/dL (ref 13.0–17.0)
Lymphocytes Relative: 33.9 % (ref 12.0–46.0)
Lymphs Abs: 1.8 10*3/uL (ref 0.7–4.0)
MCHC: 34.3 g/dL (ref 30.0–36.0)
MCV: 102.1 fl — AB (ref 78.0–100.0)
MONOS PCT: 7.7 % (ref 3.0–12.0)
Monocytes Absolute: 0.4 10*3/uL (ref 0.1–1.0)
NEUTROS PCT: 56.8 % (ref 43.0–77.0)
Neutro Abs: 3 10*3/uL (ref 1.4–7.7)
Platelets: 194 10*3/uL (ref 150.0–400.0)
RBC: 4.58 Mil/uL (ref 4.22–5.81)
RDW: 13.7 % (ref 11.5–15.5)
WBC: 5.2 10*3/uL (ref 4.0–10.5)

## 2014-10-22 LAB — BASIC METABOLIC PANEL
BUN: 16 mg/dL (ref 6–23)
CO2: 28 mEq/L (ref 19–32)
Calcium: 9.2 mg/dL (ref 8.4–10.5)
Chloride: 104 mEq/L (ref 96–112)
Creatinine, Ser: 0.8 mg/dL (ref 0.40–1.50)
GFR: 100.31 mL/min (ref >60.00)
Glucose, Bld: 98 mg/dL (ref 70–99)
Potassium: 4.4 mEq/L (ref 3.5–5.1)
SODIUM: 138 meq/L (ref 135–145)

## 2014-10-22 LAB — HEPATIC FUNCTION PANEL
ALK PHOS: 59 U/L (ref 39–117)
ALT: 22 U/L (ref 0–53)
AST: 20 U/L (ref 0–37)
Albumin: 4 g/dL (ref 3.5–5.2)
Bilirubin, Direct: 0.1 mg/dL (ref 0.0–0.3)
TOTAL PROTEIN: 7.1 g/dL (ref 6.0–8.3)
Total Bilirubin: 0.6 mg/dL (ref 0.2–1.2)

## 2014-10-22 LAB — LIPID PANEL
Cholesterol: 152 mg/dL (ref 0–200)
HDL: 43.6 mg/dL (ref >39.00)
LDL Cholesterol: 97 mg/dL (ref 0–99)
NONHDL: 108.21
Total CHOL/HDL Ratio: 3
Triglycerides: 56 mg/dL (ref 0.0–149.0)
VLDL: 11.2 mg/dL (ref 0.0–40.0)

## 2014-10-22 LAB — TSH: TSH: 1.34 u[IU]/mL (ref 0.35–4.50)

## 2014-10-22 NOTE — Assessment & Plan Note (Signed)
10/16 B Will irrigate

## 2014-10-22 NOTE — Progress Notes (Signed)
Subjective:  Patient ID: Jeremy [name], male    DOB: Jun 03, 1940  Age: 74 y.o. MRN: [id number]  CC: No chief complaint on file.   HPI ALMALIK WEISSBERG presents for a well exam  Outpatient Prescriptions Prior to Visit  Medication Sig Dispense Refill  . Acetaminophen (APAP 500 PO) Take 1 tablet by mouth 2 (two) times daily.    Marland Kitchen azelastine (ASTELIN) 0.1 % nasal spray Place 1 spray into both nostrils daily. Use in each nostril as directed    . budesonide (RHINOCORT ALLERGY) 32 MCG/ACT nasal spray Place 1 spray into both nostrils daily.    . Cholecalciferol 1000 UNITS tablet Take 1,000 Units by mouth daily.      . montelukast (SINGULAIR) 10 MG tablet Take 10 mg by mouth at bedtime.    . Omega-3 Fatty Acids (FISH OIL) 1000 MG CAPS Take 2 capsules by mouth daily.     . diclofenac sodium (VOLTAREN) 1 % GEL Apply 2 g topically 4 (four) times daily. To affected joint. (Patient not taking: Reported on 10/22/2014) 100 g 11  . fluticasone (FLONASE) 50 MCG/ACT nasal spray Place 1 spray into the nose daily. (Patient not taking: Reported on 10/22/2014) 16 g 3   No facility-administered medications prior to visit.    ROS Review of Systems  Constitutional: Negative for appetite change, fatigue and unexpected weight change.  HENT: Negative for congestion, nosebleeds, sneezing, sore throat and trouble swallowing.   Eyes: Negative for itching and visual disturbance.  Respiratory: Negative for cough, chest tightness and wheezing.   Cardiovascular: Negative for chest pain, palpitations and leg swelling.  Gastrointestinal: Negative for nausea, diarrhea, blood in stool and abdominal distention.  Genitourinary: Negative for frequency and hematuria.  Musculoskeletal: Negative for back pain, joint swelling, gait problem and neck pain.  Skin: Negative for rash.  Neurological: Negative for dizziness, tremors, speech difficulty and weakness.  Psychiatric/Behavioral: Negative for suicidal ideas, sleep  disturbance, dysphoric mood and agitation. The patient is not nervous/anxious.     Objective:  BP 140/90 mmHg  Pulse 62  Wt 184 lb (83.462 kg)  SpO2 96%  BP Readings from Last 3 Encounters:  10/22/14 140/90  10/09/14 132/70  10/01/14 128/80    Wt Readings from Last 3 Encounters:  10/22/14 184 lb (83.462 kg)  10/01/14 188 lb (85.276 kg)  08/13/14 186 lb (84.369 kg)    Physical Exam  Constitutional: He is oriented to person, place, and time. He appears well-developed and well-nourished. No distress.  HENT:  Head: Normocephalic and atraumatic.  Right Ear: External ear normal.  Left Ear: External ear normal.  Nose: Nose normal.  Mouth/Throat: Oropharynx is clear and moist. No oropharyngeal exudate.  Eyes: Conjunctivae and EOM are normal. Pupils are equal, round, and reactive to light. Right eye exhibits no discharge. Left eye exhibits no discharge. No scleral icterus.  Neck: Normal range of motion. Neck supple. No JVD present. No tracheal deviation present. No thyromegaly present.  Cardiovascular: Normal rate, regular rhythm, normal heart sounds and intact distal pulses.  Exam reveals no gallop and no friction rub.   No murmur heard. Pulmonary/Chest: Effort normal and breath sounds normal. No stridor. No respiratory distress. He has no wheezes. He has no rales. He exhibits no tenderness.  Abdominal: Soft. Bowel sounds are normal. He exhibits no distension and no mass. There is no tenderness. There is no rebound and no guarding.  Genitourinary: Guaiac positive stool.  Musculoskeletal: Normal range of motion. He exhibits no edema or tenderness.  Lymphadenopathy:    He has no cervical adenopathy.  Neurological: He is alert and oriented to person, place, and time. He has normal reflexes. No cranial nerve deficit. He exhibits normal muscle tone. Coordination normal.  Skin: Skin is warm and dry. No rash noted. He is not diaphoretic. No erythema. No pallor.  Psychiatric: He has a normal  mood and affect. His behavior is normal. Judgment and thought content normal.  Rectal - per Dr Risa Grill B wax Lab Results  Component Value Date   WBC 5.2 10/22/2014   HGB 16.0 10/22/2014   HCT 46.8 10/22/2014   PLT 194.0 10/22/2014   GLUCOSE 98 10/22/2014   CHOL 152 10/22/2014   TRIG 56.0 10/22/2014   HDL 43.60 10/22/2014   LDLCALC 97 10/22/2014   ALT 22 10/22/2014   AST 20 10/22/2014   NA 138 10/22/2014   K 4.4 10/22/2014   CL 104 10/22/2014   CREATININE 0.80 10/22/2014   BUN 16 10/22/2014   CO2 28 10/22/2014   TSH 1.34 10/22/2014   PSA 0.01* 10/20/2013   INR 1.0 ratio 03/27/2008    Dg Knee Complete 4 Views Left  02/03/2013   CLINICAL DATA:  One month history of progressive left knee pain.  EXAM: LEFT KNEE - COMPLETE 4+ VIEW  COMPARISON:  None.  FINDINGS: The bones of the knee are adequately mineralized. There are mild degenerative changes present involving the medial lateral joint compartments. There is beaking of the tibial spines. There is faint chondrocalcinosis of the menisci. There is a tiny spur from the superior margin of the patella. There is no significant joint effusion. There is no evidence of an acute or healing fracture.  IMPRESSION: There are mild degenerative changes involving the medial and lateral joint compartments. No acute bony abnormality is demonstrated.   Electronically Signed   By: David  Martinique   On: 02/03/2013 08:48    Procedure Note :     Procedure :  Ear irrigation   Indication:  Cerumen impaction B   Risks, including pain, dizziness, eardrum perforation, bleeding, infection and others as well as benefits were explained to the patient in detail. Verbal consent was obtained and the patient agreed to proceed.    We used "The Elephant Ear Irrigation Device" filled with lukewarm water for irrigation. A large amount wax was recovered. Procedure has also required manual wax removal with an ear loop.   Tolerated well. Complications: None.   Postprocedure  instructions :  Call if problems.    Assessment & Plan:   Diagnoses and all orders for this visit:  Well adult exam -     Basic metabolic panel; Future -     CBC with Differential/Platelet; Future -     Hepatic function panel; Future -     Lipid panel; Future -     TSH; Future -     Urinalysis; Future  Need for influenza vaccination -     Flu Vaccine QUAD 36+ mos IM  PROSTATE CANCER, HX OF  Cerumen impaction, bilateral   I am having Mr. Treu maintain his Fish Oil, Cholecalciferol, fluticasone, Acetaminophen (APAP 500 PO), diclofenac sodium, montelukast, budesonide, and azelastine.  No orders of the defined types were placed in this encounter.     Follow-up: Return in about 6 months (around 04/22/2015) for a follow-up visit.  Walker Kehr, MD

## 2014-10-22 NOTE — Patient Instructions (Signed)

## 2014-10-22 NOTE — Assessment & Plan Note (Signed)
PSA 0 

## 2014-10-22 NOTE — Assessment & Plan Note (Addendum)
Here for medicare wellness/physical  Diet: heart healthy  Physical activity: not sedentary - gym, golf Depression/mood screen: negative  Hearing: intact to whispered voice  Visual acuity: grossly normal, performs annual eye exam  ADLs: capable  Fall risk: none  Home safety: good  Cognitive evaluation: intact to orientation, naming, recall and repetition  EOL planning: adv directives, full code/ I agree  I have personally reviewed and have noted  1. The patient's medical and social history  2. Their use of alcohol, tobacco or illicit drugs  3. Their current medications and supplements  4. The patient's functional ability including ADL's, fall risks, home safety risks and hearing or visual impairment.  5. Diet and physical activities  6. Evidence for depression or mood disorders 7. Providers roster was reviewed   The patient is here for annual Medicare wellness examination and management of other chronic and acute problems.   The risk factors are reflected in the social history.  The roster of all physicians providing medical care to patient - is listed in the Snapshot section of the chart.  Activities of daily living:  The patient is 100% inedpendent in all ADLs: dressing, toileting, feeding as well as independent mobility  Home safety : good. Using seatbelts. There is no violence in the home.   There is no risks for hepatitis, STDs or HIV. There is no history of blood transfusion. They have no travel history to infectious disease endemic areas of the world.  The patient has  seen their dentist in the last 12 month. They have  seen their eye doctor in the last year. They deny  Any major hearing difficulty and have not had audiologic testing in the last year.  They do not  have excessive sun exposure. Discussed the need for sun protection: hats, long sleeves and use of sunscreen if there is significant sun exposure.   Diet: the importance of a healthy diet is discussed. They do have  a reasonably healthy  diet.  The patient has a fairly regular exercise program of a mixed nature: walking, yard work, etc.The benefits of regular aerobic exercise were discussed.  Depression screen: there are no signs or vegative symptoms of depression- irritability, change in appetite, anhedonia, sadness/tearfullness.  Cognitive assessment: the patient manages all their financial and personal affairs and is actively engaged. They could relate day,date,year and events; recalled 3/3 objects at 3 minutes  The following portions of the patient's history were reviewed and updated as appropriate: allergies, current medications, past family history, past medical history,  past surgical history, past social history  and problem list.  Vision, hearing, body mass index were assessed and reviewed.   During the course of the visit the patient was educated and counseled about appropriate screening and preventive services including : fall prevention , diabetes screening, nutrition counseling, colorectal cancer screening, and recommended immunizations.  Colon due 2018

## 2014-10-22 NOTE — Progress Notes (Signed)
Pre visit review using our clinic review tool, if applicable. No additional management support is needed unless otherwise documented below in the visit note. 

## 2015-02-06 DIAGNOSIS — D485 Neoplasm of uncertain behavior of skin: Secondary | ICD-10-CM | POA: Diagnosis not present

## 2015-02-06 DIAGNOSIS — C44622 Squamous cell carcinoma of skin of right upper limb, including shoulder: Secondary | ICD-10-CM | POA: Diagnosis not present

## 2015-02-26 ENCOUNTER — Ambulatory Visit (INDEPENDENT_AMBULATORY_CARE_PROVIDER_SITE_OTHER): Payer: Medicare HMO | Admitting: Allergy and Immunology

## 2015-02-26 ENCOUNTER — Encounter: Payer: Self-pay | Admitting: Allergy and Immunology

## 2015-02-26 VITALS — BP 108/72 | HR 72 | Resp 18

## 2015-02-26 DIAGNOSIS — J3089 Other allergic rhinitis: Secondary | ICD-10-CM | POA: Diagnosis not present

## 2015-02-26 MED ORDER — AZELASTINE HCL 0.1 % NA SOLN: NASAL | Status: DC

## 2015-02-26 MED ORDER — METHYLPREDNISOLONE ACETATE 80 MG/ML IJ SUSP
80.0000 mg | Freq: Once | INTRAMUSCULAR | Status: AC
  Administered 2015-02-26: 80 mg via INTRAMUSCULAR

## 2015-02-26 NOTE — Patient Instructions (Signed)
  1. Depo-Medrol 80 IM delivered in the clinic today  2. Rhinocort one spray each nostril one time per day  3. Montelukast 10 mg one tablet one time per day  4. If needed:   A. Astelin 2 sprays each nostril twice a day  B. over-the-counter Claritin or Zyrtec 10 mg one time per day  C. nasal saline  5. Further treatment?  6. Return to clinic in 1 year or earlier if problem

## 2015-02-26 NOTE — Progress Notes (Signed)
Follow-up Note  Referring Provider: Cassandria Anger, MD Primary Provider: Walker Kehr, MD Date of Office Visit: 02/26/2015  Subjective:   Jeremy [name] (DOB: [birth year]) is a 75 y.o. male who returns to [facility] on 02/26/2015 in re-evaluation of the following:  HPI Comments: Jeremy [name] returns to this clinic in evaluation of his allergic rhinitis. Over the course the past 2 weeks he's developed rather significant problems with nasal congestion and some sneezing without any fever or ugly nasal discharge or anosmia. He has been playing golf outdoors during this timeframe. On previous evaluation he was found to be allergic to tree pollen. He has been consistently using his Rhinocort and montelukast.   Current Outpatient Prescriptions on File Prior to Visit  Medication Sig Dispense Refill  . Cholecalciferol 1000 UNITS tablet Take 1,000 Units by mouth daily.      . montelukast (SINGULAIR) 10 MG tablet Take 10 mg by mouth at bedtime.    . Omega-3 Fatty Acids (FISH OIL) 1000 MG CAPS Take 2 capsules by mouth daily.     . Acetaminophen (APAP 500 PO) Take 1 tablet by mouth 2 (two) times daily. Reported on 02/26/2015    . budesonide (RHINOCORT ALLERGY) 32 MCG/ACT nasal spray Place 1 spray into both nostrils daily. Reported on 02/26/2015     No current facility-administered medications on file prior to visit.    Meds ordered this encounter  Medications  . methylPREDNISolone acetate (DEPO-MEDROL) injection 80 mg    Sig:   . azelastine (ASTELIN) 0.1 % nasal spray    Sig: Use two sprays in each nostril twice daily as needed for runny nose and sneezing.    Dispense:  30 mL    Refill:  11    Past Medical History  Diagnosis Date  . Prostate cancer ([place])     hx of s/p XRT, seeds Dr. Risa Grill  . AK (actinic keratosis)   . Osteopenia   . Allergic rhinitis   . History of colonic polyps     Dr. Ardis Hughs- Due 2018  . History of skin cancer     Past Surgical History    Procedure Laterality Date  . Xrt  2008  . Insertion prostate radiation seed    . Knee arthroscopy Right   . Knee surgery Left   . Tonsillectomy      Allergies  Allergen Reactions  . Aspirin     REACTION: gi    Review of systems negative except as noted in HPI / PMHx or noted below:  Review of Systems  Constitutional: Negative.   HENT: Negative.   Eyes: Negative.   Respiratory: Negative.   Cardiovascular: Negative.   Gastrointestinal: Negative.   Genitourinary: Negative.   Musculoskeletal: Negative.   Skin: Negative.   Neurological: Negative.   Endo/Heme/Allergies: Negative.   Psychiatric/Behavioral: Negative.      Objective:   Filed Vitals:   02/26/15 1709  BP: 108/72  Pulse: 72  Resp: 18          Physical Exam  Constitutional: He is well-developed, well-nourished, and in no distress.  Nasal voice  HENT:  Head: Normocephalic.  Right Ear: Tympanic membrane, external ear and ear canal normal.  Left Ear: Tympanic membrane, external ear and ear canal normal.  Nose: Mucosal edema present. No rhinorrhea.  Mouth/Throat: Uvula is midline, oropharynx is clear and moist and mucous membranes are normal. No oropharyngeal exudate.  Eyes: Conjunctivae are normal.  Neck: Trachea normal. No tracheal tenderness  present. No tracheal deviation present. No thyromegaly present.  Cardiovascular: Normal rate, regular rhythm, S1 normal, S2 normal and normal heart sounds.   No murmur heard. Pulmonary/Chest: Breath sounds normal. No stridor. No respiratory distress. He has no wheezes. He has no rales.  Musculoskeletal: He exhibits no edema.  Lymphadenopathy:       Head (right side): No tonsillar adenopathy present.       Head (left side): No tonsillar adenopathy present.    He has no cervical adenopathy.    He has no axillary adenopathy.  Neurological: He is alert. Gait normal.  Skin: No rash noted. He is not diaphoretic. No erythema. Nails show no clubbing.  Psychiatric:  Mood and affect normal.    Diagnostics: None   Assessment and Plan:   1. Other allergic rhinitis      1. Depo-Medrol 80 IM delivered in the clinic today  2. Rhinocort one spray each nostril one time per day  3. Montelukast 10 mg one tablet one time per day  4. If needed:   A. Astelin 2 sprays each nostril twice a day  B. over-the-counter Claritin or Zyrtec 10 mg one time per day  C. nasal saline  5. Further treatment?  6. Return to clinic in 1 year or earlier if problem  I will assume that can will do well with anti-inflammatory therapy delivered to his respiratory tract as stated above. 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 I will see him back in this clinic in 1 year or earlier if there is a problem. Allena Katz, MD [place]

## 2015-04-04 DIAGNOSIS — C44622 Squamous cell carcinoma of skin of right upper limb, including shoulder: Secondary | ICD-10-CM | POA: Diagnosis not present

## 2015-04-09 ENCOUNTER — Ambulatory Visit: Payer: Medicare Other | Admitting: Allergy and Immunology

## 2015-04-22 ENCOUNTER — Encounter: Payer: Self-pay | Admitting: Internal Medicine

## 2015-04-22 ENCOUNTER — Ambulatory Visit (INDEPENDENT_AMBULATORY_CARE_PROVIDER_SITE_OTHER): Payer: Medicare HMO | Admitting: Internal Medicine

## 2015-04-22 VITALS — BP 130/66 | HR 74 | Wt 189.0 lb

## 2015-04-22 DIAGNOSIS — E785 Hyperlipidemia, unspecified: Secondary | ICD-10-CM | POA: Diagnosis not present

## 2015-04-22 DIAGNOSIS — R030 Elevated blood-pressure reading, without diagnosis of hypertension: Secondary | ICD-10-CM | POA: Diagnosis not present

## 2015-04-22 DIAGNOSIS — Z0000 Encounter for general adult medical examination without abnormal findings: Secondary | ICD-10-CM

## 2015-04-22 DIAGNOSIS — Z23 Encounter for immunization: Secondary | ICD-10-CM

## 2015-04-22 DIAGNOSIS — IMO0001 Reserved for inherently not codable concepts without codable children: Secondary | ICD-10-CM

## 2015-04-22 DIAGNOSIS — J301 Allergic rhinitis due to pollen: Secondary | ICD-10-CM | POA: Diagnosis not present

## 2015-04-22 NOTE — Assessment & Plan Note (Signed)
BP Readings from Last 3 Encounters:  04/22/15 130/66  02/26/15 108/72  10/22/14 140/90

## 2015-04-22 NOTE — Progress Notes (Signed)
Subjective:  Patient ID: Jeremy [name], male    DOB: November 16, 1940  Age: 75 y.o. MRN: [id number]  CC: No chief complaint on file.   HPI Jeremy [name] presents for allergies f/u  Outpatient Prescriptions Prior to Visit  Medication Sig Dispense Refill  . Acetaminophen (APAP 500 PO) Take 1 tablet by mouth 2 (two) times daily. Reported on 02/26/2015    . azelastine (ASTELIN) 0.1 % nasal spray Use two sprays in each nostril twice daily as needed for runny nose and sneezing. 30 mL 11  . budesonide (RHINOCORT ALLERGY) 32 MCG/ACT nasal spray Place 1 spray into both nostrils daily. Reported on 02/26/2015    . Cholecalciferol 1000 UNITS tablet Take 1,000 Units by mouth daily.      . Omega-3 Fatty Acids (FISH OIL) 1000 MG CAPS Take 2 capsules by mouth daily.     . montelukast (SINGULAIR) 10 MG tablet Take 10 mg by mouth at bedtime. Reported on 04/22/2015    . azelastine (ASTELIN) 0.1 % nasal spray Place 1 spray into both nostrils daily. Reported on 02/26/2015     No facility-administered medications prior to visit.    ROS Review of Systems  Constitutional: Negative for appetite change, fatigue and unexpected weight change.  HENT: Positive for congestion. Negative for nosebleeds, sneezing, sore throat and trouble swallowing.   Eyes: Negative for itching and visual disturbance.  Respiratory: Negative for cough.   Cardiovascular: Negative for chest pain, palpitations and leg swelling.  Gastrointestinal: Negative for nausea, diarrhea, blood in stool and abdominal distention.  Genitourinary: Negative for frequency and hematuria.  Musculoskeletal: Negative for back pain, joint swelling, gait problem and neck pain.  Skin: Negative for rash.  Neurological: Negative for dizziness, tremors, speech difficulty and weakness.  Psychiatric/Behavioral: Negative for sleep disturbance, dysphoric mood and agitation. The patient is not nervous/anxious.     Objective:  BP 130/66 mmHg  Pulse 74  Wt 189  lb (85.73 kg)  SpO2 96%  BP Readings from Last 3 Encounters:  04/22/15 130/66  02/26/15 108/72  10/22/14 140/90    Wt Readings from Last 3 Encounters:  04/22/15 189 lb (85.73 kg)  10/22/14 184 lb (83.462 kg)  10/01/14 188 lb (85.276 kg)    Physical Exam  Constitutional: He is oriented to person, place, and time. He appears well-developed. No distress.  NAD  HENT:  Mouth/Throat: Oropharynx is clear and moist.  Eyes: Conjunctivae are normal. Pupils are equal, round, and reactive to light.  Neck: Normal range of motion. No JVD present. No thyromegaly present.  Cardiovascular: Normal rate, regular rhythm, normal heart sounds and intact distal pulses.  Exam reveals no gallop and no friction rub.   No murmur heard. Pulmonary/Chest: Effort normal and breath sounds normal. No respiratory distress. He has no wheezes. He has no rales. He exhibits no tenderness.  Abdominal: Soft. Bowel sounds are normal. He exhibits no distension and no mass. There is no tenderness. There is no rebound and no guarding.  Musculoskeletal: Normal range of motion. He exhibits no edema or tenderness.  Lymphadenopathy:    He has no cervical adenopathy.  Neurological: He is alert and oriented to person, place, and time. He has normal reflexes. No cranial nerve deficit. He exhibits normal muscle tone. He displays a negative Romberg sign. Coordination and gait normal.  Skin: Skin is warm and dry. No rash noted.  Psychiatric: He has a normal mood and affect. His behavior is normal. Judgment and thought content normal.    Lab  Results  Component Value Date   WBC 5.2 10/22/2014   HGB 16.0 10/22/2014   HCT 46.8 10/22/2014   PLT 194.0 10/22/2014   GLUCOSE 98 10/22/2014   CHOL 152 10/22/2014   TRIG 56.0 10/22/2014   HDL 43.60 10/22/2014   LDLCALC 97 10/22/2014   ALT 22 10/22/2014   AST 20 10/22/2014   NA 138 10/22/2014   K 4.4 10/22/2014   CL 104 10/22/2014   CREATININE 0.80 10/22/2014   BUN 16 10/22/2014    CO2 28 10/22/2014   TSH 1.34 10/22/2014   PSA 0.01* 10/20/2013   INR 1.0 ratio 03/27/2008    Dg Knee Complete 4 Views Left  02/03/2013  CLINICAL DATA:  One month history of progressive left knee pain. EXAM: LEFT KNEE - COMPLETE 4+ VIEW COMPARISON:  None. FINDINGS: The bones of the knee are adequately mineralized. There are mild degenerative changes present involving the medial lateral joint compartments. There is beaking of the tibial spines. There is faint chondrocalcinosis of the menisci. There is a tiny spur from the superior margin of the patella. There is no significant joint effusion. There is no evidence of an acute or healing fracture. IMPRESSION: There are mild degenerative changes involving the medial and lateral joint compartments. No acute bony abnormality is demonstrated. Electronically Signed   By: David  Martinique   On: 02/03/2013 08:48    Assessment & Plan:   Diagnoses and all orders for this visit:  Elevated BP  Allergic rhinitis due to pollen  Well adult exam -     Urinalysis; Future -     TSH; Future -     Basic metabolic panel; Future -     CBC with Differential/Platelet; Future -     Hepatic function panel; Future -     Lipid panel; Future  Dyslipidemia -     Lipid panel; Future  Need for prophylactic vaccination against Streptococcus pneumoniae (pneumococcus) -     Pneumococcal conjugate vaccine 13-valent IM  I am having [name] maintain his Fish Oil, Cholecalciferol, Acetaminophen (APAP 500 PO), montelukast, budesonide, and azelastine.  No orders of the defined types were placed in this encounter.     Follow-up: Return in about 6 months (around 10/22/2015) for Wellness Exam.  Walker Kehr, MD

## 2015-04-22 NOTE — Assessment & Plan Note (Addendum)
Claritin  Flonase

## 2015-04-22 NOTE — Progress Notes (Signed)
Pre visit review using our clinic review tool, if applicable. No additional management support is needed unless otherwise documented below in the visit note. 

## 2015-05-22 DIAGNOSIS — Z0000 Encounter for general adult medical examination without abnormal findings: Secondary | ICD-10-CM | POA: Diagnosis not present

## 2015-05-22 DIAGNOSIS — Z6829 Body mass index (BMI) 29.0-29.9, adult: Secondary | ICD-10-CM | POA: Diagnosis not present

## 2015-06-06 DIAGNOSIS — R69 Illness, unspecified: Secondary | ICD-10-CM | POA: Diagnosis not present

## 2015-09-11 ENCOUNTER — Telehealth: Payer: Self-pay

## 2015-09-11 NOTE — Telephone Encounter (Signed)
Call to Jeremy [name] to let him know I would not be able to see him p Dr. Judeen Hammans visit on 10/12 but Dr. Alain Marion may complete his AWV or he can reschedule when he leaves. Stated that would be fine

## 2015-09-19 DIAGNOSIS — H524 Presbyopia: Secondary | ICD-10-CM | POA: Diagnosis not present

## 2015-09-24 ENCOUNTER — Encounter: Payer: Self-pay | Admitting: Internal Medicine

## 2015-09-24 ENCOUNTER — Ambulatory Visit (INDEPENDENT_AMBULATORY_CARE_PROVIDER_SITE_OTHER): Payer: Medicare HMO | Admitting: Internal Medicine

## 2015-09-24 DIAGNOSIS — H60392 Other infective otitis externa, left ear: Secondary | ICD-10-CM

## 2015-09-24 DIAGNOSIS — H60399 Other infective otitis externa, unspecified ear: Secondary | ICD-10-CM | POA: Insufficient documentation

## 2015-09-24 MED ORDER — DOXYCYCLINE HYCLATE 100 MG PO TABS: 100.0000 mg | ORAL_TABLET | Freq: Two times a day (BID) | ORAL | 0 refills | Status: DC

## 2015-09-24 MED ORDER — NEOMYCIN-POLYMYXIN-HC 3.5-10000-1 OT SOLN: 3.0000 [drp] | Freq: Four times a day (QID) | OTIC | 1 refills | Status: AC

## 2015-09-24 NOTE — Assessment & Plan Note (Addendum)
Cortisporin otic Q tip Doxy x 7 d RTC if not better - ENT ref

## 2015-09-24 NOTE — Progress Notes (Signed)
Subjective:  Patient ID: Jeremy [name], male    DOB: May 07, 1940  Age: 75 y.o. MRN: [id number]  CC: Ear Pain (LEFT EAR OFF AND ON PAIN, FEELS LIKE SOMEHTING IS IN HIS EAR. )   HPI Jeremy [name] presents for L ear feeling clogged x 6 d  Outpatient Medications Prior to Visit  Medication Sig Dispense Refill  . Acetaminophen (APAP 500 PO) Take 1 tablet by mouth 2 (two) times daily. Reported on 02/26/2015    . azelastine (ASTELIN) 0.1 % nasal spray Use two sprays in each nostril twice daily as needed for runny nose and sneezing. 30 mL 11  . budesonide (RHINOCORT ALLERGY) 32 MCG/ACT nasal spray Place 1 spray into both nostrils daily. Reported on 02/26/2015    . Cholecalciferol 1000 UNITS tablet Take 1,000 Units by mouth daily.      . montelukast (SINGULAIR) 10 MG tablet Take 10 mg by mouth at bedtime. Reported on 04/22/2015    . Omega-3 Fatty Acids (FISH OIL) 1000 MG CAPS Take 2 capsules by mouth daily.      No facility-administered medications prior to visit.     ROS Review of Systems  Constitutional: Negative for chills, fatigue, fever and unexpected weight change.  HENT: Positive for ear pain and hearing loss. Negative for ear discharge, facial swelling, nosebleeds, postnasal drip, rhinorrhea, sore throat, trouble swallowing and voice change.     Objective:  BP 122/78   Pulse 66   Temp 98.6 F (37 C)   Wt 186 lb (84.4 kg)   SpO2 97%   BMI 27.87 kg/m   BP Readings from Last 3 Encounters:  09/24/15 122/78  04/22/15 130/66  02/26/15 108/72    Wt Readings from Last 3 Encounters:  09/24/15 186 lb (84.4 kg)  04/22/15 189 lb (85.7 kg)  10/22/14 184 lb (83.5 kg)    Physical Exam  Constitutional: He is oriented to person, place, and time. He appears well-developed. No distress.  NAD  HENT:  Right Ear: External ear normal.  Mouth/Throat: Oropharynx is clear and moist. No oropharyngeal exudate.  Eyes: Conjunctivae are normal. Pupils are equal, round, and reactive to  light.  Neck: Normal range of motion. No JVD present. No thyromegaly present.  Cardiovascular: Normal rate, regular rhythm, normal heart sounds and intact distal pulses.  Exam reveals no gallop and no friction rub.   No murmur heard. Pulmonary/Chest: Effort normal and breath sounds normal. No respiratory distress. He has no wheezes. He has no rales. He exhibits no tenderness.  Abdominal: Soft. Bowel sounds are normal. He exhibits no distension and no mass. There is no tenderness. There is no rebound and no guarding.  Musculoskeletal: Normal range of motion. He exhibits no edema or tenderness.  Lymphadenopathy:    He has no cervical adenopathy.  Neurological: He is alert and oriented to person, place, and time. He has normal reflexes. No cranial nerve deficit. He exhibits normal muscle tone. He displays a negative Romberg sign. Coordination and gait normal.  Skin: Skin is warm and dry. No rash noted.  Psychiatric: He has a normal mood and affect. His behavior is normal. Judgment and thought content normal.  L ear with closed ear canal. Probed w/a Q-tip - NT; no d/c  Lab Results  Component Value Date   WBC 5.2 10/22/2014   HGB 16.0 10/22/2014   HCT 46.8 10/22/2014   PLT 194.0 10/22/2014   GLUCOSE 98 10/22/2014   CHOL 152 10/22/2014   TRIG 56.0 10/22/2014  HDL 43.60 10/22/2014   LDLCALC 97 10/22/2014   ALT 22 10/22/2014   AST 20 10/22/2014   NA 138 10/22/2014   K 4.4 10/22/2014   CL 104 10/22/2014   CREATININE 0.80 10/22/2014   BUN 16 10/22/2014   CO2 28 10/22/2014   TSH 1.34 10/22/2014   PSA 0.01 (L) 10/20/2013   INR 1.0 ratio 03/27/2008    Dg Knee Complete 4 Views Left  Result Date: 02/03/2013 CLINICAL DATA:  One month history of progressive left knee pain. EXAM: LEFT KNEE - COMPLETE 4+ VIEW COMPARISON:  None. FINDINGS: The bones of the knee are adequately mineralized. There are mild degenerative changes present involving the medial lateral joint compartments. There is beaking  of the tibial spines. There is faint chondrocalcinosis of the menisci. There is a tiny spur from the superior margin of the patella. There is no significant joint effusion. There is no evidence of an acute or healing fracture. IMPRESSION: There are mild degenerative changes involving the medial and lateral joint compartments. No acute bony abnormality is demonstrated. Electronically Signed   By: David  Martinique   On: 02/03/2013 08:48    Assessment & Plan:   Jeremy [name] was seen today for ear pain.  Diagnoses and all orders for this visit:  Otitis, externa, infective, left  Other orders -     neomycin-polymyxin-hydrocortisone (CORTISPORIN) otic solution; Place 3 drops into the left ear 4 (four) times daily. -     doxycycline (VIBRA-TABS) 100 MG tablet; Take 1 tablet (100 mg total) by mouth 2 (two) times daily.   I am having Jeremy [name] start on neomycin-polymyxin-hydrocortisone and doxycycline. I am also having him maintain his Fish Oil, Cholecalciferol, Acetaminophen (APAP 500 PO), montelukast, budesonide, and azelastine.  Meds ordered this encounter  Medications  . neomycin-polymyxin-hydrocortisone (CORTISPORIN) otic solution    Sig: Place 3 drops into the left ear 4 (four) times daily.    Dispense:  10 mL    Refill:  1  . doxycycline (VIBRA-TABS) 100 MG tablet    Sig: Take 1 tablet (100 mg total) by mouth 2 (two) times daily.    Dispense:  14 tablet    Refill:  0     Follow-up: No Follow-up on file.  Walker Kehr, MD

## 2015-09-24 NOTE — Patient Instructions (Signed)

## 2015-10-11 DIAGNOSIS — Z8546 Personal history of malignant neoplasm of prostate: Secondary | ICD-10-CM | POA: Diagnosis not present

## 2015-10-24 ENCOUNTER — Encounter: Payer: Medicare HMO | Admitting: Internal Medicine

## 2015-11-06 NOTE — Progress Notes (Signed)
Pre visit review using our clinic review tool, if applicable. No additional management support is needed unless otherwise documented below in the visit note. 

## 2015-11-06 NOTE — Progress Notes (Addendum)
Subjective:   Jeremy [name] is a 75 y.o. male who presents for Medicare Annual/Subsequent preventive examination.  The Patient was informed that the wellness visit is to identify future health risk and educate and initiate measures that can reduce risk for increased disease through the lifespan.   Describes health as fair, good or great? "great"  Review of Systems:  No ROS.  Medicare Wellness Visit.    Sleep patterns:Sleeps about 8 hours, up to void x 1.    Home Safety/Smoke Alarms: Smoke detector in place. Feels safe in home and neighborhood.   Living environment; residence and Firearm Safety: Lives with wife and dog in single story home. Firearms locked in safe place.  Seat Belt Safety/Bike Helmet: Wears seatbelt.    Counseling:   Eye Exam-Last exam, few months ago. Followed yearly by [facility].  Dental-Last exam 5 months ago, followed every 6 months.  Male:   CCS-Colonoscopy 07/01/2016, normal. Recall 10 years.     PSA-10/05/14, 0.010. Followed by [facility]. Pt states latest PSA (09/2015)= 0.02.     Objective:    Vitals: BP 130/72 (BP Location: Left Arm, Patient Position: Sitting, Cuff Size: Normal)   Pulse 70   Temp 97.7 F (36.5 C)   Ht 5\' 8"  (1.727 m)   Wt 184 lb 1.9 oz (83.5 kg)   SpO2 96%   BMI 28.00 kg/m   Body mass index is 28 kg/m.  Tobacco History  Smoking Status  . Current Some Day Smoker  . Types: Cigars  Smokeless Tobacco  . Never Used    Comment: 2 cigars a month     Ready to quit: Not Answered Counseling given: Not Answered   Past Medical History:  Diagnosis Date  . AK (actinic keratosis)   . Allergic rhinitis   . History of colonic polyps    Dr. Ardis Hughs- Due 2018  . History of skin cancer   . Osteopenia   . Prostate cancer ([place])    hx of s/p XRT, seeds Dr. Risa Grill   Past Surgical History:  Procedure Laterality Date  . INSERTION PROSTATE RADIATION SEED    . KNEE ARTHROSCOPY Right   . KNEE SURGERY Left   .  TONSILLECTOMY    . XRT  2008   Family History  Problem Relation Age of Onset  . Liver disease Father   . Cancer Father     pancreas ?  Marland Kitchen Cancer - Other      intestinal cancer   History  Sexual Activity  . Sexual activity: Yes    Outpatient Encounter Prescriptions as of 11/07/2015  Medication Sig  . Acetaminophen (APAP 500 PO) Take 1 tablet by mouth 2 (two) times daily. Reported on 02/26/2015  . budesonide (RHINOCORT ALLERGY) 32 MCG/ACT nasal spray Place 1 spray into both nostrils daily. Reported on 02/26/2015  . Cholecalciferol 1000 UNITS tablet Take 1,000 Units by mouth daily.    . Omega-3 Fatty Acids (FISH OIL) 1000 MG CAPS Take 2 capsules by mouth daily.   Marland Kitchen azelastine (ASTELIN) 0.1 % nasal spray Use two sprays in each nostril twice daily as needed for runny nose and sneezing. (Patient not taking: Reported on 11/07/2015)  . doxycycline (VIBRA-TABS) 100 MG tablet Take 1 tablet (100 mg total) by mouth 2 (two) times daily. (Patient not taking: Reported on 11/07/2015)  . montelukast (SINGULAIR) 10 MG tablet Take 10 mg by mouth at bedtime. Reported on 04/22/2015  . neomycin-polymyxin-hydrocortisone (CORTISPORIN) otic solution Place 3 drops into the  left ear 4 (four) times daily. (Patient not taking: Reported on 11/07/2015)   No facility-administered encounter medications on file as of 11/07/2015.     Activities of Daily Living In your present state of health, do you have any difficulty performing the following activities: 11/07/2015  Hearing? N  Vision? N  Difficulty concentrating or making decisions? N  Walking or climbing stairs? N  Dressing or bathing? N  Doing errands, shopping? N  Preparing Food and eating ? N  Using the Toilet? N  In the past six months, have you accidently leaked urine? N  Do you have problems with loss of bowel control? N  Managing your Medications? N  Managing your Finances? N  Housekeeping or managing your Housekeeping? N  Some recent data might be  hidden    Patient Care Team: Cassandria Anger, MD as PCP - General Rana Snare, MD (Urology) Macarthur Critchley, OD as Referring Physician (Optometry)   Assessment:    Physical assessment deferred to PCP.  Exercise Activities and Dietary recommendations Current Exercise Habits: Structured exercise class, Type of exercise: walking;strength training/weights (Golf twice weekly), Time (Minutes): > 60, Frequency (Times/Week): 2, Weekly Exercise (Minutes/Week): 0, Exercise limited by: orthopedic condition(s)   Diet (meal preparation, eat out, water intake, caffeinated beverages, dairy products, fruits and vegetables): Eats fruits, vegetables, fish, poultry (limited red meat), prepared by his wife. Does not eat fast food. Drinks unsweet tea, 1 cup of coffee and water throughout the day, trying to increase. Occasional sports drink. Normally skips lunch.   Encouraged patient to continue healthy eating lifestyle as well as staying active on the golf course and in the gym.   Goals      Patient Stated   . <enter goal here> (pt-stated)          Patient would like to maintain current health status.       Fall Risk Fall Risk  11/07/2015 10/22/2014  Falls in the past year? No No   Depression Screen PHQ 2/9 Scores 11/07/2015 10/22/2014  PHQ - 2 Score 0 0    Cognitive Function       Ad8 score reviewed for issues:  Issues making decisions: No.   Less interest in hobbies / activities: No.  Repeats questions, stories (family complaining): No.   Trouble using ordinary gadgets (microwave, computer, phone): No.  Forgets the month or year: No.   Mismanaging finances: No.  Remembering appts: No.  Daily problems with thinking and/or memory: No.  Ad8 score is=0     Immunization History  Administered Date(s) Administered  . Influenza Split 10/08/2011  . Influenza Whole 11/18/2006, 10/31/2007, 09/22/2010  . Influenza,inj,Quad PF,36+ Mos 10/14/2012, 10/20/2013, 10/22/2014  .  Pneumococcal Conjugate-13 04/22/2015  . Pneumococcal Polysaccharide-23 08/22/2009  . Td 09/13/2008  . Zoster 10/18/2009   Screening Tests Health Maintenance  Topic Date Due  . INFLUENZA VACCINE  08/13/2015  . COLONOSCOPY  07/01/2016  . TETANUS/TDAP  09/14/2018  . ZOSTAVAX  Completed  . PNA vac Low Risk Adult  Completed      Plan:     Continue to eat heart healthy diet (full of fruits, vegetables, whole grains, lean protein, water--limit salt, fat, and sugar intake) and increase physical activity as tolerated.  Continue doing brain stimulating activities (puzzles, reading, adult coloring books, staying active) to keep memory sharp.   Bring a copy of your advanced directives to your next office visit.  **Patient's mother recently passed this week at 39 years old.**  During the  course of the visit the patient was educated and counseled about the following appropriate screening and preventive services:   Vaccines to include Pneumoccal, Influenza, Hepatitis B, Td, Zostavax, HCV  Cardiovascular Disease  Colorectal cancer screening  Diabetes screening  Prostate Cancer Screening  Glaucoma screening  Nutrition counseling     Patient Instructions (the written plan) was given to the patient.    Gerilyn Nestle, RN  11/07/2015  Medical screening examination/treatment/procedure(s) were performed by non-physician practitioner and as supervising physician I was immediately available for consultation/collaboration. I agree with above. Walker Kehr, MD

## 2015-11-07 ENCOUNTER — Ambulatory Visit (INDEPENDENT_AMBULATORY_CARE_PROVIDER_SITE_OTHER): Payer: Medicare HMO | Admitting: Internal Medicine

## 2015-11-07 ENCOUNTER — Encounter: Payer: Self-pay | Admitting: Internal Medicine

## 2015-11-07 ENCOUNTER — Other Ambulatory Visit (INDEPENDENT_AMBULATORY_CARE_PROVIDER_SITE_OTHER): Payer: Medicare HMO

## 2015-11-07 VITALS — BP 130/72 | HR 70 | Temp 97.7°F | Ht 68.0 in | Wt 184.1 lb

## 2015-11-07 DIAGNOSIS — Z0000 Encounter for general adult medical examination without abnormal findings: Secondary | ICD-10-CM

## 2015-11-07 DIAGNOSIS — E785 Hyperlipidemia, unspecified: Secondary | ICD-10-CM

## 2015-11-07 DIAGNOSIS — Z23 Encounter for immunization: Secondary | ICD-10-CM | POA: Diagnosis not present

## 2015-11-07 DIAGNOSIS — Z8546 Personal history of malignant neoplasm of prostate: Secondary | ICD-10-CM | POA: Diagnosis not present

## 2015-11-07 LAB — URINALYSIS
Bilirubin Urine: NEGATIVE
HGB URINE DIPSTICK: NEGATIVE
Ketones, ur: NEGATIVE
Leukocytes, UA: NEGATIVE
NITRITE: NEGATIVE
Specific Gravity, Urine: 1.015 (ref 1.000–1.030)
TOTAL PROTEIN, URINE-UPE24: NEGATIVE
UROBILINOGEN UA: 0.2 (ref 0.0–1.0)
Urine Glucose: NEGATIVE
pH: 7 (ref 5.0–8.0)

## 2015-11-07 LAB — HEPATIC FUNCTION PANEL
ALBUMIN: 4 g/dL (ref 3.5–5.2)
ALT: 28 U/L (ref 0–53)
AST: 23 U/L (ref 0–37)
Alkaline Phosphatase: 62 U/L (ref 39–117)
Bilirubin, Direct: 0.2 mg/dL (ref 0.0–0.3)
Total Bilirubin: 0.7 mg/dL (ref 0.2–1.2)
Total Protein: 7.2 g/dL (ref 6.0–8.3)

## 2015-11-07 LAB — CBC WITH DIFFERENTIAL/PLATELET
BASOS PCT: 0.2 % (ref 0.0–3.0)
Basophils Absolute: 0 10*3/uL (ref 0.0–0.1)
EOS PCT: 0.9 % (ref 0.0–5.0)
Eosinophils Absolute: 0.1 10*3/uL (ref 0.0–0.7)
HEMATOCRIT: 44 % (ref 39.0–52.0)
HEMOGLOBIN: 15.3 g/dL (ref 13.0–17.0)
LYMPHS PCT: 35.9 % (ref 12.0–46.0)
Lymphs Abs: 1.9 10*3/uL (ref 0.7–4.0)
MCHC: 34.7 g/dL (ref 30.0–36.0)
MCV: 99.2 fl (ref 78.0–100.0)
MONOS PCT: 8.6 % (ref 3.0–12.0)
Monocytes Absolute: 0.5 10*3/uL (ref 0.1–1.0)
Neutro Abs: 2.9 10*3/uL (ref 1.4–7.7)
Neutrophils Relative %: 54.4 % (ref 43.0–77.0)
Platelets: 211 10*3/uL (ref 150.0–400.0)
RBC: 4.44 Mil/uL (ref 4.22–5.81)
RDW: 13.1 % (ref 11.5–15.5)
WBC: 5.4 10*3/uL (ref 4.0–10.5)

## 2015-11-07 LAB — BASIC METABOLIC PANEL
BUN: 16 mg/dL (ref 6–23)
CHLORIDE: 106 meq/L (ref 96–112)
CO2: 28 mEq/L (ref 19–32)
Calcium: 9.3 mg/dL (ref 8.4–10.5)
Creatinine, Ser: 0.81 mg/dL (ref 0.40–1.50)
GFR: 98.6 mL/min (ref >60.00)
Glucose, Bld: 106 mg/dL — ABNORMAL HIGH (ref 70–99)
POTASSIUM: 4.2 meq/L (ref 3.5–5.1)
SODIUM: 140 meq/L (ref 135–145)

## 2015-11-07 LAB — LIPID PANEL
CHOLESTEROL: 140 mg/dL (ref 0–200)
HDL: 41.6 mg/dL (ref >39.00)
LDL Cholesterol: 88 mg/dL (ref 0–99)
NonHDL: 98.43
TRIGLYCERIDES: 54 mg/dL (ref 0.0–149.0)
Total CHOL/HDL Ratio: 3
VLDL: 10.8 mg/dL (ref 0.0–40.0)

## 2015-11-07 LAB — TSH: TSH: 1.47 u[IU]/mL (ref 0.35–4.50)

## 2015-11-07 NOTE — Addendum Note (Signed)
Addended by: Cresenciano Lick on: 11/07/2015 11:53 AM   Modules accepted: Orders

## 2015-11-07 NOTE — Assessment & Plan Note (Signed)
Dr Risa Grill q 12 mo checks

## 2015-11-07 NOTE — Progress Notes (Signed)
Subjective:  Patient ID: Jeremy [name], male    DOB: [birth year]  Age: 75 y.o. 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 Mom died last week at 83 yo - grieving  Outpatient Medications Prior to Visit  Medication Sig Dispense Refill  . Acetaminophen (APAP 500 PO) Take 1 tablet by mouth 2 (two) times daily. Reported on 02/26/2015    . budesonide (RHINOCORT ALLERGY) 32 MCG/ACT nasal spray Place 1 spray into both nostrils daily. Reported on 02/26/2015    . Cholecalciferol 1000 UNITS tablet Take 1,000 Units by mouth daily.      . Omega-3 Fatty Acids (FISH OIL) 1000 MG CAPS Take 2 capsules by mouth daily.     Marland Kitchen azelastine (ASTELIN) 0.1 % nasal spray Use two sprays in each nostril twice daily as needed for runny nose and sneezing. (Patient not taking: Reported on 11/07/2015) 30 mL 11  . doxycycline (VIBRA-TABS) 100 MG tablet Take 1 tablet (100 mg total) by mouth 2 (two) times daily. (Patient not taking: Reported on 11/07/2015) 14 tablet 0  . montelukast (SINGULAIR) 10 MG tablet Take 10 mg by mouth at bedtime. Reported on 04/22/2015    . neomycin-polymyxin-hydrocortisone (CORTISPORIN) otic solution Place 3 drops into the left ear 4 (four) times daily. (Patient not taking: Reported on 11/07/2015) 10 mL 1   No facility-administered medications prior to visit.     ROS Review of Systems  Constitutional: Negative for appetite change, fatigue and unexpected weight change.  HENT: Negative for congestion, nosebleeds, sneezing, sore throat and trouble swallowing.   Eyes: Negative for itching and visual disturbance.  Respiratory: Negative for cough.   Cardiovascular: Negative for chest pain, palpitations and leg swelling.  Gastrointestinal: Negative for abdominal distention, blood in stool, diarrhea and nausea.  Genitourinary: Negative for frequency and hematuria.  Musculoskeletal: Negative for back pain, gait problem, joint swelling and neck pain.    Skin: Negative for rash.  Neurological: Negative for dizziness, tremors, speech difficulty and weakness.  Psychiatric/Behavioral: Negative for agitation, dysphoric mood and sleep disturbance. The patient is not nervous/anxious.   Rectal per Dr Risa Grill  Objective:  BP 130/72 (BP Location: Left Arm, Patient Position: Sitting, Cuff Size: Normal)   Pulse 70   Temp 97.7 F (36.5 C)   Ht 5\' 8"  (1.727 m)   Wt 184 lb 1.9 oz (83.5 kg)   SpO2 96%   BMI 28.00 kg/m   BP Readings from Last 3 Encounters:  11/07/15 130/72  09/24/15 122/78  04/22/15 130/66    Wt Readings from Last 3 Encounters:  11/07/15 184 lb 1.9 oz (83.5 kg)  09/24/15 186 lb (84.4 kg)  04/22/15 189 lb (85.7 kg)    Physical Exam  Constitutional: He is oriented to person, place, and time. He appears well-developed. No distress.  NAD  HENT:  Mouth/Throat: Oropharynx is clear and moist.  Eyes: Conjunctivae are normal. Pupils are equal, round, and reactive to light.  Neck: Normal range of motion. No JVD present. No thyromegaly present.  Cardiovascular: Normal rate, regular rhythm, normal heart sounds and intact distal pulses.  Exam reveals no gallop and no friction rub.   No murmur heard. Pulmonary/Chest: Effort normal and breath sounds normal. No respiratory distress. He has no wheezes. He has no rales. He exhibits no tenderness.  Abdominal: Soft. Bowel sounds are normal. He exhibits no distension and no mass. There is no tenderness. There is no rebound and no guarding.  Musculoskeletal: Normal  range of motion. He exhibits no edema or tenderness.  Lymphadenopathy:    He has no cervical adenopathy.  Neurological: He is alert and oriented to person, place, and time. He has normal reflexes. No cranial nerve deficit. He exhibits normal muscle tone. He displays a negative Romberg sign. Coordination and gait normal.  Skin: Skin is warm and dry. No rash noted.  Psychiatric: He has a normal mood and affect. His behavior is  normal. Judgment and thought content normal.    Lab Results  Component Value Date   WBC 5.2 10/22/2014   HGB 16.0 10/22/2014   HCT 46.8 10/22/2014   PLT 194.0 10/22/2014   GLUCOSE 98 10/22/2014   CHOL 152 10/22/2014   TRIG 56.0 10/22/2014   HDL 43.60 10/22/2014   LDLCALC 97 10/22/2014   ALT 22 10/22/2014   AST 20 10/22/2014   NA 138 10/22/2014   K 4.4 10/22/2014   CL 104 10/22/2014   CREATININE 0.80 10/22/2014   BUN 16 10/22/2014   CO2 28 10/22/2014   TSH 1.34 10/22/2014   PSA 0.01 (L) 10/20/2013   INR 1.0 ratio 03/27/2008    Dg Knee Complete 4 Views Left  Result Date: 02/03/2013 CLINICAL DATA:  One month history of progressive left knee pain. EXAM: LEFT KNEE - COMPLETE 4+ VIEW COMPARISON:  None. FINDINGS: The bones of the knee are adequately mineralized. There are mild degenerative changes present involving the medial lateral joint compartments. There is beaking of the tibial spines. There is faint chondrocalcinosis of the menisci. There is a tiny spur from the superior margin of the patella. There is no significant joint effusion. There is no evidence of an acute or healing fracture. IMPRESSION: There are mild degenerative changes involving the medial and lateral joint compartments. No acute bony abnormality is demonstrated. Electronically Signed   By: David  Martinique   On: 02/03/2013 08:48    Assessment & Plan:   Jakeryan was seen today for medicare wellness.  Diagnoses and all orders for this visit:  Medicare annual wellness visit, subsequent  Encounter for Medicare annual wellness exam   I am having Mr. Mcquillin maintain his Fish Oil, Cholecalciferol, Acetaminophen (APAP 500 PO), montelukast, budesonide, azelastine, neomycin-polymyxin-hydrocortisone, and doxycycline.  No orders of the defined types were placed in this encounter.    Follow-up: No Follow-up on file.  Walker Kehr, MD

## 2015-11-07 NOTE — Assessment & Plan Note (Addendum)
Mom died last week at 75 yo - grieving  Here for medicare wellness/physical  Diet: heart healthy  Physical activity: not sedentary - gym, golf Depression/mood screen: negative  Hearing: intact to whispered voice  Visual acuity: grossly normal, performs annual eye exam  ADLs: capable  Fall risk: none  Home safety: good  Cognitive evaluation: intact to orientation, naming, recall and repetition  EOL planning: adv directives, full code/ I agree  I have personally reviewed and have noted  1. The patient's medical and social history  2. Their use of alcohol, tobacco or illicit drugs  3. Their current medications and supplements  4. The patient's functional ability including ADL's, fall risks, home safety risks and hearing or visual impairment.  5. Diet and physical activities  6. Evidence for depression or mood disorders 7. Providers roster was reviewed   The patient is here for annual Medicare wellness examination and management of other chronic and acute problems.   The risk factors are reflected in the social history.  The roster of all physicians providing medical care to patient - is listed in the Snapshot section of the chart.  Activities of daily living:  The patient is 100% inedpendent in all ADLs: dressing, toileting, feeding as well as independent mobility  Home safety : good. Using seatbelts. There is no violence in the home.   There is no risks for hepatitis, STDs or HIV. There is no history of blood transfusion. They have no travel history to infectious disease endemic areas of the world.  The patient has  seen their dentist in the last 12 month. They have  seen their eye doctor in the last year. They deny  Any major hearing difficulty and have not had audiologic testing in the last year.  They do not  have excessive sun exposure. Discussed the need for sun protection: hats, long sleeves and use of sunscreen if there is significant sun exposure.   Diet: the importance of  a healthy diet is discussed. They do have a reasonably healthy  diet.  The patient has a fairly regular exercise program of a mixed nature: walking, yard work, etc.The benefits of regular aerobic exercise were discussed.  Depression screen: there are no signs or vegative symptoms of depression- irritability, change in appetite, anhedonia, sadness/tearfullness.  Cognitive assessment: the patient manages all their financial and personal affairs and is actively engaged. They could relate day,date,year and events; recalled 3/3 objects at 3 minutes  The following portions of the patient's history were reviewed and updated as appropriate: allergies, current medications, past family history, past medical history,  past surgical history, past social history  and problem list.  Vision, hearing, body mass index were assessed and reviewed.   During the course of the visit the patient was educated and counseled about appropriate screening and preventive services including : fall prevention , diabetes screening, nutrition counseling, colorectal cancer screening, and recommended immunizations.

## 2015-11-07 NOTE — Patient Instructions (Addendum)
Continue to eat heart healthy diet (full of fruits, vegetables, whole grains, lean protein, water--limit salt, fat, and sugar intake) and increase physical activity as tolerated.  Continue doing brain stimulating activities (puzzles, reading, adult coloring books, staying active) to keep memory sharp.   Bring a copy of your advanced directives to your next office visit  Fall Prevention in the Home  Falls can cause injuries. They can happen to people of all ages. There are many things you can do to make your home safe and to help prevent falls.  WHAT CAN I DO ON THE OUTSIDE OF MY HOME?  Regularly fix the edges of walkways and driveways and fix any cracks.  Remove anything that might make you trip as you walk through a door, such as a raised step or threshold.  Trim any bushes or trees on the path to your home.  Use bright outdoor lighting.  Clear any walking paths of anything that might make someone trip, such as rocks or tools.  Regularly check to see if handrails are loose or broken. Make sure that both sides of any steps have handrails.  Any raised decks and porches should have guardrails on the edges.  Have any leaves, snow, or ice cleared regularly.  Use sand or salt on walking paths during winter.  Clean up any spills in your garage right away. This includes oil or grease spills. WHAT CAN I DO IN THE BATHROOM?   Use night lights.  Install grab bars by the toilet and in the tub and shower. Do not use towel bars as grab bars.  Use non-skid mats or decals in the tub or shower.  If you need to sit down in the shower, use a plastic, non-slip stool.  Keep the floor dry. Clean up any water that spills on the floor as soon as it happens.  Remove soap buildup in the tub or shower regularly.  Attach bath mats securely with double-sided non-slip rug tape.  Do not have throw rugs and other things on the floor that can make you trip. WHAT CAN I DO IN THE BEDROOM?  Use night  lights.  Make sure that you have a light by your bed that is easy to reach.  Do not use any sheets or blankets that are too big for your bed. They should not hang down onto the floor.  Have a firm chair that has side arms. You can use this for support while you get dressed.  Do not have throw rugs and other things on the floor that can make you trip. WHAT CAN I DO IN THE KITCHEN?  Clean up any spills right away.  Avoid walking on wet floors.  Keep items that you use a lot in easy-to-reach places.  If you need to reach something above you, use a strong step stool that has a grab bar.  Keep electrical cords out of the way.  Do not use floor polish or wax that makes floors slippery. If you must use wax, use non-skid floor wax.  Do not have throw rugs and other things on the floor that can make you trip. WHAT CAN I DO WITH MY STAIRS?  Do not leave any items on the stairs.  Make sure that there are handrails on both sides of the stairs and use them. Fix handrails that are broken or loose. Make sure that handrails are as long as the stairways.  Check any carpeting to make sure that it is firmly attached to  the stairs. Fix any carpet that is loose or worn.  Avoid having throw rugs at the top or bottom of the stairs. If you do have throw rugs, attach them to the floor with carpet tape.  Make sure that you have a light switch at the top of the stairs and the bottom of the stairs. If you do not have them, ask someone to add them for you. WHAT ELSE CAN I DO TO HELP PREVENT FALLS?  Wear shoes that:  Do not have high heels.  Have rubber bottoms.  Are comfortable and fit you well.  Are closed at the toe. Do not wear sandals.  If you use a stepladder:  Make sure that it is fully opened. Do not climb a closed stepladder.  Make sure that both sides of the stepladder are locked into place.  Ask someone to hold it for you, if possible.  Clearly mark and make sure that you can  see:  Any grab bars or handrails.  First and last steps.  Where the edge of each step is.  Use tools that help you move around (mobility aids) if they are needed. These include:  Canes.  Walkers.  Scooters.  Crutches.  Turn on the lights when you go into a dark area. Replace any light bulbs as soon as they burn out.  Set up your furniture so you have a clear path. Avoid moving your furniture around.  If any of your floors are uneven, fix them.  If there are any pets around you, be aware of where they are.  Review your medicines with your doctor. Some medicines can make you feel dizzy. This can increase your chance of falling. Ask your doctor what other things that you can do to help prevent falls.   This information is not intended to replace advice given to you by your health care provider. Make sure you discuss any questions you have with your health care provider.   Document Released: 10/25/2008 Document Revised: 05/15/2014 Document Reviewed: 02/02/2014 Elsevier Interactive Patient Education 2016 [place], [place] A healthy lifestyle and preventative care can promote health and wellness.  Maintain regular health, dental, and eye exams.  Eat a healthy diet. Foods like vegetables, fruits, whole grains, low-fat dairy products, and lean protein foods contain the nutrients you need and are low in calories. Decrease your intake of foods high in solid fats, added sugars, and salt. Get information about a proper diet from your health care provider, if necessary.  Regular physical exercise is one of the most important things you can do for your health. Most adults should get at least 150 minutes of moderate-intensity exercise (any activity that increases your heart rate and causes you to sweat) each week. In addition, most adults need muscle-strengthening exercises on 2 or more days a week.   Maintain a healthy weight. The body mass index (BMI) is a  screening tool to identify possible weight problems. It provides an estimate of body fat based on height and weight. Your health care provider can find your BMI and can help you achieve or maintain a healthy weight. For males 20 years and older:  A BMI below 18.5 is considered underweight.  A BMI of 18.5 to 24.9 is normal.  A BMI of 25 to 29.9 is considered overweight.  A BMI of 30 and above is considered obese.  Maintain normal blood lipids and cholesterol by exercising and minimizing your intake of saturated fat. Eat a balanced  diet with plenty of fruits and vegetables. Blood tests for lipids and cholesterol should begin at age 63 and be repeated every 5 years. If your lipid or cholesterol levels are high, you are over age 60, or you are at high risk for heart disease, you may need your cholesterol levels checked more frequently.Ongoing high lipid and cholesterol levels should be treated with medicines if diet and exercise are not working.  If you smoke, find out from your health care provider how to quit. If you do not use tobacco, do not start.  Lung cancer screening is recommended for adults aged 10-80 years who are at high risk for developing lung cancer because of a history of smoking. A yearly low-dose CT scan of the lungs is recommended for people who have at least a 30-pack-year history of smoking and are current smokers or have quit within the past 15 years. A pack year of smoking is smoking an average of 1 pack of cigarettes a day for 1 year (for example, a 30-pack-year history of smoking could mean smoking 1 pack a day for 30 years or 2 packs a day for 15 years). Yearly screening should continue until the smoker has stopped smoking for at least 15 years. Yearly screening should be stopped for people who develop a health problem that would prevent them from having lung cancer treatment.  If you choose to drink alcohol, do not have more than 2 drinks per day. One drink is considered to be  12 oz (360 mL) of beer, 5 oz (150 mL) of wine, or 1.5 oz (45 mL) of liquor.  Avoid the use of street drugs. Do not share needles with anyone. Ask for help if you need support or instructions about stopping the use of drugs.  High blood pressure causes heart disease and increases the risk of stroke. High blood pressure is more likely to develop in:  People who have blood pressure in the end of the normal range (100-139/85-89 mm Hg).  People who are overweight or obese.  People who are African American.  If you are 58-58 years of age, have your blood pressure checked every 3-5 years. If you are 63 years of age or older, have your blood pressure checked every year. You should have your blood pressure measured twice--once when you are at a hospital or clinic, and once when you are not at a hospital or clinic. Record the average of the two measurements. To check your blood pressure when you are not at a hospital or clinic, you can use:  An automated blood pressure machine at a pharmacy.  A home blood pressure monitor.  If you are 36-4 years old, ask your health care provider if you should take aspirin to prevent heart disease.  Diabetes screening involves taking a blood sample to check your fasting blood sugar level. This should be done once every 3 years after age 62 if you are at a normal weight and without risk factors for diabetes. Testing should be considered at a younger age or be carried out more frequently if you are overweight and have at least 1 risk factor for diabetes.  Colorectal cancer can be detected and often prevented. Most routine colorectal cancer screening begins at the age of 43 and continues through age 70. However, your health care provider may recommend screening at an earlier age if you have risk factors for colon cancer. On a yearly basis, your health care provider may provide home test kits to check  for hidden blood in the stool. A small camera at the end of a tube may be  used to directly examine the colon (sigmoidoscopy or colonoscopy) to detect the earliest forms of colorectal cancer. Talk to your health care provider about this at age 71 when routine screening begins. A direct exam of the colon should be repeated every 5-10 years through age 76, unless early forms of precancerous polyps or small growths are found.  People who are at an increased risk for hepatitis B should be screened for this virus. You are considered at high risk for hepatitis B if:  You were born in a country where hepatitis B occurs often. Talk with your health care provider about which countries are considered high risk.  Your parents were born in a high-risk country and you have not received a shot to protect against hepatitis B (hepatitis B vaccine).  You have HIV or AIDS.  You use needles to inject street drugs.  You live with, or have sex with, someone who has hepatitis B.  You are a man who has sex with other men (MSM).  You get hemodialysis treatment.  You take certain medicines for conditions like cancer, organ transplantation, and autoimmune conditions.  Hepatitis C blood testing is recommended for all people born from [birth year] through 1965 and any individual with known risk factors for hepatitis C.  Healthy men should no longer receive prostate-specific antigen (PSA) blood tests as part of routine cancer screening. Talk to your health care provider about prostate cancer screening.  Testicular cancer screening is not recommended for adolescents or adult males who have no symptoms. Screening includes self-exam, a health care provider exam, and other screening tests. Consult with your health care provider about any symptoms you have or any concerns you have about testicular cancer.  Practice safe sex. Use condoms and avoid high-risk sexual practices to reduce the spread of sexually transmitted infections (STIs).  You should be screened for STIs, including gonorrhea and chlamydia  if:  You are sexually active and are younger than 24 years.  You are older than 24 years, and your health care provider tells you that you are at risk for this type of infection.  Your sexual activity has changed since you were last screened, and you are at an increased risk for chlamydia or gonorrhea. Ask your health care provider if you are at risk.  If you are at risk of being infected with HIV, it is recommended that you take a prescription medicine daily to prevent HIV infection. This is called pre-exposure prophylaxis (PrEP). You are considered at risk if:  You are a man who has sex with other men (MSM).  You are a heterosexual man who is sexually active with multiple partners.  You take drugs by injection.  You are sexually active with a partner who has HIV.  Talk with your health care provider about whether you are at high risk of being infected with HIV. If you choose to begin PrEP, you should first be tested for HIV. You should then be tested every 3 months for as long as you are taking PrEP.  Use sunscreen. Apply sunscreen liberally and repeatedly throughout the day. You should seek shade when your shadow is shorter than you. Protect yourself by wearing long sleeves, pants, a wide-brimmed hat, and sunglasses year round whenever you are outdoors.  Tell your health care provider of new moles or changes in moles, especially if there is a change in shape or color.  Also, tell your health care provider if a mole is larger than the size of a pencil eraser.  A one-time screening for abdominal aortic aneurysm (AAA) and surgical repair of large AAAs by ultrasound is recommended for men aged 4-75 years who are current or former smokers.  Stay current with your vaccines (immunizations).   This information is not intended to replace advice given to you by your health care provider. Make sure you discuss any questions you have with your health care provider.   Document Released:  06/27/2007 Document Revised: 01/19/2014 Document Reviewed: 05/26/2010 Elsevier Interactive Patient Education 2016 [place] for Adults, Male A healthy lifestyle and preventive care can promote health and wellness. Preventive health guidelines for men include the following key practices:  A routine yearly physical is a good way to check with your health care provider about your health and preventative screening. It is a chance to share any concerns and updates on your health and to receive a thorough exam.  Visit your dentist for a routine exam and preventative care every 6 months. Brush your teeth twice a day and floss once a day. Good oral hygiene prevents tooth decay and gum disease.  The frequency of eye exams is based on your age, health, family medical history, use of contact lenses, and other factors. Follow your health care provider's recommendations for frequency of eye exams.  Eat a healthy diet. Foods such as vegetables, fruits, whole grains, low-fat dairy products, and lean protein foods contain the nutrients you need without too many calories. Decrease your intake of foods high in solid fats, added sugars, and salt. Eat the right amount of calories for you.Get information about a proper diet from your health care provider, if necessary.  Regular physical exercise is one of the most important things you can do for your health. Most adults should get at least 150 minutes of moderate-intensity exercise (any activity that increases your heart rate and causes you to sweat) each week. In addition, most adults need muscle-strengthening exercises on 2 or more days a week.  Maintain a healthy weight. The body mass index (BMI) is a screening tool to identify possible weight problems. It provides an estimate of body fat based on height and weight. Your health care provider can find your BMI and can help you achieve or maintain a healthy weight.For adults 20 years and  older:  A BMI below 18.5 is considered underweight.  A BMI of 18.5 to 24.9 is normal.  A BMI of 25 to 29.9 is considered overweight.  A BMI of 30 and above is considered obese.  Maintain normal blood lipids and cholesterol levels by exercising and minimizing your intake of saturated fat. Eat a balanced diet with plenty of fruit and vegetables. Blood tests for lipids and cholesterol should begin at age 64 and be repeated every 5 years. If your lipid or cholesterol levels are high, you are over 50, or you are at high risk for heart disease, you may need your cholesterol levels checked more frequently.Ongoing high lipid and cholesterol levels should be treated with medicines if diet and exercise are not working.  If you smoke, find out from your health care provider how to quit. If you do not use tobacco, do not start.  Lung cancer screening is recommended for adults aged 8-80 years who are at high risk for developing lung cancer because of a history of smoking. A yearly low-dose CT scan of the lungs is recommended for people  who have at least a 30-pack-year history of smoking and are a current smoker or have quit within the past 15 years. A pack year of smoking is smoking an average of 1 pack of cigarettes a day for 1 year (for example: 1 pack a day for 30 years or 2 packs a day for 15 years). Yearly screening should continue until the smoker has stopped smoking for at least 15 years. Yearly screening should be stopped for people who develop a health problem that would prevent them from having lung cancer treatment.  If you choose to drink alcohol, do not have more than 2 drinks per day. One drink is considered to be 12 ounces (355 mL) of beer, 5 ounces (148 mL) of wine, or 1.5 ounces (44 mL) of liquor.  Avoid use of street drugs. Do not share needles with anyone. Ask for help if you need support or instructions about stopping the use of drugs.  High blood pressure causes heart disease and  increases the risk of stroke. Your blood pressure should be checked at least every 1-2 years. Ongoing high blood pressure should be treated with medicines, if weight loss and exercise are not effective.  If you are 64-77 years old, ask your health care provider if you should take aspirin to prevent heart disease.  Diabetes screening is done by taking a blood sample to check your blood glucose level after you have not eaten for a certain period of time (fasting). If you are not overweight and you do not have risk factors for diabetes, you should be screened once every 3 years starting at age 27. If you are overweight or obese and you are 52-10 years of age, you should be screened for diabetes every year as part of your cardiovascular risk assessment.  Colorectal cancer can be detected and often prevented. Most routine colorectal cancer screening begins at the age of 19 and continues through age 60. However, your health care provider may recommend screening at an earlier age if you have risk factors for colon cancer. On a yearly basis, your health care provider may provide home test kits to check for hidden blood in the stool. Use of a small camera at the end of a tube to directly examine the colon (sigmoidoscopy or colonoscopy) can detect the earliest forms of colorectal cancer. Talk to your health care provider about this at age 57, when routine screening begins. Direct exam of the colon should be repeated every 5-10 years through age 34, unless early forms of precancerous polyps or small growths are found.  People who are at an increased risk for hepatitis B should be screened for this virus. You are considered at high risk for hepatitis B if:  You were born in a country where hepatitis B occurs often. Talk with your health care provider about which countries are considered high risk.  Your parents were born in a high-risk country and you have not received a shot to protect against hepatitis B  (hepatitis B vaccine).  You have HIV or AIDS.  You use needles to inject street drugs.  You live with, or have sex with, someone who has hepatitis B.  You are a man who has sex with other men (MSM).  You get hemodialysis treatment.  You take certain medicines for conditions such as cancer, organ transplantation, and autoimmune conditions.  Hepatitis C blood testing is recommended for all people born from [birth year] through 1965 and any individual with known risks for hepatitis C.  Practice safe sex. Use condoms and avoid high-risk sexual practices to reduce the spread of sexually transmitted infections (STIs). STIs include gonorrhea, chlamydia, syphilis, trichomonas, herpes, HPV, and human immunodeficiency virus (HIV). Herpes, HIV, and HPV are viral illnesses that have no cure. They can result in disability, cancer, and death.  If you are a man who has sex with other men, you should be screened at least once per year for:  HIV.  Urethral, rectal, and pharyngeal infection of gonorrhea, chlamydia, or both.  If you are at risk of being infected with HIV, it is recommended that you take a prescription medicine daily to prevent HIV infection. This is called preexposure prophylaxis (PrEP). You are considered at risk if:  You are a man who has sex with other men (MSM) and have other risk factors.  You are a heterosexual man, are sexually active, and are at increased risk for HIV infection.  You take drugs by injection.  You are sexually active with a partner who has HIV.  Talk with your health care provider about whether you are at high risk of being infected with HIV. If you choose to begin PrEP, you should first be tested for HIV. You should then be tested every 3 months for as long as you are taking PrEP.  A one-time screening for abdominal aortic aneurysm (AAA) and surgical repair of large AAAs by ultrasound are recommended for men ages 86 to 56 years who are current or former  smokers.  Healthy men should no longer receive prostate-specific antigen (PSA) blood tests as part of routine cancer screening. Talk with your health care provider about prostate cancer screening.  Testicular cancer screening is not recommended for adult males who have no symptoms. Screening includes self-exam, a health care provider exam, and other screening tests. Consult with your health care provider about any symptoms you have or any concerns you have about testicular cancer.  Use sunscreen. Apply sunscreen liberally and repeatedly throughout the day. You should seek shade when your shadow is shorter than you. Protect yourself by wearing long sleeves, pants, a wide-brimmed hat, and sunglasses year round, whenever you are outdoors.  Once a month, do a whole-body skin exam, using a mirror to look at the skin on your back. Tell your health care provider about new moles, moles that have irregular borders, moles that are larger than a pencil eraser, or moles that have changed in shape or color.  Stay current with required vaccines (immunizations).  Influenza vaccine. All adults should be immunized every year.  Tetanus, diphtheria, and acellular pertussis (Td, Tdap) vaccine. An adult who has not previously received Tdap or who does not know his vaccine status should receive 1 dose of Tdap. This initial dose should be followed by tetanus and diphtheria toxoids (Td) booster doses every 10 years. Adults with an unknown or incomplete history of completing a 3-dose immunization series with Td-containing vaccines should begin or complete a primary immunization series including a Tdap dose. Adults should receive a Td booster every 10 years.  Varicella vaccine. An adult without evidence of immunity to varicella should receive 2 doses or a second dose if he has previously received 1 dose.  Human papillomavirus (HPV) vaccine. Males aged 11-21 years who have not received the vaccine previously should receive  the 3-dose series. Males aged 22-26 years may be immunized. Immunization is recommended through the age of 29 years for any male who has sex with males and did not get any or all doses  earlier. Immunization is recommended for any person with an immunocompromised condition through the age of 86 years if he did not get any or all doses earlier. During the 3-dose series, the second dose should be obtained 4-8 weeks after the first dose. The third dose should be obtained 24 weeks after the first dose and 16 weeks after the second dose.  Zoster vaccine. One dose is recommended for adults aged 59 years or older unless certain conditions are present.  Measles, mumps, and rubella (MMR) vaccine. Adults born before [birth year] generally are considered immune to measles and mumps. Adults born in [birth year] or later should have 1 or more doses of MMR vaccine unless there is a contraindication to the vaccine or there is laboratory evidence of immunity to each of the three diseases. A routine second dose of MMR vaccine should be obtained at least 28 days after the first dose for students attending postsecondary schools, health care workers, or international travelers. People who received inactivated measles vaccine or an unknown type of measles vaccine during 1963-1967 should receive 2 doses of MMR vaccine. People who received inactivated mumps vaccine or an unknown type of mumps vaccine before 1979 and are at high risk for mumps infection should consider immunization with 2 doses of MMR vaccine. Unvaccinated health care workers born before [birth year] who lack laboratory evidence of measles, mumps, or rubella immunity or laboratory confirmation of disease should consider measles and mumps immunization with 2 doses of MMR vaccine or rubella immunization with 1 dose of MMR vaccine.  Pneumococcal 13-valent conjugate (PCV13) vaccine. When indicated, a person who is uncertain of his immunization history and has no record of immunization should  receive the PCV13 vaccine. All adults 83 years of age and older should receive this vaccine. An adult aged 52 years or older who has certain medical conditions and has not been previously immunized should receive 1 dose of PCV13 vaccine. This PCV13 should be followed with a dose of pneumococcal polysaccharide (PPSV23) vaccine. Adults who are at high risk for pneumococcal disease should obtain the PPSV23 vaccine at least 8 weeks after the dose of PCV13 vaccine. Adults older than 75 years of age who have normal immune system function should obtain the PPSV23 vaccine dose at least 1 year after the dose of PCV13 vaccine.  Pneumococcal polysaccharide (PPSV23) vaccine. When PCV13 is also indicated, PCV13 should be obtained first. All adults aged 88 years and older should be immunized. An adult younger than age 3 years who has certain medical conditions should be immunized. Any person who resides in a nursing home or long-term care facility should be immunized. An adult smoker should be immunized. People with an immunocompromised condition and certain other conditions should receive both PCV13 and PPSV23 vaccines. People with human immunodeficiency virus (HIV) infection should be immunized as soon as possible after diagnosis. Immunization during chemotherapy or radiation therapy should be avoided. Routine use of PPSV23 vaccine is not recommended for American Indians, [place] Natives, or people younger than 65 years unless there are medical conditions that require PPSV23 vaccine. When indicated, people who have unknown immunization and have no record of immunization should receive PPSV23 vaccine. One-time revaccination 5 years after the first dose of PPSV23 is recommended for people aged 19-64 years who have chronic kidney failure, nephrotic syndrome, asplenia, or immunocompromised conditions. People who received 1-2 doses of PPSV23 before age 32 years should receive another dose of PPSV23 vaccine at age 91 years or later  if at least 5 years have  passed since the previous dose. Doses of PPSV23 are not needed for people immunized with PPSV23 at or after age 42 years.  Meningococcal vaccine. Adults with asplenia or persistent complement component deficiencies should receive 2 doses of quadrivalent meningococcal conjugate (MenACWY-D) vaccine. The doses should be obtained at least 2 months apart. Microbiologists working with certain meningococcal bacteria, [place] recruits, people at risk during an outbreak, and people who travel to or live in countries with a high rate of meningitis should be immunized. A first-year college student up through age 67 years who is living in a residence hall should receive a dose if he did not receive a dose on or after his 16th birthday. Adults who have certain high-risk conditions should receive one or more doses of vaccine.  Hepatitis A vaccine. Adults who wish to be protected from this disease, have chronic liver disease, work with hepatitis A-infected animals, work in hepatitis A research labs, or travel to or work in countries with a high rate of hepatitis A should be immunized. Adults who were previously unvaccinated and who anticipate close contact with an international adoptee during the first 60 days after arrival in the [place] from a country with a high rate of hepatitis A should be immunized.  Hepatitis B vaccine. Adults should be immunized if they wish to be protected from this disease, are under age 41 years and have diabetes, have chronic liver disease, have had more than one sex partner in the past 6 months, may be exposed to blood or other infectious body fluids, are household contacts or sex partners of hepatitis B positive people, are clients or workers in certain care facilities, or travel to or work in countries with a high rate of hepatitis B.  Haemophilus influenzae type b (Hib) vaccine. A previously unvaccinated person with asplenia or sickle cell disease or having a  scheduled splenectomy should receive 1 dose of Hib vaccine. Regardless of previous immunization, a recipient of a hematopoietic stem cell transplant should receive a 3-dose series 6-12 months after his successful transplant. Hib vaccine is not recommended for adults with HIV infection. Preventive Service / Frequency Ages 66 to 19  Blood pressure check.** / Every 3-5 years.  Lipid and cholesterol check.** / Every 5 years beginning at age 19.  Hepatitis C blood test.** / For any individual with known risks for hepatitis C.  Skin self-exam. / Monthly.  Influenza vaccine. / Every year.  Tetanus, diphtheria, and acellular pertussis (Tdap, Td) vaccine.** / Consult your health care provider. 1 dose of Td every 10 years.  Varicella vaccine.** / Consult your health care provider.  HPV vaccine. / 3 doses over 6 months, if 70 or younger.  Measles, mumps, rubella (MMR) vaccine.** / You need at least 1 dose of MMR if you were born in 1957 or later. You may also need a second dose.  Pneumococcal 13-valent conjugate (PCV13) vaccine.** / Consult your health care provider.  Pneumococcal polysaccharide (PPSV23) vaccine.** / 1 to 2 doses if you smoke cigarettes or if you have certain conditions.  Meningococcal vaccine.** / 1 dose if you are age 39 to 16 years and a Market researcher living in a residence hall, or have one of several medical conditions. You may also need additional booster doses.  Hepatitis A vaccine.** / Consult your health care provider.  Hepatitis B vaccine.** / Consult your health care provider.  Haemophilus influenzae type b (Hib) vaccine.** / Consult your health care provider. Ages 57 to 75  Blood  pressure check.** / Every year.  Lipid and cholesterol check.** / Every 5 years beginning at age 29.  Lung cancer screening. / Every year if you are aged 66-80 years and have a 30-pack-year history of smoking and currently smoke or have quit within the past 15 years.  Yearly screening is stopped once you have quit smoking for at least 15 years or develop a health problem that would prevent you from having lung cancer treatment.  Fecal occult blood test (FOBT) of stool. / Every year beginning at age 66 and continuing until age 63. You may not have to do this test if you get a colonoscopy every 10 years.  Flexible sigmoidoscopy** or colonoscopy.** / Every 5 years for a flexible sigmoidoscopy or every 10 years for a colonoscopy beginning at age 32 and continuing until age 69.  Hepatitis C blood test.** / For all people born from [birth year] through 1965 and any individual with known risks for hepatitis C.  Skin self-exam. / Monthly.  Influenza vaccine. / Every year.  Tetanus, diphtheria, and acellular pertussis (Tdap/Td) vaccine.** / Consult your health care provider. 1 dose of Td every 10 years.  Varicella vaccine.** / Consult your health care provider.  Zoster vaccine.** / 1 dose for adults aged 71 years or older.  Measles, mumps, rubella (MMR) vaccine.** / You need at least 1 dose of MMR if you were born in 1957 or later. You may also need a second dose.  Pneumococcal 13-valent conjugate (PCV13) vaccine.** / Consult your health care provider.  Pneumococcal polysaccharide (PPSV23) vaccine.** / 1 to 2 doses if you smoke cigarettes or if you have certain conditions.  Meningococcal vaccine.** / Consult your health care provider.  Hepatitis A vaccine.** / Consult your health care provider.  Hepatitis B vaccine.** / Consult your health care provider.  Haemophilus influenzae type b (Hib) vaccine.** / Consult your health care provider. Ages 11 and over  Blood pressure check.** / Every year.  Lipid and cholesterol check.**/ Every 5 years beginning at age 71.  Lung cancer screening. / Every year if you are aged 59-80 years and have a 30-pack-year history of smoking and currently smoke or have quit within the past 15 years. Yearly screening is stopped once you  have quit smoking for at least 15 years or develop a health problem that would prevent you from having lung cancer treatment.  Fecal occult blood test (FOBT) of stool. / Every year beginning at age 31 and continuing until age 10. You may not have to do this test if you get a colonoscopy every 10 years.  Flexible sigmoidoscopy** or colonoscopy.** / Every 5 years for a flexible sigmoidoscopy or every 10 years for a colonoscopy beginning at age 44 and continuing until age 24.  Hepatitis C blood test.** / For all people born from [birth year] through 1965 and any individual with known risks for hepatitis C.  Abdominal aortic aneurysm (AAA) screening.** / A one-time screening for ages 46 to 61 years who are current or former smokers.  Skin self-exam. / Monthly.  Influenza vaccine. / Every year.  Tetanus, diphtheria, and acellular pertussis (Tdap/Td) vaccine.** / 1 dose of Td every 10 years.  Varicella vaccine.** / Consult your health care provider.  Zoster vaccine.** / 1 dose for adults aged 13 years or older.  Pneumococcal 13-valent conjugate (PCV13) vaccine.** / 1 dose for all adults aged 56 years and older.  Pneumococcal polysaccharide (PPSV23) vaccine.** / 1 dose for all adults aged 93 years and older.  Meningococcal vaccine.** / Consult your health care provider.  Hepatitis A vaccine.** / Consult your health care provider.  Hepatitis B vaccine.** / Consult your health care provider.  Haemophilus influenzae type b (Hib) vaccine.** / Consult your health care provider. **Family history and personal history of risk and conditions may change your health care provider's recommendations.   This information is not intended to replace advice given to you by your health care provider. Make sure you discuss any questions you have with your health care provider.   Document Released: 02/24/2001 Document Revised: 01/19/2014 Document Reviewed: 05/26/2010 Elsevier Interactive Patient Education [employer]

## 2015-11-07 NOTE — Assessment & Plan Note (Signed)
  On diet  

## 2015-11-21 DIAGNOSIS — L821 Other seborrheic keratosis: Secondary | ICD-10-CM | POA: Diagnosis not present

## 2015-11-21 DIAGNOSIS — Z85828 Personal history of other malignant neoplasm of skin: Secondary | ICD-10-CM | POA: Diagnosis not present

## 2015-11-21 DIAGNOSIS — L814 Other melanin hyperpigmentation: Secondary | ICD-10-CM | POA: Diagnosis not present

## 2015-11-21 DIAGNOSIS — C44311 Basal cell carcinoma of skin of nose: Secondary | ICD-10-CM | POA: Diagnosis not present

## 2015-11-21 DIAGNOSIS — L570 Actinic keratosis: Secondary | ICD-10-CM | POA: Diagnosis not present

## 2015-11-21 DIAGNOSIS — D1801 Hemangioma of skin and subcutaneous tissue: Secondary | ICD-10-CM | POA: Diagnosis not present

## 2015-11-21 DIAGNOSIS — D485 Neoplasm of uncertain behavior of skin: Secondary | ICD-10-CM | POA: Diagnosis not present

## 2016-02-06 DIAGNOSIS — C44311 Basal cell carcinoma of skin of nose: Secondary | ICD-10-CM | POA: Diagnosis not present

## 2016-06-05 ENCOUNTER — Encounter: Payer: Self-pay | Admitting: Gastroenterology

## 2016-06-18 DIAGNOSIS — R69 Illness, unspecified: Secondary | ICD-10-CM | POA: Diagnosis not present

## 2016-07-27 ENCOUNTER — Telehealth: Payer: Self-pay | Admitting: Internal Medicine

## 2016-07-27 NOTE — Telephone Encounter (Signed)
Called patient and offered an appoint with Dr. Paulla Fore. Scheduled with Dr. Paulla Fore tomorrow afternoon.

## 2016-07-27 NOTE — Telephone Encounter (Signed)
Patient's L knee is really giving him a problem. Dr. Tamala Julian did not have anything until August 1. Patient states he would like to be seen sooner or see another doctor. Please advise. Thank you.

## 2016-07-28 ENCOUNTER — Encounter: Payer: Self-pay | Admitting: Sports Medicine

## 2016-07-28 ENCOUNTER — Ambulatory Visit (INDEPENDENT_AMBULATORY_CARE_PROVIDER_SITE_OTHER): Payer: Medicare HMO | Admitting: Sports Medicine

## 2016-07-28 VITALS — BP 140/80 | HR 78 | Ht 68.0 in | Wt 184.2 lb

## 2016-07-28 DIAGNOSIS — M1712 Unilateral primary osteoarthritis, left knee: Secondary | ICD-10-CM

## 2016-07-28 MED ORDER — DICLOFENAC SODIUM 1 % TD GEL: TRANSDERMAL | 3 refills | Status: DC

## 2016-07-28 NOTE — Patient Instructions (Addendum)
You can use the compression sleeve at any time throughout the day but is most important to use while being active as well as for 2 hours post-activity.   It is appropriate to ice following activity with the compression sleeve in place.   

## 2016-07-28 NOTE — Assessment & Plan Note (Signed)
Symptomatic Baker's cyst that has subsequently completely resolved on its own.  We have have him continue with compression, icing and Voltaren gel.  Continue with activities as long as they are not exacerbating and follow-up as needed for repeat injections and/or aspirations if needed.  Body Helix compression sleeve provided today.

## 2016-07-28 NOTE — Progress Notes (Signed)
OFFICE VISIT NOTE Jeremy [name]. Jeremy [name], Jeremy [name] at [facility] [phone]  Jeremy [name] - 76 y.o. male MRN [id number]  Date of birth: 08-05-1940  Visit Date: 07/28/2016  PCP: Cassandria Anger, MD   Referred by: Cassandria Anger, MD  Jari Sportsman, cma acting as scribe for Dr. Paulla Fore.  SUBJECTIVE:   Chief Complaint  Patient presents with  . New Patient (Initial Visit)  . Left Knee Pain   HPI: As below and per problem based documentation when appropriate.   Jeremy [name] reports recurrent Posterior Left Knee Pain. This was triggered Friday morning when he woke up. Pain is gradually getting better but wanted to discuss today. Not able to completely straighten or bend leg. He saw Dr. Tamala Julian x2 years ago and responded well to an injection and orthotics. Currently wearing orthotics today.  He takes 2 tylenol for pain only if he goes to the gym or works out. No radiation of pain. Sx have affected his gait minimally. No recent falls. Swelling has resolved for the most part. No warm to touch or redness. There is some sensitvity to touch.     ROS  Otherwise per HPI.  HISTORY & PERTINENT PRIOR DATA:  No specialty comments available. He reports that he has been smoking Cigars.  He has never used smokeless tobacco. No results for input(s): HGBA1C, LABURIC in the last 8760 hours. Medications & Allergies reviewed per EMR Patient Active Problem List   Diagnosis Date Noted  . Otitis, externa, infective 09/24/2015  . Cerumen impaction 10/22/2014  . Greater trochanteric bursitis of right hip 10/01/2014  . Allergic rhinitis due to pollen 09/21/2014  . Elevated BP 02/19/2014  . Plantar fasciitis 09/07/2013  . Primary localized osteoarthrosis, lower leg 03/15/2013  . Left knee pain 02/03/2013  . Osteoarthritis of left knee 02/03/2013  . Rash and nonspecific skin eruption 06/27/2012  . Dyslipidemia 10/08/2011  . Well adult exam 09/22/2010  . Hip  pain, left 09/22/2010  . RHINITIS 01/20/2010  . Acute sinusitis 01/08/2010  . CAROTID BRUIT 08/22/2009  . LYMPHADENITIS 03/20/2009  . SKIN CANCER, HX OF 05/04/2008  . HEMATURIA 03/27/2008  . VOMITING 10/31/2007  . ABDOMINAL PAIN, RIGHT LOWER QUADRANT 10/31/2007  . COLONIC POLYPS, HX OF 05/02/2007  . Allergic rhinitis 11/18/2006  . OSTEOPENIA 11/18/2006  . PROSTATE CANCER, HX OF 10/14/2006   Past Medical History:  Diagnosis Date  . AK (actinic keratosis)   . Allergic rhinitis   . History of colonic polyps    Dr. Ardis Hughs- Due 2018  . History of skin cancer   . Osteopenia   . Prostate cancer ([place])    hx of s/p XRT, seeds Dr. Risa Grill   Family History  Problem Relation Age of Onset  . Liver disease Father   . Cancer Father        pancreas ?  Marland Kitchen Cancer - Other Unknown        intestinal cancer   Past Surgical History:  Procedure Laterality Date  . INSERTION PROSTATE RADIATION SEED    . KNEE ARTHROSCOPY Right   . KNEE SURGERY Left   . TONSILLECTOMY    . XRT  2008   Social History   Occupational History  . retired from [employer]    Social History Main Topics  . Smoking status: Current Some Day Smoker    Types: Cigars  . Smokeless tobacco: Never Used     Comment: 2 cigars a month  . Alcohol  use 1.2 oz/week    2 Cans of beer per week  . Drug use: No  . Sexual activity: Yes    OBJECTIVE:  VS:  HT:5\' 8"  (172.7 cm)   WT:184 lb 3.2 oz (83.6 kg)  BMI:28.1    BP:140/80  HR:78bpm  TEMP: ( )  RESP:99 % EXAM: Findings:  Adult male.  No acute distress.  Alert and appropriate.  His left knee is overall well aligned.  His only minimal effusion and small palpable Baker's cyst that this is mild.  He is ligamentously stable with a small amount of patellar grind.  Generalized synovitis is appreciated.  Underlying known OA.     No results found. ASSESSMENT & PLAN:     ICD-10-CM   1. Primary osteoarthritis of left knee M17.12     ================================================================= Osteoarthritis of left knee Symptomatic Baker's cyst that has subsequently completely resolved on its own.  We have have him continue with compression, icing and Voltaren gel.  Continue with activities as long as they are not exacerbating and follow-up as needed for repeat injections and/or aspirations if needed.  Body Helix compression sleeve provided today. ================================================================= Follow-up: Return if symptoms worsen or fail to improve.   CMA/ATC served as Education administrator during this visit. History, Physical, and Plan performed by medical provider. Documentation and orders reviewed and attested to.      Teresa Coombs, [place] [facility] Physician

## 2016-08-21 ENCOUNTER — Ambulatory Visit (INDEPENDENT_AMBULATORY_CARE_PROVIDER_SITE_OTHER): Payer: Medicare HMO | Admitting: Gastroenterology

## 2016-08-21 ENCOUNTER — Encounter: Payer: Self-pay | Admitting: Gastroenterology

## 2016-08-21 VITALS — BP 144/66 | HR 80 | Ht 66.5 in | Wt 184.1 lb

## 2016-08-21 DIAGNOSIS — Z1211 Encounter for screening for malignant neoplasm of colon: Secondary | ICD-10-CM

## 2016-08-21 MED ORDER — NA SULFATE-K SULFATE-MG SULF 17.5-3.13-1.6 GM/177ML PO SOLN
1.0000 | Freq: Once | ORAL | 0 refills | Status: AC
Start: 2016-08-21 — End: 2016-08-21

## 2016-08-21 NOTE — Progress Notes (Signed)
Review of pertinent gastrointestinal problems: 1. Routine risk of colon cancer; colonoscopy 2008 Dr. Ardis Hughs found diverticulosis, no polyps.  Colonoscopy 2003 Dr. Velora Heckler 3 subCM polyps removed, not retrieved.   HPI: This is a  very pleasant 76 year old man whom I last saw about 10 years ago the time of a routine screening colonoscopy  Chief complaint is routine risk for colon cancer  He is in generally very good health. He is not on any significant medications. He exercises at the gym twice a week and golfs twice a week.  He has had no issues with his bowels, no overt bleeding, no significant constipation or diarrhea.  Both of his parents lived into their 90s  No colon cancer in his family   Review of systems: Pertinent positive and negative review of systems were noted in the above HPI section. All other review negative.   Past Medical History:  Diagnosis Date  . AK (actinic keratosis)   . Allergic rhinitis   . History of colonic polyps    Dr. Ardis Hughs- Due 2018  . History of skin cancer   . Osteopenia   . Prostate cancer ([place])    hx of s/p XRT, seeds Dr. Risa Grill    Past Surgical History:  Procedure Laterality Date  . INSERTION PROSTATE RADIATION SEED    . KNEE ARTHROSCOPY Right   . KNEE SURGERY Left   . TONSILLECTOMY    . XRT  2008    Current Outpatient Prescriptions  Medication Sig Dispense Refill  . Acetaminophen (APAP 500 PO) Take 1 tablet by mouth 2 (two) times daily. Reported on 02/26/2015    . azelastine (ASTELIN) 0.1 % nasal spray Use two sprays in each nostril twice daily as needed for runny nose and sneezing. 30 mL 11  . budesonide (RHINOCORT ALLERGY) 32 MCG/ACT nasal spray Place 1 spray into both nostrils as needed. Reported on 02/26/2015    . Cholecalciferol 1000 UNITS tablet Take 1,000 Units by mouth daily.      . montelukast (SINGULAIR) 10 MG tablet Take 10 mg by mouth as needed. Reported on 04/22/2015    . Omega-3 Fatty Acids (FISH OIL) 1000 MG CAPS Take 2  capsules by mouth daily.      No current facility-administered medications for this visit.     Allergies as of 08/21/2016 - Review Complete 08/21/2016  Allergen Reaction Noted  . Aspirin      Family History  Problem Relation Age of Onset  . Liver disease Father   . Cancer Father        pancreas ?  Marland Kitchen Cancer - Other Unknown        intestinal cancer    Social History   Social History  . Marital status: Married    Spouse name: N/A  . Number of children: 2  . Years of education: N/A   Occupational History  . retired from [employer]    Social History Main Topics  . Smoking status: Current Some Day Smoker    Types: Cigars  . Smokeless tobacco: Never Used     Comment: 2 cigars a month  . Alcohol use 1.2 oz/week    2 Cans of beer per week  . Drug use: No  . Sexual activity: Yes   Other Topics Concern  . Not on file   Social History Narrative  . No narrative on file     Physical Exam: BP (!) 144/66   Pulse 80   Ht 5' 6.5" (1.689 m)  Wt 184 lb 2 oz (83.5 kg)   BMI 29.27 kg/m  Constitutional: generally well-appearing Psychiatric: alert and oriented x3 Eyes: extraocular movements intact Mouth: oral pharynx moist, no lesions Neck: supple no lymphadenopathy Cardiovascular: heart regular rate and rhythm Lungs: clear to auscultation bilaterally Abdomen: soft, nontender, nondistended, no obvious ascites, no peritoneal signs, normal bowel sounds Extremities: no lower extremity edema bilaterally Skin: no lesions on visible extremities   Assessment and plan: 76 y.o. male with  Routine risk for colon cancer he is in generally very good health. We discussed guideline screening recommendations and how they generally recommend colon cancer cream screening stopped sometime between the age of 91 and 12. After meeting him it is clear that he is in very good health and colon cancer screening is still a clinically relevant issue for him and I recommended we go ahead with that for  him at this point. We will arrange for colonoscopy to be done at his soonest convenience. I see no reason for any further blood tests or imaging studies prior to then.   Please see the "Patient Instructions" section for addition details about the plan.   Owens Loffler, MD [facility] 08/21/2016, 1:48 PM  Cc: Cassandria Anger, MD

## 2016-08-21 NOTE — Patient Instructions (Addendum)
You will be set up for a colonoscopy for screening.  Normal BMI (Body Mass Index- based on height and weight) is between 23 and 30. Your BMI today is Body mass index is 29.27 kg/m. Marland Kitchen Please consider follow up  regarding your BMI with your Primary Care Provider.

## 2016-09-15 DIAGNOSIS — Z8546 Personal history of malignant neoplasm of prostate: Secondary | ICD-10-CM | POA: Diagnosis not present

## 2016-09-21 DIAGNOSIS — Z8546 Personal history of malignant neoplasm of prostate: Secondary | ICD-10-CM | POA: Diagnosis not present

## 2016-10-06 ENCOUNTER — Encounter: Payer: Self-pay | Admitting: Gastroenterology

## 2016-10-12 ENCOUNTER — Encounter: Payer: Self-pay | Admitting: Gastroenterology

## 2016-10-12 ENCOUNTER — Ambulatory Visit (AMBULATORY_SURGERY_CENTER): Payer: Medicare HMO | Admitting: Gastroenterology

## 2016-10-12 VITALS — BP 124/76 | HR 62 | Temp 96.9°F | Resp 17 | Ht 66.5 in | Wt 184.0 lb

## 2016-10-12 DIAGNOSIS — K5730 Diverticulosis of large intestine without perforation or abscess without bleeding: Secondary | ICD-10-CM | POA: Diagnosis not present

## 2016-10-12 DIAGNOSIS — E669 Obesity, unspecified: Secondary | ICD-10-CM | POA: Diagnosis not present

## 2016-10-12 DIAGNOSIS — Z1211 Encounter for screening for malignant neoplasm of colon: Secondary | ICD-10-CM

## 2016-10-12 MED ORDER — SODIUM CHLORIDE 0.9 % IV SOLN: 500.0000 mL | INTRAVENOUS | Status: DC

## 2016-10-12 NOTE — Progress Notes (Signed)
Report to PACU, RN, vss, BBS= Clear.  

## 2016-10-12 NOTE — Op Note (Signed)
[facility] Patient Name: Jeremy [name] Procedure Date: 10/12/2016 1:28 PM MRN: [id number] Endoscopist: Milus Banister , MD Age: 76 Referring MD:  Date of Birth: 1940-07-02 Gender: Male Account #: [id number] Procedure:                Colonoscopy Indications:              Screening for colorectal malignant neoplasm Medicines:                Monitored Anesthesia Care Procedure:                Pre-Anesthesia Assessment:                           - Prior to the procedure, a History and Physical                            was performed, and patient medications and                            allergies were reviewed. The patient's tolerance of                            previous anesthesia was also reviewed. The risks                            and benefits of the procedure and the sedation                            options and risks were discussed with the patient.                            All questions were answered, and informed consent                            was obtained. Prior Anticoagulants: The patient has                            taken no previous anticoagulant or antiplatelet                            agents. ASA Grade Assessment: II - A patient with                            mild systemic disease. After reviewing the risks                            and benefits, the patient was deemed in                            satisfactory condition to undergo the procedure.                           After obtaining informed consent, the colonoscope  was passed under direct vision. Throughout the                            procedure, the patient's blood pressure, pulse, and                            oxygen saturations were monitored continuously. The                            Colonoscope was introduced through the anus and                            advanced to the the cecum, identified by                            appendiceal orifice and  ileocecal valve. The                            colonoscopy was performed without difficulty. The                            patient tolerated the procedure well. The quality                            of the bowel preparation was excellent. The                            ileocecal valve, appendiceal orifice, and rectum                            were photographed. Scope In: 1:37:39 PM Scope Out: 1:45:50 PM Scope Withdrawal Time: 0 hours 6 minutes 34 seconds  Total Procedure Duration: 0 hours 8 minutes 11 seconds  Findings:                 A few small-mouthed diverticula were found in the                            left colon.                           The exam was otherwise without abnormality on                            direct and retroflexion views. Complications:            No immediate complications. Estimated blood loss:                            None. Estimated Blood Loss:     Estimated blood loss: none. Impression:               - Diverticulosis in the left colon.                           - The examination was otherwise normal on direct  and retroflexion views.                           - No specimens collected. Recommendation:           - Patient has a contact number available for                            emergencies. The signs and symptoms of potential                            delayed complications were discussed with the                            patient. Return to normal activities tomorrow.                            Written discharge instructions were provided to the                            patient.                           - Resume previous diet.                           - Continue present medications.                           - You do not need any further colon cancer                            screening tests (including stool testing). These                            types of tests generally stop around age 31-80. Milus Banister, MD 10/12/2016 1:49:14 PM This report has been signed electronically.

## 2016-10-12 NOTE — Patient Instructions (Signed)
YOU HAD AN ENDOSCOPIC PROCEDURE TODAY AT THE [facility]:   Refer to the procedure report that was given to you for any specific questions about what was found during the examination.  If the procedure report does not answer your questions, please call your gastroenterologist to clarify.  If you requested that your care partner not be given the details of your procedure findings, then the procedure report has been included in a sealed envelope for you to review at your convenience later.  YOU SHOULD EXPECT: Some feelings of bloating in the abdomen. Passage of more gas than usual.  Walking can help get rid of the air that was put into your GI tract during the procedure and reduce the bloating. If you had a lower endoscopy (such as a colonoscopy or flexible sigmoidoscopy) you may notice spotting of blood in your stool or on the toilet paper. If you underwent a bowel prep for your procedure, you may not have a normal bowel movement for a few days.  Please Note:  You might notice some irritation and congestion in your nose or some drainage.  This is from the oxygen used during your procedure.  There is no need for concern and it should clear up in a day or so.  SYMPTOMS TO REPORT IMMEDIATELY:   Following lower endoscopy (colonoscopy or flexible sigmoidoscopy):  Excessive amounts of blood in the stool  Significant tenderness or worsening of abdominal pains  Swelling of the abdomen that is new, acute  Fever of 100F or higher    For urgent or emergent issues, a gastroenterologist can be reached at any hour by calling [phone].   DIET:  We do recommend a small meal at first, but then you may proceed to your regular diet.  Drink plenty of fluids but you should avoid alcoholic beverages for 24 hours.  ACTIVITY:  You should plan to take it easy for the rest of today and you should NOT DRIVE or use heavy machinery until tomorrow (because of the sedation medicines used during the test).     FOLLOW UP: Our staff will call the number listed on your records the next business day following your procedure to check on you and address any questions or concerns that you may have regarding the information given to you following your procedure. If we do not reach you, we will leave a message.  However, if you are feeling well and you are not experiencing any problems, there is no need to return our call.  We will assume that you have returned to your regular daily activities without incident.  If any biopsies were taken you will be contacted by phone or by letter within the next 1-3 weeks.  Please call us at [phone] if you have not heard about the biopsies in 3 weeks.    SIGNATURES/CONFIDENTIALITY: You and/or your care partner have signed paperwork which will be entered into your electronic medical record.  These signatures attest to the fact that that the information above on your After Visit Summary has been reviewed and is understood.  Full responsibility of the confidentiality of this discharge information lies with you and/or your care-partner.   Resume medications. Information given on diverticulosis. 

## 2016-10-12 NOTE — Progress Notes (Signed)
Pt's states no medical or surgical changes since previsit or office visit. 

## 2016-10-13 ENCOUNTER — Telehealth: Payer: Self-pay

## 2016-10-13 NOTE — Telephone Encounter (Signed)
  Follow up Call-  Call back number 10/12/2016  Post procedure Call Back phone  # [phone]  Permission to leave phone message Yes  Some recent data might be hidden     Patient questions:  Do you have a fever, pain , or abdominal swelling? No. Pain Score  0 *  Have you tolerated food without any problems? Yes.    Have you been able to return to your normal activities? Yes.    Do you have any questions about your discharge instructions: Diet   No. Medications  No. Follow up visit  No.  Do you have questions or concerns about your Care? No.  Actions: * If pain score is 4 or above: No action needed, pain <4.

## 2016-10-15 DIAGNOSIS — H524 Presbyopia: Secondary | ICD-10-CM | POA: Diagnosis not present

## 2016-11-12 ENCOUNTER — Ambulatory Visit (INDEPENDENT_AMBULATORY_CARE_PROVIDER_SITE_OTHER): Payer: Medicare HMO | Admitting: Internal Medicine

## 2016-11-12 ENCOUNTER — Encounter: Payer: Self-pay | Admitting: Internal Medicine

## 2016-11-12 ENCOUNTER — Other Ambulatory Visit (INDEPENDENT_AMBULATORY_CARE_PROVIDER_SITE_OTHER): Payer: Medicare HMO

## 2016-11-12 VITALS — BP 128/70 | HR 59 | Temp 98.0°F | Ht 66.5 in | Wt 183.0 lb

## 2016-11-12 DIAGNOSIS — M545 Low back pain, unspecified: Secondary | ICD-10-CM | POA: Insufficient documentation

## 2016-11-12 DIAGNOSIS — Z0000 Encounter for general adult medical examination without abnormal findings: Secondary | ICD-10-CM | POA: Diagnosis not present

## 2016-11-12 DIAGNOSIS — E785 Hyperlipidemia, unspecified: Secondary | ICD-10-CM | POA: Diagnosis not present

## 2016-11-12 DIAGNOSIS — Z23 Encounter for immunization: Secondary | ICD-10-CM

## 2016-11-12 DIAGNOSIS — G8929 Other chronic pain: Secondary | ICD-10-CM | POA: Insufficient documentation

## 2016-11-12 LAB — LIPID PANEL
CHOL/HDL RATIO: 4
CHOLESTEROL: 146 mg/dL (ref 0–200)
HDL: 40.1 mg/dL (ref >39.00)
LDL Cholesterol: 90 mg/dL (ref 0–99)
NonHDL: 105.86
TRIGLYCERIDES: 77 mg/dL (ref 0.0–149.0)
VLDL: 15.4 mg/dL (ref 0.0–40.0)

## 2016-11-12 LAB — CBC WITH DIFFERENTIAL/PLATELET
BASOS PCT: 0.8 % (ref 0.0–3.0)
Basophils Absolute: 0 10*3/uL (ref 0.0–0.1)
EOS ABS: 0.2 10*3/uL (ref 0.0–0.7)
Eosinophils Relative: 3.8 % (ref 0.0–5.0)
HEMATOCRIT: 44.7 % (ref 39.0–52.0)
Hemoglobin: 15.1 g/dL (ref 13.0–17.0)
LYMPHS ABS: 2.1 10*3/uL (ref 0.7–4.0)
Lymphocytes Relative: 47.9 % — ABNORMAL HIGH (ref 12.0–46.0)
MCHC: 33.7 g/dL (ref 30.0–36.0)
MCV: 102.6 fl — ABNORMAL HIGH (ref 78.0–100.0)
MONO ABS: 0.4 10*3/uL (ref 0.1–1.0)
Monocytes Relative: 9.2 % (ref 3.0–12.0)
NEUTROS ABS: 1.7 10*3/uL (ref 1.4–7.7)
NEUTROS PCT: 38.3 % — AB (ref 43.0–77.0)
PLATELETS: 201 10*3/uL (ref 150.0–400.0)
RBC: 4.35 Mil/uL (ref 4.22–5.81)
RDW: 13.1 % (ref 11.5–15.5)
WBC: 4.4 10*3/uL (ref 4.0–10.5)

## 2016-11-12 LAB — BASIC METABOLIC PANEL
BUN: 17 mg/dL (ref 6–23)
CHLORIDE: 107 meq/L (ref 96–112)
CO2: 28 mEq/L (ref 19–32)
CREATININE: 0.8 mg/dL (ref 0.40–1.50)
Calcium: 9.1 mg/dL (ref 8.4–10.5)
GFR: 99.75 mL/min (ref >60.00)
Glucose, Bld: 97 mg/dL (ref 70–99)
Potassium: 4.5 mEq/L (ref 3.5–5.1)
Sodium: 141 mEq/L (ref 135–145)

## 2016-11-12 LAB — HEPATIC FUNCTION PANEL
ALK PHOS: 59 U/L (ref 39–117)
ALT: 19 U/L (ref 0–53)
AST: 19 U/L (ref 0–37)
Albumin: 4 g/dL (ref 3.5–5.2)
BILIRUBIN DIRECT: 0.1 mg/dL (ref 0.0–0.3)
TOTAL PROTEIN: 6.5 g/dL (ref 6.0–8.3)
Total Bilirubin: 0.6 mg/dL (ref 0.2–1.2)

## 2016-11-12 LAB — URINALYSIS
Bilirubin Urine: NEGATIVE
KETONES UR: NEGATIVE
Leukocytes, UA: NEGATIVE
Nitrite: NEGATIVE
PH: 5.5 (ref 5.0–8.0)
SPECIFIC GRAVITY, URINE: 1.025 (ref 1.000–1.030)
Total Protein, Urine: NEGATIVE
URINE GLUCOSE: NEGATIVE
Urobilinogen, UA: 0.2 (ref 0.0–1.0)

## 2016-11-12 LAB — TSH: TSH: 1.43 u[IU]/mL (ref 0.35–4.50)

## 2016-11-12 MED ORDER — CYCLOBENZAPRINE HCL 5 MG PO TABS: 5.0000 mg | ORAL_TABLET | Freq: Three times a day (TID) | ORAL | 1 refills | Status: DC | PRN

## 2016-11-12 MED ORDER — ZOSTER VAC RECOMB ADJUVANTED 50 MCG/0.5ML IM SUSR: 0.5000 mL | Freq: Once | INTRAMUSCULAR | 1 refills | Status: AC

## 2016-11-12 NOTE — Assessment & Plan Note (Signed)
Here for medicare wellness/physical  Diet: heart healthy  Physical activity: not sedentary - gym, golf Depression/mood screen: negative  Hearing: intact to whispered voice  Visual acuity: grossly normal, performs annual eye exam  ADLs: capable  Fall risk: none  Home safety: good  Cognitive evaluation: intact to orientation, naming, recall and repetition  EOL planning: adv directives, full code/ I agree  I have personally reviewed and have noted  1. The patient's medical and social history  2. Their use of alcohol, tobacco or illicit drugs  3. Their current medications and supplements  4. The patient's functional ability including ADL's, fall risks, home safety risks and hearing or visual impairment.  5. Diet and physical activities  6. Evidence for depression or mood disorders 7. Providers roster was reviewed   The patient is here for annual Medicare wellness examination and management of other chronic and acute problems.   The risk factors are reflected in the social history.  The roster of all physicians providing medical care to patient - is listed in the Snapshot section of the chart.  Activities of daily living:  The patient is 100% inedpendent in all ADLs: dressing, toileting, feeding as well as independent mobility  Home safety : good. Using seatbelts. There is no violence in the home.   There is no risks for hepatitis, STDs or HIV. There is no history of blood transfusion. They have no travel history to infectious disease endemic areas of the world.  The patient has  seen their dentist in the last 12 month. They have  seen their eye doctor in the last year. They deny  Any major hearing difficulty and have not had audiologic testing in the last year.  They do not  have excessive sun exposure. Discussed the need for sun protection: hats, long sleeves and use of sunscreen if there is significant sun exposure.   Diet: the importance of a healthy diet is discussed. They do have  a reasonably healthy  diet.  The patient has a fairly regular exercise program of a mixed nature: walking, yard work, etc.The benefits of regular aerobic exercise were discussed.  Depression screen: there are no signs or vegative symptoms of depression- irritability, change in appetite, anhedonia, sadness/tearfullness.  Cognitive assessment: the patient manages all their financial and personal affairs and is actively engaged. They could relate day,date,year and events; recalled 3/3 objects at 3 minutes  The following portions of the patient's history were reviewed and updated as appropriate: allergies, current medications, past family history, past medical history,  past surgical history, past social history  and problem list.  Vision, hearing, body mass index were assessed and reviewed.   During the course of the visit the patient was educated and counseled about appropriate screening and preventive services including : fall prevention , diabetes screening, nutrition counseling, colorectal cancer screening, and recommended immunizations.  [facility] 2018

## 2016-11-12 NOTE — Progress Notes (Signed)
Subjective:  Patient ID: Jeremy [name], male    DOB: 1940/05/15  Age: 76 y.o. MRN: [id number]  CC: No chief complaint on file.   HPI Jeremy [name] presents for a well exam, [facility] well C/o LBP x 1 week  Outpatient Medications Prior to Visit  Medication Sig Dispense Refill  . Acetaminophen (APAP 500 PO) Take 1 tablet by mouth 2 (two) times daily. Reported on 02/26/2015    . azelastine (ASTELIN) 0.1 % nasal spray Use two sprays in each nostril twice daily as needed for runny nose and sneezing. 30 mL 11  . budesonide (RHINOCORT ALLERGY) 32 MCG/ACT nasal spray Place 1 spray into both nostrils as needed. Reported on 02/26/2015    . Cholecalciferol 1000 UNITS tablet Take 1,000 Units by mouth daily.      . montelukast (SINGULAIR) 10 MG tablet Take 10 mg by mouth as needed. Reported on 04/22/2015    . Omega-3 Fatty Acids (FISH OIL) 1000 MG CAPS Take 2 capsules by mouth daily.      No facility-administered medications prior to visit.     ROS Review of Systems  Constitutional: Negative for appetite change, fatigue and unexpected weight change.  HENT: Negative for congestion, nosebleeds, sneezing, sore throat and trouble swallowing.   Eyes: Negative for itching and visual disturbance.  Respiratory: Negative for cough.   Cardiovascular: Negative for chest pain, palpitations and leg swelling.  Gastrointestinal: Negative for abdominal distention, blood in stool, diarrhea and nausea.  Genitourinary: Negative for frequency and hematuria.  Musculoskeletal: Positive for back pain. Negative for gait problem, joint swelling and neck pain.  Skin: Negative for rash.  Neurological: Negative for dizziness, tremors, speech difficulty and weakness.  Psychiatric/Behavioral: Negative for agitation, dysphoric mood and sleep disturbance. The patient is not nervous/anxious.     Objective:  BP 128/70 (BP Location: Left Arm, Patient Position: Sitting, Cuff Size: Normal)   Pulse (!) 59   Temp 98 F (36.7  C) (Oral)   Ht 5' 6.5" (1.689 m)   Wt 183 lb (83 kg)   SpO2 99%   BMI 29.09 kg/m   BP Readings from Last 3 Encounters:  11/12/16 128/70  10/12/16 124/76  08/21/16 (!) 144/66    Wt Readings from Last 3 Encounters:  11/12/16 183 lb (83 kg)  10/12/16 184 lb (83.5 kg)  08/21/16 184 lb 2 oz (83.5 kg)    Physical Exam  Constitutional: He is oriented to person, place, and time. He appears well-developed. No distress.  NAD  HENT:  Mouth/Throat: Oropharynx is clear and moist.  Eyes: Pupils are equal, round, and reactive to light. Conjunctivae are normal.  Neck: Normal range of motion. No JVD present. No thyromegaly present.  Cardiovascular: Normal rate, regular rhythm, normal heart sounds and intact distal pulses.  Exam reveals no gallop and no friction rub.   No murmur heard. Pulmonary/Chest: Effort normal and breath sounds normal. No respiratory distress. He has no wheezes. He has no rales. He exhibits no tenderness.  Abdominal: Soft. Bowel sounds are normal. He exhibits no distension and no mass. There is no tenderness. There is no rebound and no guarding.  Musculoskeletal: Normal range of motion. He exhibits no edema or tenderness.  Lymphadenopathy:    He has no cervical adenopathy.  Neurological: He is alert and oriented to person, place, and time. He has normal reflexes. No cranial nerve deficit. He exhibits normal muscle tone. He displays a negative Romberg sign. Coordination and gait normal.  Skin: Skin is warm  and dry. No rash noted.  Psychiatric: He has a normal mood and affect. His behavior is normal. Judgment and thought content normal.   Procedure: EKG Indication: dyslipidemia, 1st degree AV block Impression: NSR. 1st degree AV block. No acute changes.  Lab Results  Component Value Date   WBC 5.4 11/07/2015   HGB 15.3 11/07/2015   HCT 44.0 11/07/2015   PLT 211.0 11/07/2015   GLUCOSE 106 (H) 11/07/2015   CHOL 140 11/07/2015   TRIG 54.0 11/07/2015   HDL 41.60  11/07/2015   LDLCALC 88 11/07/2015   ALT 28 11/07/2015   AST 23 11/07/2015   NA 140 11/07/2015   K 4.2 11/07/2015   CL 106 11/07/2015   CREATININE 0.81 11/07/2015   BUN 16 11/07/2015   CO2 28 11/07/2015   TSH 1.47 11/07/2015   PSA 0.01 (L) 10/20/2013   INR 1.0 ratio 03/27/2008    Dg Knee Complete 4 Views Left  Result Date: 02/03/2013 CLINICAL DATA:  One month history of progressive left knee pain. EXAM: LEFT KNEE - COMPLETE 4+ VIEW COMPARISON:  None. FINDINGS: The bones of the knee are adequately mineralized. There are mild degenerative changes present involving the medial lateral joint compartments. There is beaking of the tibial spines. There is faint chondrocalcinosis of the menisci. There is a tiny spur from the superior margin of the patella. There is no significant joint effusion. There is no evidence of an acute or healing fracture. IMPRESSION: There are mild degenerative changes involving the medial and lateral joint compartments. No acute bony abnormality is demonstrated. Electronically Signed   By: Jeremy  [name]   On: 02/03/2013 08:48    Assessment & Plan:   There are no diagnoses linked to this encounter. I am having [name] maintain his Fish Oil, Cholecalciferol, Acetaminophen (APAP 500 PO), montelukast, budesonide, and azelastine.  No orders of the defined types were placed in this encounter.    Follow-up: No Follow-up on file.  Walker Kehr, MD

## 2016-11-12 NOTE — Assessment & Plan Note (Signed)
EKG

## 2016-11-12 NOTE — Assessment & Plan Note (Signed)
Flexeril prn ROM exercises

## 2016-12-22 ENCOUNTER — Encounter: Payer: Self-pay | Admitting: Internal Medicine

## 2016-12-24 DIAGNOSIS — D1801 Hemangioma of skin and subcutaneous tissue: Secondary | ICD-10-CM | POA: Diagnosis not present

## 2016-12-24 DIAGNOSIS — L570 Actinic keratosis: Secondary | ICD-10-CM | POA: Diagnosis not present

## 2016-12-24 DIAGNOSIS — Z85828 Personal history of other malignant neoplasm of skin: Secondary | ICD-10-CM | POA: Diagnosis not present

## 2016-12-24 DIAGNOSIS — L814 Other melanin hyperpigmentation: Secondary | ICD-10-CM | POA: Diagnosis not present

## 2016-12-24 DIAGNOSIS — R69 Illness, unspecified: Secondary | ICD-10-CM | POA: Diagnosis not present

## 2016-12-24 DIAGNOSIS — L821 Other seborrheic keratosis: Secondary | ICD-10-CM | POA: Diagnosis not present

## 2017-01-25 DIAGNOSIS — R69 Illness, unspecified: Secondary | ICD-10-CM | POA: Diagnosis not present

## 2017-02-10 ENCOUNTER — Encounter: Payer: Self-pay | Admitting: Allergy and Immunology

## 2017-02-10 ENCOUNTER — Ambulatory Visit: Payer: Medicare HMO | Admitting: Allergy and Immunology

## 2017-02-10 VITALS — BP 134/70 | HR 68 | Resp 16 | Ht 66.0 in | Wt 184.0 lb

## 2017-02-10 DIAGNOSIS — T485X5A Adverse effect of other anti-common-cold drugs, initial encounter: Secondary | ICD-10-CM

## 2017-02-10 DIAGNOSIS — J3089 Other allergic rhinitis: Secondary | ICD-10-CM

## 2017-02-10 DIAGNOSIS — J310 Chronic rhinitis: Secondary | ICD-10-CM | POA: Diagnosis not present

## 2017-02-10 DIAGNOSIS — T485X1A Poisoning by other anti-common-cold drugs, accidental (unintentional), initial encounter: Secondary | ICD-10-CM

## 2017-02-10 MED ORDER — METHYLPREDNISOLONE ACETATE 80 MG/ML IJ SUSP
80.0000 mg | Freq: Once | INTRAMUSCULAR | Status: AC
  Administered 2017-02-10: 80 mg via INTRAMUSCULAR

## 2017-02-10 MED ORDER — AZELASTINE HCL 0.1 % NA SOLN: NASAL | 5 refills | Status: DC

## 2017-02-10 MED ORDER — MONTELUKAST SODIUM 10 MG PO TABS: 10.0000 mg | ORAL_TABLET | Freq: Every day | ORAL | 5 refills | Status: DC

## 2017-02-10 NOTE — Progress Notes (Signed)
Follow-up Note  Referring Provider: Cassandria Anger, MD Primary Provider: Cassandria Anger, MD Date of Office Visit: 02/10/2017  Subjective:   Jeremy [name] (DOB: Nov 22, 1940) is a 77 y.o. male who returns to the [facility] on 02/10/2017 in re-evaluation of the following:  HPI: Jeremy [name] returns to this clinic in evaluation of his allergic rhinitis.  His last visit to this clinic was 26 February 2015.  Overall he has really done very well until the past 3 weeks.  He has developed significant nasal congestion at nighttime and some decreased ability to smell and sneezing and runny nose.  He does not have any ugly nasal discharge or fever or headaches.  He has been using Afrin on a consistent basis over the course of the past 2 weeks.  He restarted his Rhinocort the past week.  He is out of his montelukast.  Allergies as of 02/10/2017      Reactions   Aspirin    REACTION: gi      Medication List      APAP 500 PO Take 1 tablet by mouth 2 (two) times daily. Reported on 02/26/2015   azelastine 0.1 % nasal spray Commonly known as:  ASTELIN Use two sprays in each nostril twice daily as needed for runny nose and sneezing.   Cholecalciferol 1000 units tablet Take 1,000 Units by mouth daily.   Fish Oil 1000 MG Caps Take 2 capsules by mouth daily.   RHINOCORT ALLERGY 32 MCG/ACT nasal spray Generic drug:  budesonide Place 1 spray into both nostrils as needed. Reported on 02/26/2015       Past Medical History:  Diagnosis Date  . AK (actinic keratosis)   . Allergic rhinitis   . History of colonic polyps    Dr. Ardis Hughs- Due 2018  . History of skin cancer   . Osteopenia   . Prostate cancer ([place])    hx of s/p XRT, seeds Dr. Risa Grill    Past Surgical History:  Procedure Laterality Date  . INSERTION PROSTATE RADIATION SEED    . KNEE ARTHROSCOPY Right   . KNEE SURGERY Left   . TONSILLECTOMY    . XRT  2008    Review of systems negative except as noted  in HPI / PMHx or noted below:  Review of Systems  Constitutional: Negative.   HENT: Negative.   Eyes: Negative.   Respiratory: Negative.   Cardiovascular: Negative.   Gastrointestinal: Negative.   Genitourinary: Negative.   Musculoskeletal: Negative.   Skin: Negative.   Neurological: Negative.   Endo/Heme/Allergies: Negative.   Psychiatric/Behavioral: Negative.      Objective:   Vitals:   02/10/17 1050  BP: 134/70  Pulse: 68  Resp: 16  SpO2: 98%   Height: 5\' 6"  (167.6 cm)  Weight: 184 lb (83.5 kg)   Physical Exam  Constitutional: He is well-developed, well-nourished, and in no distress.  HENT:  Head: Normocephalic.  Right Ear: Tympanic membrane, external ear and ear canal normal.  Left Ear: Tympanic membrane, external ear and ear canal normal.  Nose: Mucosal edema (Erythematous) present. No rhinorrhea.  Mouth/Throat: Uvula is midline, oropharynx is clear and moist and mucous membranes are normal. No oropharyngeal exudate.  Eyes: Conjunctivae are normal.  Neck: Trachea normal. No tracheal tenderness present. No tracheal deviation present. No thyromegaly present.  Cardiovascular: Normal rate, regular rhythm, S1 normal, S2 normal and normal heart sounds.  No murmur heard. Pulmonary/Chest: Breath sounds normal. No stridor. No respiratory distress. He  has no wheezes. He has no rales.  Musculoskeletal: He exhibits no edema.  Lymphadenopathy:       Head (right side): No tonsillar adenopathy present.       Head (left side): No tonsillar adenopathy present.    He has no cervical adenopathy.  Neurological: He is alert. Gait normal.  Skin: No rash noted. He is not diaphoretic. No erythema. Nails show no clubbing.  Psychiatric: Mood and affect normal.    Diagnostics: none  Assessment and Plan:   1. Other allergic rhinitis   2. Rhinitis medicamentosa     1. Depo-Medrol 80 IM delivered in the clinic today  2. Rhinocort one spray each nostril one time per day  3.  Montelukast 10 mg one tablet one time per day  4. If needed:   A. Astelin 2 sprays each nostril twice a day  B. over-the-counter Claritin or Zyrtec 10 mg one time per day  C. nasal saline  5. Discontinue Afrin  6. Return to clinic in 1 year or earlier if problem  Jeremy [name] will utilize systemic steroids to help with his respiratory tract inflammation and restart all of his other anti-inflammatory medications as noted above and he needs to discontinue his Afrin as well.  Assuming he does well I will see him back in his clinic in 1 year or earlier if there is a problem.  Allena Katz, MD [facility] [place]

## 2017-02-10 NOTE — Patient Instructions (Signed)
  1. Depo-Medrol 80 IM delivered in the clinic today  2. Rhinocort one spray each nostril one time per day  3. Montelukast 10 mg one tablet one time per day  4. If needed:   A. Astelin 2 sprays each nostril twice a day  B. over-the-counter Claritin or Zyrtec 10 mg one time per day  C. nasal saline  5. Discontinue Afrin  6. Return to clinic in 1 year or earlier if problem 

## 2017-02-11 ENCOUNTER — Encounter: Payer: Self-pay | Admitting: Allergy and Immunology

## 2017-07-01 DIAGNOSIS — L570 Actinic keratosis: Secondary | ICD-10-CM | POA: Diagnosis not present

## 2017-07-01 DIAGNOSIS — L814 Other melanin hyperpigmentation: Secondary | ICD-10-CM | POA: Diagnosis not present

## 2017-07-01 DIAGNOSIS — D225 Melanocytic nevi of trunk: Secondary | ICD-10-CM | POA: Diagnosis not present

## 2017-07-01 DIAGNOSIS — D485 Neoplasm of uncertain behavior of skin: Secondary | ICD-10-CM | POA: Diagnosis not present

## 2017-07-01 DIAGNOSIS — C4402 Squamous cell carcinoma of skin of lip: Secondary | ICD-10-CM | POA: Diagnosis not present

## 2017-07-01 DIAGNOSIS — R69 Illness, unspecified: Secondary | ICD-10-CM | POA: Diagnosis not present

## 2017-07-01 DIAGNOSIS — C44622 Squamous cell carcinoma of skin of right upper limb, including shoulder: Secondary | ICD-10-CM | POA: Diagnosis not present

## 2017-07-01 DIAGNOSIS — D1801 Hemangioma of skin and subcutaneous tissue: Secondary | ICD-10-CM | POA: Diagnosis not present

## 2017-07-28 NOTE — Progress Notes (Signed)
[name] [name], [name] [address] Phone: [phone] Subjective:      CC: Right knee pain  BWL:[id number]  Jeremy [name] is a 77 y.o. male coming in with complaint of right knee pain. Last Saturday he states he felt like his knee collapsed and he could barely walk. States that he has gotten better since then but wants to know what caused the pain. Has a knee brace.   Onset- Last saturday Location- Medial Duration- 3 days of pain Character- Sharp Aggravating factors- Taking a step  Reliving factors-  Therapies tried- Pennsaid, heating  Severity-initially 7 out of 10 but now only 2 out of 10     Past Medical History:  Diagnosis Date  . AK (actinic keratosis)   . Allergic rhinitis   . History of colonic polyps    Dr. Ardis Hughs- Due 2018  . History of skin cancer   . Osteopenia   . Prostate cancer ([place])    hx of s/p XRT, seeds Dr. Risa Grill   Past Surgical History:  Procedure Laterality Date  . INSERTION PROSTATE RADIATION SEED    . KNEE ARTHROSCOPY Right   . KNEE SURGERY Left   . TONSILLECTOMY    . XRT  2008   Social History   Socioeconomic History  . Marital status: Married    Spouse name: Not on file  . Number of children: 2  . Years of education: Not on file  . Highest education level: Not on file  Occupational History  . Occupation: retired from [employer]  Social Needs  . Financial resource strain: Not on file  . Food insecurity:    Worry: Not on file    Inability: Not on file  . Transportation needs:    Medical: Not on file    Non-medical: Not on file  Tobacco Use  . Smoking status: Current Some Day Smoker    Types: Cigars  . Smokeless tobacco: Never Used  . Tobacco comment: 2 cigars a month  Substance and Sexual Activity  . Alcohol use: Yes    Alcohol/week: 1.2 oz    Types: 2 Cans of beer per week  . Drug use: No  . Sexual activity: Yes  Lifestyle  . Physical activity:    Days per week: Not on file      Minutes per session: Not on file  . Stress: Not on file  Relationships  . Social connections:    Talks on phone: Not on file    Gets together: Not on file    Attends religious service: Not on file    Active member of club or organization: Not on file    Attends meetings of clubs or organizations: Not on file    Relationship status: Not on file  Other Topics Concern  . Not on file  Social History Narrative  . Not on file   Allergies  Allergen Reactions  . Aspirin     REACTION: gi   Family History  Problem Relation Age of Onset  . Liver disease Father   . Cancer Father        pancreas ?  Marland Kitchen Cancer - Other Unknown        intestinal cancer     Past medical history, social, surgical and family history all reviewed in electronic medical record.  No pertanent information unless stated regarding to the chief complaint.   Review of Systems:Review of systems updated and as accurate as of 07/30/17  No headache, visual changes, nausea, vomiting, diarrhea, constipation, dizziness, abdominal pain, skin rash, fevers, chills, night sweats, weight loss, swollen lymph nodes, body aches, chest pain, shortness of breath, mood changes.  Positive muscle aches and joint swelling  Objective  Blood pressure 140/70, pulse 72, height 5\' 6"  (1.676 m), weight 184 lb (83.5 kg), SpO2 98 %. Systems examined below as of 07/30/17   General: No apparent distress alert and oriented x3 mood and affect normal, dressed appropriately.  HEENT: Pupils equal, extraocular movements intact  Respiratory: Patient's speak in full sentences and does not appear short of breath  Cardiovascular: No lower extremity edema, non tender, no erythema  Skin: Warm dry intact with no signs of infection or rash on extremities or on axial skeleton.  Abdomen: Soft nontender  Neuro: Cranial nerves II through XII are intact, neurovascularly intact in all extremities with 2+ DTRs and 2+ pulses.  Lymph: No lymphadenopathy of posterior  or anterior cervical chain or axillae bilaterally.  Gait normal with good balance and coordination.  MSK:  Non tender with full range of motion and good stability and symmetric strength and tone of shoulders, elbows, wrist, hip, and ankles bilaterally.  Knee: Right valgus deformity noted.  Mild abnormal thigh to calf ratio.  Tender to palpation over medial and PF joint line.  ROM full in flexion and extension and lower leg rotation. instability with valgus force.  painful patellar compression. Patellar glide with mild crepitus. Patellar and quadriceps tendons unremarkable. Hamstring and quadriceps strength is normal. Contralateral knee shows mild to moderate arthritic changes as well  MSK US performed of: Right knee This study was ordered, performed, and interpreted by Charlann Boxer D.O.  Knee: Significant narrowing of the patellofemoral joint from hypoechoic changes.  Small effusion noted.  No loose bodies.  Questionable posterior patellar contusion on the lateral aspect  IMPRESSION: Patellofemoral arthritis  97110; 15 additional minutes spent for Therapeutic exercises as stated in above notes.  This included exercises focusing on stretching, strengthening, with significant focus on eccentric aspects.   Long term goals include an improvement in range of motion, strength, endurance as well as avoiding reinjury. Patient's frequency would include in 1-2 times a day, 3-5 times a week for a duration of 6-12 weeks. Patellofemoral Syndrome  Reviewed anatomy using anatomical model and how PFS occurs.  Given rehab exercises handout for VMO, hip abductors, core, entire kinetic chain including proprioception exercises including cone touches, step downs, hip elevations and turn outs.  Could benefit from PT, regular exercise, upright biking, and a PFS knee brace to assist with tracking abnormalities.  Proper technique shown and discussed handout in great detail with ATC.  All questions were discussed  and answered.      Impression and Recommendations:     This case required medical decision making of moderate complexity.      Note: This dictation was prepared with Dragon dictation along with smaller phrase technology. Any transcriptional errors that result from this process are unintentional.

## 2017-07-29 DIAGNOSIS — C4402 Squamous cell carcinoma of skin of lip: Secondary | ICD-10-CM | POA: Diagnosis not present

## 2017-07-29 DIAGNOSIS — L905 Scar conditions and fibrosis of skin: Secondary | ICD-10-CM | POA: Diagnosis not present

## 2017-07-29 DIAGNOSIS — C44622 Squamous cell carcinoma of skin of right upper limb, including shoulder: Secondary | ICD-10-CM | POA: Diagnosis not present

## 2017-07-30 ENCOUNTER — Encounter: Payer: Self-pay | Admitting: Family Medicine

## 2017-07-30 ENCOUNTER — Ambulatory Visit: Payer: Self-pay

## 2017-07-30 ENCOUNTER — Ambulatory Visit: Payer: Medicare HMO | Admitting: Family Medicine

## 2017-07-30 VITALS — BP 140/70 | HR 72 | Ht 66.0 in | Wt 184.0 lb

## 2017-07-30 DIAGNOSIS — M25561 Pain in right knee: Secondary | ICD-10-CM

## 2017-07-30 DIAGNOSIS — M1711 Unilateral primary osteoarthritis, right knee: Secondary | ICD-10-CM

## 2017-07-30 NOTE — Patient Instructions (Signed)
Good to see you  Ice 20 minutes 2 times daily. Usually after activity and before bed. Exercises 3 times a week.  pennsaid pinkie amount topically 2 times daily as needed.  Wear a brace with a lot of walking See me again in 2-3 weeks to make sure you are fine and make sure you do not need an injection before the [facility]

## 2017-07-30 NOTE — Assessment & Plan Note (Signed)
Believe the patient did have some mild subluxation and some more of a patellofemoral arthritis.  Discussed icing regimen, home exercise, which activities of doing which wants to avoid.  Patient given a brace AAA.  Hopefully this will be beneficial.  Discussed which activities to doing which was to avoid.

## 2017-08-13 NOTE — Progress Notes (Signed)
[name] [name], [name] [address] Phone: [phone] Subjective:     CC: Right knee pain follow-up, left hip pain  Jeremy [name]:[id number]  Jeremy [name] is a 77 y.o. male coming in with complaint of right knee pain. He said that his knee is doing well as he walked during the [place]. Is here today for left hip pain. Pain at night when he rolls onto that side. Pain is upper glute. Did have some pain with walking downhill.  Reviewing patient's chart patient did have x-rays in 2012 for similar problem.  X-rays were independently visualized by me showing some mild sclerotic changes of the anterior iliac spine.  Otherwise fairly unremarkable.  History of prostate cancer.  Denies any fevers chills or any abnormal weight loss.  Seems to hurt more to pressure.  Did notice at the end of a lot of walking and started having more increasing discomfort and pain.       Past Medical History:  Diagnosis Date  . AK (actinic keratosis)   . Allergic rhinitis   . History of colonic polyps    Dr. Ardis Hughs- Due 2018  . History of skin cancer   . Osteopenia   . Prostate cancer ([place])    hx of s/p XRT, seeds Dr. Risa Grill   Past Surgical History:  Procedure Laterality Date  . INSERTION PROSTATE RADIATION SEED    . KNEE ARTHROSCOPY Right   . KNEE SURGERY Left   . TONSILLECTOMY    . XRT  2008   Social History   Socioeconomic History  . Marital status: Married    Spouse name: Not on file  . Number of children: 2  . Years of education: Not on file  . Highest education level: Not on file  Occupational History  . Occupation: retired from [employer]  Social Needs  . Financial resource strain: Not on file  . Food insecurity:    Worry: Not on file    Inability: Not on file  . Transportation needs:    Medical: Not on file    Non-medical: Not on file  Tobacco Use  . Smoking status: Current Some Day Smoker    Types: Cigars  . Smokeless tobacco: Never Used  .  Tobacco comment: 2 cigars a month  Substance and Sexual Activity  . Alcohol use: Yes    Alcohol/week: 1.2 oz    Types: 2 Cans of beer per week  . Drug use: No  . Sexual activity: Yes  Lifestyle  . Physical activity:    Days per week: Not on file    Minutes per session: Not on file  . Stress: Not on file  Relationships  . Social connections:    Talks on phone: Not on file    Gets together: Not on file    Attends religious service: Not on file    Active member of club or organization: Not on file    Attends meetings of clubs or organizations: Not on file    Relationship status: Not on file  Other Topics Concern  . Not on file  Social History Narrative  . Not on file   Allergies  Allergen Reactions  . Aspirin     REACTION: gi   Family History  Problem Relation Age of Onset  . Liver disease Father   . Cancer Father        pancreas ?  Marland Kitchen Cancer - Other Unknown  intestinal cancer     Past medical history, social, surgical and family history all reviewed in electronic medical record.  No pertanent information unless stated regarding to the chief complaint.   Review of Systems:Review of systems updated and as accurate as of 08/16/17  No headache, visual changes, nausea, vomiting, diarrhea, constipation, dizziness, abdominal pain, skin rash, fevers, chills, night sweats, weight loss, swollen lymph nodes, body aches, joint swelling, muscle aches, chest pain, shortness of breath, mood changes.   Objective  Blood pressure 132/78, pulse 82, height 5\' 6"  (1.676 m), weight 189 lb (85.7 kg), SpO2 98 %. Systems examined below as of 08/16/17   General: No apparent distress alert and oriented x3 mood and affect normal, dressed appropriately.  HEENT: Pupils equal, extraocular movements intact  Respiratory: Patient's speak in full sentences and does not appear short of breath  Cardiovascular: 2+ lower extremity edema, non tender, no erythema  Skin: Warm dry intact with no signs  of infection or rash on extremities or on axial skeleton.  Abdomen: Soft nontender  Neuro: Cranial nerves II through XII are intact, neurovascularly intact in all extremities with 2+ DTRs and 2+ pulses.  Lymph: No lymphadenopathy of posterior or anterior cervical chain or axillae bilaterally.  Gait normal with good balance and coordination.  MSK:  Non tender with full range of motion and good stability and symmetric strength and tone of shoulders, elbows, wrist, , knee and ankles bilaterally.  Mild arthritic changes of multiple joints Hip: Left ROM IR: 25 Deg, ER: 25 Deg, Flexion: 100 Deg, Extension: 80 Deg, Abduction: 45 Deg, Adduction: 25 Deg Strength IR: 5/5, ER: 5/5, Flexion: 5/5, Extension: 5/5, Abduction: 5/5, Adduction: 5/5 Pelvic alignment unremarkable to inspection and palpation. Standing hip rotation and gait without trendelenburg sign / unsteadiness. Greater trochanter with mild tenderness noted pain also over the lateral aspect of the glue tendon No SI joint tenderness and normal minimal SI movement.    97110; 15 additional minutes spent for Therapeutic exercises as stated in above notes.  This included exercises focusing on stretching, strengthening, with significant focus on eccentric aspects.   Long term goals include an improvement in range of motion, strength, endurance as well as avoiding reinjury. Patient's frequency would include in 1-2 times a day, 3-5 times a week for a duration of 6-12 weeks.  Hip strengthening exercises which included:  Pelvic tilt/bracing to help with proper recruitment of the lower abs and pelvic floor muscles  Glute strengthening to properly contract glutes without over-engaging low back and hamstrings - prone hip extension and glute bridge exercises Proper stretching techniques to increase effectiveness for the hip flexors, groin, quads, piriformic and low back when appropriate   Proper technique shown and discussed handout in great detail with ATC.   All questions were discussed and answered.   Impression and Recommendations:     This case required medical decision making of moderate complexity.      Note: This dictation was prepared with Dragon dictation along with smaller phrase technology. Any transcriptional errors that result from this process are unintentional.

## 2017-08-16 ENCOUNTER — Ambulatory Visit: Payer: Medicare HMO | Admitting: Family Medicine

## 2017-08-16 ENCOUNTER — Ambulatory Visit: Payer: Self-pay

## 2017-08-16 ENCOUNTER — Ambulatory Visit (INDEPENDENT_AMBULATORY_CARE_PROVIDER_SITE_OTHER)
Admission: RE | Admit: 2017-08-16 | Discharge: 2017-08-16 | Disposition: A | Discharge status: Home / Self Care | Payer: Medicare HMO | Source: Ambulatory Visit | Attending: Family Medicine | Admitting: Family Medicine

## 2017-08-16 ENCOUNTER — Encounter: Payer: Self-pay | Admitting: Family Medicine

## 2017-08-16 VITALS — BP 132/78 | HR 82 | Ht 66.0 in | Wt 189.0 lb

## 2017-08-16 DIAGNOSIS — M25552 Pain in left hip: Secondary | ICD-10-CM

## 2017-08-16 DIAGNOSIS — M7062 Trochanteric bursitis, left hip: Secondary | ICD-10-CM | POA: Diagnosis not present

## 2017-08-16 NOTE — Assessment & Plan Note (Signed)
Greater trochanteric bursitis of the left hip.  Patient given home exercises, declined injection, x-rays ordered secondary to patient having a history of kidney prostate cancer.  No masses appreciated.  Discussed which activities to doing which wants to avoid.  Follow-up again in 4 to 6 weeks worsening symptoms consider formal physical therapy and injection

## 2017-08-16 NOTE — Patient Instructions (Addendum)
Good to see you  Ice 20 minutes 2 times daily. Usually after activity and before bed. Compression socks with a lot of walking.  Like running socks.  For the hip Exercises 3 times a week.   Stay active.  See me again in 4 weeks if not gone

## 2017-09-17 DIAGNOSIS — C61 Malignant neoplasm of prostate: Secondary | ICD-10-CM | POA: Diagnosis not present

## 2017-09-17 NOTE — Progress Notes (Signed)
[name] [place], [place] [address] Phone: [phone] Subjective:     I [name] am serving as a Education administrator for Dr. Hulan Saas.   CC: Left hip pain  YSA:[id number]  Jeremy [name] is a 77 y.o. male coming in with complaint of left hip pain. States that the hip has been doing well until last Thursday. Laying on the hip is painful. Pain hasn't gotten any worse.  Patient would state that it is still not completely improved now.  Will be traveling and would like some relief.       Past Medical History:  Diagnosis Date  . AK (actinic keratosis)   . Allergic rhinitis   . History of colonic polyps    Dr. Ardis Hughs- Due 2018  . History of skin cancer   . Osteopenia   . Prostate cancer ([place])    hx of s/p XRT, seeds Dr. Risa Grill   Past Surgical History:  Procedure Laterality Date  . INSERTION PROSTATE RADIATION SEED    . KNEE ARTHROSCOPY Right   . KNEE SURGERY Left   . TONSILLECTOMY    . XRT  2008   Social History   Socioeconomic History  . Marital status: Married    Spouse name: Not on file  . Number of children: 2  . Years of education: Not on file  . Highest education level: Not on file  Occupational History  . Occupation: retired from [employer]  Social Needs  . Financial resource strain: Not on file  . Food insecurity:    Worry: Not on file    Inability: Not on file  . Transportation needs:    Medical: Not on file    Non-medical: Not on file  Tobacco Use  . Smoking status: Current Some Day Smoker    Types: Cigars  . Smokeless tobacco: Never Used  . Tobacco comment: 2 cigars a month  Substance and Sexual Activity  . Alcohol use: Yes    Alcohol/week: 2.0 standard drinks    Types: 2 Cans of beer per week  . Drug use: No  . Sexual activity: Yes  Lifestyle  . Physical activity:    Days per week: Not on file    Minutes per session: Not on file  . Stress: Not on file  Relationships  . Social connections:   Talks on phone: Not on file    Gets together: Not on file    Attends religious service: Not on file    Active member of club or organization: Not on file    Attends meetings of clubs or organizations: Not on file    Relationship status: Not on file  Other Topics Concern  . Not on file  Social History Narrative  . Not on file   Allergies  Allergen Reactions  . Aspirin     REACTION: gi   Family History  Problem Relation Age of Onset  . Liver disease Father   . Cancer Father        pancreas ?  Marland Kitchen Cancer - Other Unknown        intestinal cancer      Current Outpatient Medications (Respiratory):  .  azelastine (ASTELIN) 0.1 % nasal spray, Use two sprays in each nostril twice daily as needed for runny nose and sneezing. .  budesonide (RHINOCORT ALLERGY) 32 MCG/ACT nasal spray, Place 1 spray into both nostrils as needed. Reported on 02/26/2015 .  montelukast (SINGULAIR) 10 MG tablet, Take 1  tablet (10 mg total) by mouth at bedtime.  Current Outpatient Medications (Analgesics):  Marland Kitchen  Acetaminophen (APAP 500 PO), Take 1 tablet by mouth 2 (two) times daily. Reported on 02/26/2015   Current Outpatient Medications (Other):  Marland Kitchen  Cholecalciferol 1000 UNITS tablet, Take 1,000 Units by mouth daily.   .  Omega-3 Fatty Acids (FISH OIL) 1000 MG CAPS, Take 2 capsules by mouth daily.     Past medical history, social, surgical and family history all reviewed in electronic medical record.  No pertanent information unless stated regarding to the chief complaint.   Review of Systems:  No headache, visual changes, nausea, vomiting, diarrhea, constipation, dizziness, abdominal pain, skin rash, fevers, chills, night sweats, weight loss, swollen lymph nodes, body aches, joint swelling, muscle aches, chest pain, shortness of breath, mood changes.   Objective  Blood pressure 116/60, pulse 87, height 5\' 6"  (1.676 m), weight 186 lb (84.4 kg), SpO2 97 %.   General: No apparent distress alert and oriented  x3 mood and affect normal, dressed appropriately.  HEENT: Pupils equal, extraocular movements intact  Respiratory: Patient's speak in full sentences and does not appear short of breath  Cardiovascular: No lower extremity edema, non tender, no erythema  Skin: Warm dry intact with no signs of infection or rash on extremities or on axial skeleton.  Abdomen: Soft nontender  Neuro: Cranial nerves II through XII are intact, neurovascularly intact in all extremities with 2+ DTRs and 2+ pulses.  Lymph: No lymphadenopathy of posterior or anterior cervical chain or axillae bilaterally.  Gait normal with good balance and coordination.  MSK:  Non tender with full range of motion and good stability and symmetric strength and tone of shoulders, elbows, wrist, knee and ankles bilaterally.  Very mild arthritic changes of multiple joints  Left hip exam shows near full range of motion with some mild decrease in internal range of motion of 5 degrees.  Mild positive increase in tenderness over the lateral aspect of the hip from previous exam to palpation and tightness of the piriformis.  Neurovascularly intact distally.  Contralateral hip unremarkable   Procedure: Real-time Ultrasound Guided Injection of left  greater trochanteric bursitis secondary to patient's body habitus Device: GE Logiq Q7  Ultrasound guided injection is preferred based studies that show increased duration, increased effect, greater accuracy, decreased procedural pain, increased response rate, and decreased cost with ultrasound guided versus blind injection.  Verbal informed consent obtained.  Time-out conducted.  Noted no overlying erythema, induration, or other signs of local infection.  Skin prepped in a sterile fashion.  Local anesthesia: Topical Ethyl chloride.  With sterile technique and under real time ultrasound guidance:  Greater trochanteric area was visualized and patient's bursa was noted. A 22-gauge 3 inch needle was inserted and  4 cc of 0.5% Marcaine and 1 cc of Kenalog 40 mg/dL was injected. Pictures taken Completed without difficulty  Pain immediately resolved suggesting accurate placement of the medication.  Advised to call if fevers/chills, erythema, induration, drainage, or persistent bleeding.  Images permanently stored and available for review in the ultrasound unit.  Impression: Technically successful ultrasound guided injection.    Impression and Recommendations:     This case required medical decision making of moderate complexity. The above documentation has been reviewed and is accurate and complete Lyndal Pulley, DO       Note: This dictation was prepared with Dragon dictation along with smaller phrase technology. Any transcriptional errors that result from this process are unintentional.

## 2017-09-20 ENCOUNTER — Ambulatory Visit (INDEPENDENT_AMBULATORY_CARE_PROVIDER_SITE_OTHER): Payer: Medicare HMO | Admitting: Family Medicine

## 2017-09-20 ENCOUNTER — Encounter: Payer: Self-pay | Admitting: Family Medicine

## 2017-09-20 ENCOUNTER — Ambulatory Visit: Payer: Medicare HMO | Admitting: Internal Medicine

## 2017-09-20 ENCOUNTER — Ambulatory Visit: Payer: Self-pay

## 2017-09-20 VITALS — BP 116/60 | HR 87 | Ht 66.0 in | Wt 186.0 lb

## 2017-09-20 DIAGNOSIS — M25552 Pain in left hip: Secondary | ICD-10-CM | POA: Diagnosis not present

## 2017-09-20 DIAGNOSIS — M7062 Trochanteric bursitis, left hip: Secondary | ICD-10-CM

## 2017-09-20 NOTE — Patient Instructions (Signed)
Good to see you  Ice is your friend  Keep up with the exercises Injected the hip  pennsaid pinkie amount topically 2 times daily as needed.   See me again in 4-6 weeks

## 2017-09-20 NOTE — Assessment & Plan Note (Signed)
Patient given injection.  Discussed icing regimen and home exercises.  Discussed topical anti-inflammatories.  Patient will be leaving town and traveling so unable to do physical therapy.  Discussed icing regimen.  Follow-up again 4 weeks

## 2017-09-23 DIAGNOSIS — Z8546 Personal history of malignant neoplasm of prostate: Secondary | ICD-10-CM | POA: Diagnosis not present

## 2017-10-21 DIAGNOSIS — H524 Presbyopia: Secondary | ICD-10-CM | POA: Diagnosis not present

## 2017-10-25 ENCOUNTER — Ambulatory Visit: Payer: Medicare HMO | Admitting: Family Medicine

## 2017-10-26 DIAGNOSIS — H01021 Squamous blepharitis right upper eyelid: Secondary | ICD-10-CM | POA: Diagnosis not present

## 2017-11-15 ENCOUNTER — Ambulatory Visit (INDEPENDENT_AMBULATORY_CARE_PROVIDER_SITE_OTHER): Payer: Medicare HMO | Admitting: Internal Medicine

## 2017-11-15 ENCOUNTER — Encounter: Payer: Self-pay | Admitting: Internal Medicine

## 2017-11-15 ENCOUNTER — Other Ambulatory Visit (INDEPENDENT_AMBULATORY_CARE_PROVIDER_SITE_OTHER): Payer: Medicare HMO

## 2017-11-15 VITALS — BP 128/76 | HR 69 | Temp 98.0°F | Ht 66.0 in | Wt 183.0 lb

## 2017-11-15 DIAGNOSIS — E785 Hyperlipidemia, unspecified: Secondary | ICD-10-CM

## 2017-11-15 DIAGNOSIS — Z0000 Encounter for general adult medical examination without abnormal findings: Secondary | ICD-10-CM | POA: Diagnosis not present

## 2017-11-15 DIAGNOSIS — Z8546 Personal history of malignant neoplasm of prostate: Secondary | ICD-10-CM

## 2017-11-15 DIAGNOSIS — Z23 Encounter for immunization: Secondary | ICD-10-CM

## 2017-11-15 LAB — BASIC METABOLIC PANEL
BUN: 20 mg/dL (ref 6–23)
CALCIUM: 9.2 mg/dL (ref 8.4–10.5)
CO2: 27 meq/L (ref 19–32)
Chloride: 105 mEq/L (ref 96–112)
Creatinine, Ser: 0.9 mg/dL (ref 0.40–1.50)
GFR: 86.84 mL/min (ref >60.00)
GLUCOSE: 102 mg/dL — AB (ref 70–99)
POTASSIUM: 4.2 meq/L (ref 3.5–5.1)
SODIUM: 140 meq/L (ref 135–145)

## 2017-11-15 LAB — LIPID PANEL
Cholesterol: 156 mg/dL (ref 0–200)
HDL: 42.9 mg/dL (ref >39.00)
LDL Cholesterol: 100 mg/dL — ABNORMAL HIGH (ref 0–99)
NonHDL: 113.05
TRIGLYCERIDES: 65 mg/dL (ref 0.0–149.0)
Total CHOL/HDL Ratio: 4
VLDL: 13 mg/dL (ref 0.0–40.0)

## 2017-11-15 LAB — URINALYSIS
Bilirubin Urine: NEGATIVE
Ketones, ur: NEGATIVE
LEUKOCYTES UA: NEGATIVE
NITRITE: NEGATIVE
PH: 5.5 (ref 5.0–8.0)
Specific Gravity, Urine: 1.025 (ref 1.000–1.030)
Total Protein, Urine: NEGATIVE
UROBILINOGEN UA: 1 (ref 0.0–1.0)
Urine Glucose: NEGATIVE

## 2017-11-15 LAB — CBC WITH DIFFERENTIAL/PLATELET
BASOS ABS: 0 10*3/uL (ref 0.0–0.1)
Basophils Relative: 0.4 % (ref 0.0–3.0)
EOS ABS: 0.1 10*3/uL (ref 0.0–0.7)
Eosinophils Relative: 2.6 % (ref 0.0–5.0)
HCT: 47.6 % (ref 39.0–52.0)
Hemoglobin: 16.4 g/dL (ref 13.0–17.0)
LYMPHS ABS: 2.1 10*3/uL (ref 0.7–4.0)
Lymphocytes Relative: 39 % (ref 12.0–46.0)
MCHC: 34.4 g/dL (ref 30.0–36.0)
MCV: 101.6 fl — AB (ref 78.0–100.0)
MONO ABS: 0.5 10*3/uL (ref 0.1–1.0)
MONOS PCT: 10.1 % (ref 3.0–12.0)
NEUTROS PCT: 47.9 % (ref 43.0–77.0)
Neutro Abs: 2.6 10*3/uL (ref 1.4–7.7)
PLATELETS: 202 10*3/uL (ref 150.0–400.0)
RBC: 4.68 Mil/uL (ref 4.22–5.81)
RDW: 13.2 % (ref 11.5–15.5)
WBC: 5.4 10*3/uL (ref 4.0–10.5)

## 2017-11-15 LAB — HEPATIC FUNCTION PANEL
ALBUMIN: 4.2 g/dL (ref 3.5–5.2)
ALT: 22 U/L (ref 0–53)
AST: 22 U/L (ref 0–37)
Alkaline Phosphatase: 63 U/L (ref 39–117)
BILIRUBIN TOTAL: 0.8 mg/dL (ref 0.2–1.2)
Bilirubin, Direct: 0.1 mg/dL (ref 0.0–0.3)
Total Protein: 7.2 g/dL (ref 6.0–8.3)

## 2017-11-15 LAB — TSH: TSH: 1.07 u[IU]/mL (ref 0.35–4.50)

## 2017-11-15 MED ORDER — ZOSTER VAC RECOMB ADJUVANTED 50 MCG/0.5ML IM SUSR: 0.5000 mL | Freq: Once | INTRAMUSCULAR | 1 refills | Status: AC

## 2017-11-15 NOTE — Assessment & Plan Note (Signed)
Here for medicare wellness/physical  Diet: heart healthy  Physical activity: not sedentary - gym, golf Depression/mood screen: negative  Hearing: intact to whispered voice  Visual acuity: grossly normal, performs annual eye exam  ADLs: capable  Fall risk: none  Home safety: good  Cognitive evaluation: intact to orientation, naming, recall and repetition  EOL planning: adv directives, full code/ I agree  I have personally reviewed and have noted  1. The patient's medical and social history  2. Their use of alcohol, tobacco or illicit drugs  3. Their current medications and supplements  4. The patient's functional ability including ADL's, fall risks, home safety risks and hearing or visual impairment.  5. Diet and physical activities  6. Evidence for depression or mood disorders 7. Providers roster was reviewed   The patient is here for annual Medicare wellness examination and management of other chronic and acute problems.   The risk factors are reflected in the social history.  The roster of all physicians providing medical care to patient - is listed in the Snapshot section of the chart.  Activities of daily living:  The patient is 100% inedpendent in all ADLs: dressing, toileting, feeding as well as independent mobility  Home safety : good. Using seatbelts. There is no violence in the home.   There is no risks for hepatitis, STDs or HIV. There is no history of blood transfusion. They have no travel history to infectious disease endemic areas of the world.  The patient has  seen their dentist in the last 12 month. They have  seen their eye doctor in the last year. They deny  Any major hearing difficulty and have not had audiologic testing in the last year.  They do not  have excessive sun exposure. Discussed the need for sun protection: hats, long sleeves and use of sunscreen if there is significant sun exposure.   Diet: the importance of a healthy diet is discussed. They do have  a reasonably healthy  diet.  The patient has a fairly regular exercise program of a mixed nature: walking, yard work, etc.The benefits of regular aerobic exercise were discussed.  Depression screen: there are no signs or vegative symptoms of depression- irritability, change in appetite, anhedonia, sadness/tearfullness.  Cognitive assessment: the patient manages all their financial and personal affairs and is actively engaged. They could relate day,date,year and events; recalled 3/3 objects at 3 minutes  The following portions of the patient's history were reviewed and updated as appropriate: allergies, current medications, past family history, past medical history,  past surgical history, past social history  and problem list.  Vision, hearing, body mass index were assessed and reviewed.   During the course of the visit the patient was educated and counseled about appropriate screening and preventive services including : fall prevention , diabetes screening, nutrition counseling, colorectal cancer screening, and recommended immunizations. shingrix Rx Colon 2018

## 2017-11-15 NOTE — Progress Notes (Signed)
Subjective:  Patient ID: Jeremy [name], male    DOB: 1940/06/30  Age: 77 y.o. MRN: [id number]  CC: No chief complaint on file.   HPI Jeremy [name] presents for a well exam.  His wife dx'd w/lymphoma 2019, had chemo  Outpatient Medications Prior to Visit  Medication Sig Dispense Refill  . Acetaminophen (APAP 500 PO) Take 1 tablet by mouth 2 (two) times daily. Reported on 02/26/2015    . azelastine (ASTELIN) 0.1 % nasal spray Use two sprays in each nostril twice daily as needed for runny nose and sneezing. 30 mL 5  . budesonide (RHINOCORT ALLERGY) 32 MCG/ACT nasal spray Place 1 spray into both nostrils as needed. Reported on 02/26/2015    . Cholecalciferol 1000 UNITS tablet Take 1,000 Units by mouth daily.      . montelukast (SINGULAIR) 10 MG tablet Take 1 tablet (10 mg total) by mouth at bedtime. 30 tablet 5  . Omega-3 Fatty Acids (FISH OIL) 1000 MG CAPS Take 2 capsules by mouth daily.      No facility-administered medications prior to visit.     ROS: Review of Systems  Constitutional: Negative for appetite change, fatigue and unexpected weight change.  HENT: Negative for congestion, nosebleeds, sneezing, sore throat and trouble swallowing.   Eyes: Negative for itching and visual disturbance.  Respiratory: Negative for cough.   Cardiovascular: Negative for chest pain, palpitations and leg swelling.  Gastrointestinal: Negative for abdominal distention, blood in stool, diarrhea and nausea.  Genitourinary: Negative for frequency and hematuria.  Musculoskeletal: Negative for back pain, gait problem, joint swelling and neck pain.  Skin: Negative for rash.  Neurological: Negative for dizziness, tremors, speech difficulty and weakness.  Psychiatric/Behavioral: Negative for agitation, dysphoric mood, sleep disturbance and suicidal ideas. The patient is not nervous/anxious.     Objective:  BP 128/76 (BP Location: Left Arm, Patient Position: Sitting, Cuff Size: Normal)   Pulse 69    Temp 98 F (36.7 C) (Oral)   Ht 5\' 6"  (1.676 m)   Wt 183 lb (83 kg)   SpO2 96%   BMI 29.54 kg/m   BP Readings from Last 3 Encounters:  11/15/17 128/76  09/20/17 116/60  08/16/17 132/78    Wt Readings from Last 3 Encounters:  11/15/17 183 lb (83 kg)  09/20/17 186 lb (84.4 kg)  08/16/17 189 lb (85.7 kg)    Physical Exam  Constitutional: He is oriented to person, place, and time. He appears well-developed. No distress.  NAD  HENT:  Mouth/Throat: Oropharynx is clear and moist.  Eyes: Pupils are equal, round, and reactive to light. Conjunctivae are normal.  Neck: Normal range of motion. No JVD present. No thyromegaly present.  Cardiovascular: Normal rate, regular rhythm, normal heart sounds and intact distal pulses. Exam reveals no gallop and no friction rub.  No murmur heard. Pulmonary/Chest: Effort normal and breath sounds normal. No respiratory distress. He has no wheezes. He has no rales. He exhibits no tenderness.  Abdominal: Soft. Bowel sounds are normal. He exhibits no distension and no mass. There is no tenderness. There is no rebound and no guarding.  Musculoskeletal: Normal range of motion. He exhibits no edema or tenderness.  Lymphadenopathy:    He has no cervical adenopathy.  Neurological: He is alert and oriented to person, place, and time. He has normal reflexes. No cranial nerve deficit. He exhibits normal muscle tone. He displays a negative Romberg sign. Coordination and gait normal.  Skin: Skin is warm and dry. No rash  noted.  Psychiatric: He has a normal mood and affect. His behavior is normal. Judgment and thought content normal.    Lab Results  Component Value Date   WBC 4.4 11/12/2016   HGB 15.1 11/12/2016   HCT 44.7 11/12/2016   PLT 201.0 11/12/2016   GLUCOSE 97 11/12/2016   CHOL 146 11/12/2016   TRIG 77.0 11/12/2016   HDL 40.10 11/12/2016   LDLCALC 90 11/12/2016   ALT 19 11/12/2016   AST 19 11/12/2016   NA 141 11/12/2016   K 4.5 11/12/2016    CL 107 11/12/2016   CREATININE 0.80 11/12/2016   BUN 17 11/12/2016   CO2 28 11/12/2016   TSH 1.43 11/12/2016   PSA 0.01 (L) 10/20/2013   INR 1.0 ratio 03/27/2008    [place] Limited Joint Space Structures Low Left  Result Date: 08/18/2017 No images  Dg Hip Unilat With Pelvis 2-3 Views Left  Result Date: 08/16/2017 CLINICAL DATA:  Three weeks of left hip pain. Clinical diagnosis of bursitis today. EXAM: DG HIP (WITH OR WITHOUT PELVIS) 2-3V LEFT COMPARISON:  AP pelvis of September 22, 2010 FINDINGS: The bones are subjectively adequately mineralized. The joint space is well maintained. The articular surfaces of the left femoral head and acetabulum remains smoothly rounded. The femoral neck, intertrochanteric, and subtrochanteric regions are normal. IMPRESSION: There is no acute or significant chronic bony abnormality of the left hip. Electronically Signed   By: David  Martinique M.D.   On: 08/16/2017 16:41    Assessment & Plan:   There are no diagnoses linked to this encounter.   No orders of the defined types were placed in this encounter.    Follow-up: No follow-ups on file.  Walker Kehr, MD

## 2017-11-15 NOTE — Assessment & Plan Note (Signed)
PSA this year was ok

## 2017-11-15 NOTE — Patient Instructions (Addendum)
Cardiac CT calcium scoring test $150   Computed tomography, more commonly known as a CT or CAT scan, is a diagnostic medical imaging test. Like traditional x-rays, it produces multiple images or pictures of the inside of the body. The cross-sectional images generated during a CT scan can be reformatted in multiple planes. They can even generate three-dimensional images. These images can be viewed on a computer monitor, printed on film or by a 3D printer, or transferred to a CD or DVD. CT images of internal organs, bones, soft tissue and blood vessels provide greater detail than traditional x-rays, particularly of soft tissues and blood vessels. A cardiac CT scan for coronary calcium is a non-invasive way of obtaining information about the presence, location and extent of calcified plaque in the coronary arteries-the vessels that supply oxygen-containing blood to the heart muscle. Calcified plaque results when there is a build-up of fat and other substances under the inner layer of the artery. This material can calcify which signals the presence of atherosclerosis, a disease of the vessel wall, also called coronary artery disease (CAD). People with this disease have an increased risk for heart attacks. In addition, over time, progression of plaque build up (CAD) can narrow the arteries or even close off blood flow to the heart. The result may be chest pain, sometimes called "angina," or a heart attack. Because calcium is a marker of CAD, the amount of calcium detected on a cardiac CT scan is a helpful prognostic tool. The findings on cardiac CT are expressed as a calcium score. Another name for this test is coronary artery calcium scoring.  What are some common uses of the procedure? The goal of cardiac CT scan for calcium scoring is to determine if CAD is present and to what extent, even if there are no symptoms. It is a screening study that may be recommended by a physician for patients with risk factors  for CAD but no clinical symptoms. The major risk factors for CAD are: . high blood cholesterol levels  . family history of heart attacks  . diabetes  . high blood pressure  . cigarette smoking  . overweight or obese  . physical inactivity   A negative cardiac CT scan for calcium scoring shows no calcification within the coronary arteries. This suggests that CAD is absent or so minimal it cannot be seen by this technique. The chance of having a heart attack over the next two to five years is very low under these circumstances. A positive test means that CAD is present, regardless of whether or not the patient is experiencing any symptoms. The amount of calcification-expressed as the calcium score-may help to predict the likelihood of a myocardial infarction (heart attack) in the coming years and helps your medical doctor or cardiologist decide whether the patient may need to take preventive medicine or undertake other measures such as diet and exercise to lower the risk for heart attack. The extent of CAD is graded according to your calcium score:  Calcium Score  Presence of CAD  0 No evidence of CAD   1-10 Minimal evidence of CAD  11-100 Mild evidence of CAD  101-400 Moderate evidence of CAD  Over 400 Extensive evidence of CAD    Health Maintenance, Male A healthy lifestyle and preventive care is important for your health and wellness. Ask your health care provider about what schedule of regular examinations is right for you. What should I know about weight and diet? Eat a Healthy Diet  Eat plenty of vegetables, fruits, whole grains, low-fat dairy products, and lean protein.  Do not eat a lot of foods high in solid fats, added sugars, or salt.  Maintain a Healthy Weight Regular exercise can help you achieve or maintain a healthy weight. You should:  Do at least 150 minutes of exercise each week. The exercise should increase your heart rate and make you sweat (moderate-intensity  exercise).  Do strength-training exercises at least twice a week.  Watch Your Levels of Cholesterol and Blood Lipids  Have your blood tested for lipids and cholesterol every 5 years starting at 77 years of age. If you are at high risk for heart disease, you should start having your blood tested when you are 77 years old. You may need to have your cholesterol levels checked more often if: ? Your lipid or cholesterol levels are high. ? You are older than 77 years of age. ? You are at high risk for heart disease.  What should I know about cancer screening? Many types of cancers can be detected early and may often be prevented. Lung Cancer  You should be screened every year for lung cancer if: ? You are a current smoker who has smoked for at least 30 years. ? You are a former smoker who has quit within the past 15 years.  Talk to your health care provider about your screening options, when you should start screening, and how often you should be screened.  Colorectal Cancer  Routine colorectal cancer screening usually begins at 77 years of age and should be repeated every 5-10 years until you are 77 years old. You may need to be screened more often if early forms of precancerous polyps or small growths are found. Your health care provider may recommend screening at an earlier age if you have risk factors for colon cancer.  Your health care provider may recommend using home test kits to check for hidden blood in the stool.  A small camera at the end of a tube can be used to examine your colon (sigmoidoscopy or colonoscopy). This checks for the earliest forms of colorectal cancer.  Prostate and Testicular Cancer  Depending on your age and overall health, your health care provider may do certain tests to screen for prostate and testicular cancer.  Talk to your health care provider about any symptoms or concerns you have about testicular or prostate cancer.  Skin Cancer  Check your skin  from head to toe regularly.  Tell your health care provider about any new moles or changes in moles, especially if: ? There is a change in a mole's size, shape, or color. ? You have a mole that is larger than a pencil eraser.  Always use sunscreen. Apply sunscreen liberally and repeat throughout the day.  Protect yourself by wearing long sleeves, pants, a wide-brimmed hat, and sunglasses when outside.  What should I know about heart disease, diabetes, and high blood pressure?  If you are 45-46 years of age, have your blood pressure checked every 3-5 years. If you are 46 years of age or older, have your blood pressure checked every year. You should have your blood pressure measured twice-once when you are at a hospital or clinic, and once when you are not at a hospital or clinic. Record the average of the two measurements. To check your blood pressure when you are not at a hospital or clinic, you can use: ? An automated blood pressure machine at a pharmacy. ? A  home blood pressure monitor.  Talk to your health care provider about your target blood pressure.  If you are between 69-79 years old, ask your health care provider if you should take aspirin to prevent heart disease.  Have regular diabetes screenings by checking your fasting blood sugar level. ? If you are at a normal weight and have a low risk for diabetes, have this test once every three years after the age of 68. ? If you are overweight and have a high risk for diabetes, consider being tested at a younger age or more often.  A one-time screening for abdominal aortic aneurysm (AAA) by ultrasound is recommended for men aged 34-75 years who are current or former smokers. What should I know about preventing infection? Hepatitis B If you have a higher risk for hepatitis B, you should be screened for this virus. Talk with your health care provider to find out if you are at risk for hepatitis B infection. Hepatitis C Blood testing is  recommended for:  Everyone born from [birth year] through 1965.  Anyone with known risk factors for hepatitis C.  Sexually Transmitted Diseases (STDs)  You should be screened each year for STDs including gonorrhea and chlamydia if: ? You are sexually active and are younger than 77 years of age. ? You are older than 77 years of age and your health care provider tells you that you are at risk for this type of infection. ? Your sexual activity has changed since you were last screened and you are at an increased risk for chlamydia or gonorrhea. Ask your health care provider if you are at risk.  Talk with your health care provider about whether you are at high risk of being infected with HIV. Your health care provider may recommend a prescription medicine to help prevent HIV infection.  What else can I do?  Schedule regular health, dental, and eye exams.  Stay current with your vaccines (immunizations).  Do not use any tobacco products, such as cigarettes, chewing tobacco, and e-cigarettes. If you need help quitting, ask your health care provider.  Limit alcohol intake to no more than 2 drinks per day. One drink equals 12 ounces of beer, 5 ounces of wine, or 1 ounces of hard liquor.  Do not use street drugs.  Do not share needles.  Ask your health care provider for help if you need support or information about quitting drugs.  Tell your health care provider if you often feel depressed.  Tell your health care provider if you have ever been abused or do not feel safe at home. This information is not intended to replace advice given to you by your health care provider. Make sure you discuss any questions you have with your health care provider. Document Released: 06/27/2007 Document Revised: 08/28/2015 Document Reviewed: 10/02/2014 Elsevier Interactive Patient Education  [employer].

## 2017-11-15 NOTE — Addendum Note (Signed)
Addended by: Karren Cobble on: 11/15/2017 09:52 AM   Modules accepted: Orders

## 2017-11-15 NOTE — Assessment & Plan Note (Signed)
CT ca scoring info 

## 2017-12-03 ENCOUNTER — Ambulatory Visit (INDEPENDENT_AMBULATORY_CARE_PROVIDER_SITE_OTHER)
Admission: RE | Admit: 2017-12-03 | Discharge: 2017-12-03 | Disposition: A | Discharge status: Home / Self Care | Payer: Self-pay | Source: Ambulatory Visit | Attending: Internal Medicine | Admitting: Internal Medicine

## 2017-12-03 DIAGNOSIS — E785 Hyperlipidemia, unspecified: Secondary | ICD-10-CM

## 2017-12-21 ENCOUNTER — Encounter: Payer: Self-pay | Admitting: Internal Medicine

## 2017-12-21 ENCOUNTER — Ambulatory Visit (INDEPENDENT_AMBULATORY_CARE_PROVIDER_SITE_OTHER): Payer: Medicare HMO | Admitting: Internal Medicine

## 2017-12-21 DIAGNOSIS — R030 Elevated blood-pressure reading, without diagnosis of hypertension: Secondary | ICD-10-CM

## 2017-12-21 DIAGNOSIS — E785 Hyperlipidemia, unspecified: Secondary | ICD-10-CM

## 2017-12-21 DIAGNOSIS — J069 Acute upper respiratory infection, unspecified: Secondary | ICD-10-CM | POA: Insufficient documentation

## 2017-12-21 MED ORDER — AZITHROMYCIN 250 MG PO TABS: ORAL_TABLET | ORAL | 0 refills | Status: DC

## 2017-12-21 NOTE — Assessment & Plan Note (Signed)
BP Readings from Last 3 Encounters:  12/21/17 136/80  11/15/17 128/76  09/20/17 116/60

## 2017-12-21 NOTE — Assessment & Plan Note (Signed)
CT ca scoring nl "0" score, no CAD Discussed

## 2017-12-21 NOTE — Assessment & Plan Note (Signed)
Zpac OTC meds

## 2017-12-21 NOTE — Progress Notes (Signed)
Subjective:  Patient ID: Jeremy [name], male    DOB: Aug 19, 1940  Age: 77 y.o. MRN: [id number]  CC: No chief complaint on file.   HPI Jeremy [name] presents for URI sx's x 3 d Lost voice F/u dyslipidemia, need to discuss CT  Outpatient Medications Prior to Visit  Medication Sig Dispense Refill  . Acetaminophen (APAP 500 PO) Take 1 tablet by mouth 2 (two) times daily. Reported on 02/26/2015    . azelastine (ASTELIN) 0.1 % nasal spray Use two sprays in each nostril twice daily as needed for runny nose and sneezing. 30 mL 5  . budesonide (RHINOCORT ALLERGY) 32 MCG/ACT nasal spray Place 1 spray into both nostrils as needed. Reported on 02/26/2015    . Cholecalciferol 1000 UNITS tablet Take 1,000 Units by mouth daily.      . montelukast (SINGULAIR) 10 MG tablet Take 1 tablet (10 mg total) by mouth at bedtime. 30 tablet 5  . Omega-3 Fatty Acids (FISH OIL) 1000 MG CAPS Take 2 capsules by mouth daily.      No facility-administered medications prior to visit.     ROS: Review of Systems  Constitutional: Negative for appetite change, fatigue, fever and unexpected weight change.  HENT: Positive for congestion, postnasal drip, sinus pressure, sinus pain and voice change. Negative for nosebleeds, sneezing, sore throat and trouble swallowing.   Eyes: Negative for itching and visual disturbance.  Respiratory: Positive for cough. Negative for shortness of breath and wheezing.   Cardiovascular: Negative for chest pain, palpitations and leg swelling.  Gastrointestinal: Negative for abdominal distention, blood in stool, diarrhea and nausea.  Genitourinary: Negative for frequency and hematuria.  Musculoskeletal: Negative for back pain, gait problem, joint swelling and neck pain.  Skin: Negative for rash.  Neurological: Negative for dizziness, tremors, speech difficulty and weakness.  Psychiatric/Behavioral: Negative for agitation, dysphoric mood and sleep disturbance. The patient is not  nervous/anxious.     Objective:  BP 136/80 (BP Location: Left Arm, Patient Position: Sitting, Cuff Size: Normal)   Pulse 88   Temp 98.1 F (36.7 C) (Oral)   Ht 5\' 6"  (1.676 m)   Wt 188 lb (85.3 kg)   SpO2 97%   BMI 30.34 kg/m   BP Readings from Last 3 Encounters:  12/21/17 136/80  11/15/17 128/76  09/20/17 116/60    Wt Readings from Last 3 Encounters:  12/21/17 188 lb (85.3 kg)  11/15/17 183 lb (83 kg)  09/20/17 186 lb (84.4 kg)    Physical Exam  Constitutional: He is oriented to person, place, and time. He appears well-developed. No distress.  NAD  HENT:  Mouth/Throat: Oropharynx is clear and moist.  Eyes: Pupils are equal, round, and reactive to light. Conjunctivae are normal.  Neck: Normal range of motion. No JVD present. No thyromegaly present.  Cardiovascular: Normal rate, regular rhythm, normal heart sounds and intact distal pulses. Exam reveals no gallop and no friction rub.  No murmur heard. Pulmonary/Chest: Effort normal and breath sounds normal. No respiratory distress. He has no wheezes. He has no rales. He exhibits no tenderness.  Abdominal: Soft. Bowel sounds are normal. He exhibits no distension and no mass. There is no tenderness. There is no rebound and no guarding.  Musculoskeletal: Normal range of motion. He exhibits no edema or tenderness.  Lymphadenopathy:    He has no cervical adenopathy.  Neurological: He is alert and oriented to person, place, and time. He has normal reflexes. No cranial nerve deficit. He exhibits normal muscle tone.  He displays a negative Romberg sign. Coordination and gait normal.  Skin: Skin is warm and dry. No rash noted.  Psychiatric: He has a normal mood and affect. His behavior is normal. Judgment and thought content normal.   Hoarse eryth throat   Lab Results  Component Value Date   WBC 5.4 11/15/2017   HGB 16.4 11/15/2017   HCT 47.6 11/15/2017   PLT 202.0 11/15/2017   GLUCOSE 102 (H) 11/15/2017   CHOL 156  11/15/2017   TRIG 65.0 11/15/2017   HDL 42.90 11/15/2017   LDLCALC 100 (H) 11/15/2017   ALT 22 11/15/2017   AST 22 11/15/2017   NA 140 11/15/2017   K 4.2 11/15/2017   CL 105 11/15/2017   CREATININE 0.90 11/15/2017   BUN 20 11/15/2017   CO2 27 11/15/2017   TSH 1.07 11/15/2017   PSA 0.01 (L) 10/20/2013   INR 1.0 ratio 03/27/2008    Ct Cardiac Scoring  Addendum Date: 12/06/2017   ADDENDUM REPORT: 12/06/2017 11:42 CLINICAL DATA:  Risk stratification EXAM: Coronary Calcium Score TECHNIQUE: The patient was scanned on a Siemens Somatom 64 slice scanner. Axial non-contrast 3 mm slices were carried out through the heart. The data set was analyzed on a dedicated work station and scored using the [place]. FINDINGS: Non-cardiac: See separate report from [facility]. Ascending aorta: Normal diameter 3.3 cm Pericardium: Normal Coronary arteries: No calcium noted IMPRESSION: Coronary calcium score of 0. Jenkins Rouge Electronically Signed   By: Jenkins Rouge M.D.   On: 12/06/2017 11:42   Result Date: 12/06/2017 EXAM: OVER-READ INTERPRETATION  CT CHEST The following report is an over-read performed by radiologist Dr. Rolm Baptise of [facility] on 12/03/2017. This over-read does not include interpretation of cardiac or coronary anatomy or pathology. The coronary calcium score interpretation by the cardiologist is attached. COMPARISON:  None. FINDINGS: Vascular: Heart is normal size.  Visualized aorta is normal caliber. Mediastinum/Nodes: No adenopathy in the lower mediastinum or hila. Lungs/Pleura: Visualized lungs clear.  No effusions. Upper Abdomen: Imaging into the upper abdomen shows no acute findings. Musculoskeletal: Chest wall soft tissues are unremarkable. No acute bony abnormality. IMPRESSION: No acute or significant extracardiac abnormality. Electronically Signed: By: Rolm Baptise M.D. On: 12/03/2017 11:50    Assessment & Plan:   There are no diagnoses linked to this  encounter.   No orders of the defined types were placed in this encounter.    Follow-up: No follow-ups on file.  Walker Kehr, MD

## 2017-12-21 NOTE — Patient Instructions (Addendum)
You can use over-the-counter  "cold" medicines  such as "Tylenol cold" , "Advil cold",  "Mucinex" or" Mucinex D"  for cough and congestion.   Avoid decongestants if you have high blood pressure and use "Afrin" nasal spray for nasal congestion as directed. Use " Delsym" or" Robitussin" cough syrup varietis for cough.  You can use plain "Tylenol"  for fever, chills and achyness. Use Halls or Ricola cough drops.    Please, make an appointment if you are not better or if you're worse.   VOICE REST

## 2018-01-03 DIAGNOSIS — Z85828 Personal history of other malignant neoplasm of skin: Secondary | ICD-10-CM | POA: Diagnosis not present

## 2018-01-03 DIAGNOSIS — L814 Other melanin hyperpigmentation: Secondary | ICD-10-CM | POA: Diagnosis not present

## 2018-01-03 DIAGNOSIS — L570 Actinic keratosis: Secondary | ICD-10-CM | POA: Diagnosis not present

## 2018-01-03 DIAGNOSIS — L821 Other seborrheic keratosis: Secondary | ICD-10-CM | POA: Diagnosis not present

## 2018-01-18 ENCOUNTER — Encounter: Payer: Self-pay | Admitting: Allergy and Immunology

## 2018-01-18 ENCOUNTER — Ambulatory Visit: Payer: Medicare HMO | Admitting: Allergy and Immunology

## 2018-01-18 VITALS — BP 132/76 | HR 60 | Resp 16

## 2018-01-18 DIAGNOSIS — J310 Chronic rhinitis: Secondary | ICD-10-CM

## 2018-01-18 DIAGNOSIS — J3089 Other allergic rhinitis: Secondary | ICD-10-CM

## 2018-01-18 DIAGNOSIS — T485X5A Adverse effect of other anti-common-cold drugs, initial encounter: Secondary | ICD-10-CM

## 2018-01-18 MED ORDER — AZELASTINE HCL 0.1 % NA SOLN: NASAL | 5 refills | Status: DC

## 2018-01-18 MED ORDER — METHYLPREDNISOLONE ACETATE 80 MG/ML IJ SUSP
80.0000 mg | Freq: Once | INTRAMUSCULAR | Status: AC
  Administered 2018-01-18: 80 mg via INTRAMUSCULAR

## 2018-01-18 MED ORDER — MONTELUKAST SODIUM 10 MG PO TABS: 10.0000 mg | ORAL_TABLET | Freq: Every day | ORAL | 11 refills | Status: DC

## 2018-01-18 NOTE — Progress Notes (Signed)
Follow-up Note  Referring Provider: Cassandria Anger, MD Primary Provider: Cassandria Anger, MD Date of Office Visit: 01/18/2018  Subjective:   Jeremy [name] (DOB: 16-Oct-1940) is a 78 y.o. male who returns to the [facility] on 01/18/2018 in re-evaluation of the following:  HPI: Jeremy [name] returns to this clinic in reevaluation of allergic rhinitis.  His last visit to this clinic was 10 February 2017.  He has really done very well throughout the year and has basically come off all of his medications and does not use any nasal steroid or over-the-counter nasal decongestants or other medications.  Unfortunately, about 1 week ago he has developed very severe nasal congestion and nose blowing with clear rhinorrhea without any fever but with some decreased ability to smell and without any ugly nasal discharge.  He has been using Afrin at nighttime this week.  He also takes Claritin-D.  Allergies as of 01/18/2018      Reactions   Aspirin    REACTION: gi      Medication List      APAP 500 PO Take 1 tablet by mouth 2 (two) times daily. Reported on 02/26/2015   azelastine 0.1 % nasal spray Commonly known as:  ASTELIN Use two sprays in each nostril twice daily as needed for runny nose and sneezing.   Cholecalciferol 25 MCG (1000 UT) tablet Take 1,000 Units by mouth daily.   Fish Oil 1000 MG Caps Take 2 capsules by mouth daily.   loratadine-pseudoephedrine 10-240 MG 24 hr tablet Commonly known as:  CLARITIN-D 24-hour Take 1 tablet by mouth daily.   montelukast 10 MG tablet Commonly known as:  SINGULAIR Take 1 tablet (10 mg total) by mouth at bedtime.   oxymetazoline 0.05 % nasal spray Commonly known as:  AFRIN Place 1 spray into both nostrils 2 (two) times daily.   RHINOCORT ALLERGY 32 MCG/ACT nasal spray Generic drug:  budesonide Place 1 spray into both nostrils as needed. Reported on 02/26/2015       Past Medical History:  Diagnosis Date  . AK  (actinic keratosis)   . Allergic rhinitis   . History of colonic polyps    Dr. Ardis Hughs- Due 2018  . History of skin cancer   . Osteopenia   . Prostate cancer ([place])    hx of s/p XRT, seeds Dr. Risa Grill    Past Surgical History:  Procedure Laterality Date  . INSERTION PROSTATE RADIATION SEED    . KNEE ARTHROSCOPY Right   . KNEE SURGERY Left   . TONSILLECTOMY    . XRT  2008    Review of systems negative except as noted in HPI / PMHx or noted below:  Review of Systems  Constitutional: Negative.   HENT: Negative.   Eyes: Negative.   Respiratory: Negative.   Cardiovascular: Negative.   Gastrointestinal: Negative.   Genitourinary: Negative.   Musculoskeletal: Negative.   Skin: Negative.   Neurological: Negative.   Endo/Heme/Allergies: Negative.   Psychiatric/Behavioral: Negative.      Objective:   Vitals:   01/18/18 1528  BP: 132/76  Pulse: 60  Resp: 16          Physical Exam Constitutional:      Appearance: He is not diaphoretic.  HENT:     Head: Normocephalic.     Right Ear: Tympanic membrane, ear canal and external ear normal.     Left Ear: Tympanic membrane, ear canal and external ear normal.     Nose:  Mucosal edema present. No rhinorrhea.     Mouth/Throat:     Pharynx: Uvula midline. No oropharyngeal exudate.  Eyes:     Conjunctiva/sclera: Conjunctivae normal.  Neck:     Thyroid: No thyromegaly.     Trachea: Trachea normal. No tracheal tenderness or tracheal deviation.  Cardiovascular:     Rate and Rhythm: Normal rate and regular rhythm.     Heart sounds: Normal heart sounds, S1 normal and S2 normal. No murmur.  Pulmonary:     Effort: No respiratory distress.     Breath sounds: Normal breath sounds. No stridor. No wheezing or rales.  Lymphadenopathy:     Head:     Right side of head: No tonsillar adenopathy.     Left side of head: No tonsillar adenopathy.     Cervical: No cervical adenopathy.  Skin:    Findings: No erythema or rash.     Nails:  There is no clubbing.   Neurological:     Mental Status: He is alert.     Diagnostics:   Assessment and Plan:   1. Other allergic rhinitis   2. Rhinitis medicamentosa     1. Depo-Medrol 80 IM delivered in the clinic today  2. Rhinocort one spray each nostril one time per day  3. Montelukast 10 mg one tablet one time per day  4. If needed:   A. Astelin 2 sprays each nostril twice a day  B. over-the-counter Claritin or Zyrtec 10 mg one time per day  C. nasal saline  5. Discontinue Afrin  6. Return to clinic in 1 year or earlier if problem  Although it is not obvious exactly why [place] has had a significant flare of his nasal congestive issue recently I will treat him with a systemic steroid to eliminate inflammation and asked him to restart his topical nasal steroid and leukotriene modifier and discontinue his Afrin.  If he does well I can see him back in his clinic in 1 year or earlier if there is a problem.  Allena Katz, MD Allergy / [facility] [place]

## 2018-01-18 NOTE — Patient Instructions (Signed)
  1. Depo-Medrol 80 IM delivered in the clinic today  2. Rhinocort one spray each nostril one time per day  3. Montelukast 10 mg one tablet one time per day  4. If needed:   A. Astelin 2 sprays each nostril twice a day  B. over-the-counter Claritin or Zyrtec 10 mg one time per day  C. nasal saline  5. Discontinue Afrin  6. Return to clinic in 1 year or earlier if problem

## 2018-01-19 ENCOUNTER — Encounter: Payer: Self-pay | Admitting: Allergy and Immunology

## 2018-02-10 DIAGNOSIS — R69 Illness, unspecified: Secondary | ICD-10-CM | POA: Diagnosis not present

## 2018-07-08 ENCOUNTER — Ambulatory Visit: Payer: Medicare HMO | Admitting: Family Medicine

## 2018-07-08 ENCOUNTER — Other Ambulatory Visit: Payer: Self-pay

## 2018-07-08 ENCOUNTER — Encounter: Payer: Self-pay | Admitting: Family Medicine

## 2018-07-08 DIAGNOSIS — M1712 Unilateral primary osteoarthritis, left knee: Secondary | ICD-10-CM

## 2018-07-08 NOTE — Progress Notes (Signed)
[name] [place], [place] [address] Phone: [phone] Subjective:   [name], am serving as a scribe for Dr. Hulan Saas.   CC: Left knee pain  CWC:[id number]   09/20/2017: Patient given injection.  Discussed icing regimen and home exercises.  Discussed topical anti-inflammatories.  Patient will be leaving town and traveling so unable to do physical therapy.  Discussed icing regimen.  Follow-up again 4 weeks  Update 07/08/2018: Jeremy [name] is a 78 y.o. male coming in with complaint of left knee pain. Has had injections previously. Does note pain increased a month ago. Stepped wrong on the golf course and fell with leg flexed under him.  Patient states more instability of left knee.  Mild arthritic changes.  The medial joint line difficulty with flexion possible flexion.    Past Medical History:  Diagnosis Date  . AK (actinic keratosis)   . Allergic rhinitis   . History of colonic polyps    Dr. Ardis Hughs- Due 2018  . History of skin cancer   . Osteopenia   . Prostate cancer ([place])    hx of s/p XRT, seeds Dr. Risa Grill   Past Surgical History:  Procedure Laterality Date  . INSERTION PROSTATE RADIATION SEED    . KNEE ARTHROSCOPY Right   . KNEE SURGERY Left   . TONSILLECTOMY    . XRT  2008   Social History   Socioeconomic History  . Marital status: Married    Spouse name: Not on file  . Number of children: 2  . Years of education: Not on file  . Highest education level: Not on file  Occupational History  . Occupation: retired from [employer]  Social Needs  . Financial resource strain: Not on file  . Food insecurity    Worry: Not on file    Inability: Not on file  . Transportation needs    Medical: Not on file    Non-medical: Not on file  Tobacco Use  . Smoking status: Current Some Day Smoker    Types: Cigars  . Smokeless tobacco: Never Used  . Tobacco comment: 2 cigars a month  Substance and Sexual Activity  .  Alcohol use: Yes    Alcohol/week: 2.0 standard drinks    Types: 2 Cans of beer per week  . Drug use: No  . Sexual activity: Yes  Lifestyle  . Physical activity    Days per week: Not on file    Minutes per session: Not on file  . Stress: Not on file  Relationships  . Social Herbalist on phone: Not on file    Gets together: Not on file    Attends religious service: Not on file    Active member of club or organization: Not on file    Attends meetings of clubs or organizations: Not on file    Relationship status: Not on file  Other Topics Concern  . Not on file  Social History Narrative  . Not on file   Allergies  Allergen Reactions  . Aspirin     REACTION: gi   Family History  Problem Relation Age of Onset  . Liver disease Father   . Cancer Father        pancreas ?  Marland Kitchen Cancer - Other Unknown        intestinal cancer      Current Outpatient Medications (Respiratory):  .  azelastine (ASTELIN) 0.1 % nasal spray, Use two  sprays in each nostril twice daily as needed for runny nose and sneezing. .  budesonide (RHINOCORT ALLERGY) 32 MCG/ACT nasal spray, Place 1 spray into both nostrils as needed. Reported on 02/26/2015 .  loratadine-pseudoephedrine (CLARITIN-D 24-HOUR) 10-240 MG 24 hr tablet, Take 1 tablet by mouth daily. .  montelukast (SINGULAIR) 10 MG tablet, Take 1 tablet (10 mg total) by mouth at bedtime. Marland Kitchen  oxymetazoline (AFRIN) 0.05 % nasal spray, Place 1 spray into both nostrils 2 (two) times daily.  Current Outpatient Medications (Analgesics):  Marland Kitchen  Acetaminophen (APAP 500 PO), Take 1 tablet by mouth 2 (two) times daily. Reported on 02/26/2015   Current Outpatient Medications (Other):  Marland Kitchen  Cholecalciferol 1000 UNITS tablet, Take 1,000 Units by mouth daily.   .  Omega-3 Fatty Acids (FISH OIL) 1000 MG CAPS, Take 2 capsules by mouth daily.     Past medical history, social, surgical and family history all reviewed in electronic medical record.  No pertanent  information unless stated regarding to the chief complaint.   Review of Systems:  No headache, visual changes, nausea, vomiting, diarrhea, constipation, dizziness, abdominal pain, skin rash, fevers, chills, night sweats, weight loss, swollen lymph nodes, body aches, joint swelling,chest pain, shortness of breath, mood changes.  Positive muscle aches  Objective  Blood pressure 124/78, pulse 95, height 5\' 6"  (1.676 m), weight 180 lb (81.6 kg), SpO2 99 %.    General: No apparent distress alert and oriented x3 mood and affect normal, dressed appropriately.  HEENT: Pupils equal, extraocular movements intact  Respiratory: Patient's speak in full sentences and does not appear short of breath  Cardiovascular: Trace lower extremity edema, non tender, no erythema  Skin: Warm dry intact with no signs of infection or rash on extremities or on axial skeleton.  Abdomen: Soft nontender  Neuro: Cranial nerves II through XII are intact, neurovascularly intact in all extremities with 2+ DTRs and 2+ pulses.  Lymph: No lymphadenopathy of posterior or anterior cervical chain or axillae bilaterally.  Gait antalgic MSK:  Non tender with full range of motion and good stability and symmetric strength and tone of shoulders, elbows, wrist, hip, and ankles bilaterally.  Knee: Left valgus deformity noted.  Abnormal thigh to calf ratio.  Tender to palpation over medial and PF joint line.  ROM does like not significant flexion instability with valgus force.  painful patellar compression. Patellar glide with moderate crepitus. Patellar and quadriceps tendons unremarkable. Hamstring and quadriceps strength is normal. Contralateral knee shows minimal arthritic changes  After informed written and verbal consent, patient was seated on exam table. Left knee was prepped with alcohol swab and utilizing anterolateral approach, patient's left knee space was injected with 4:1  marcaine 0.5%: Kenalog 40mg /dL. Patient tolerated  the procedure well without immediate complications.   Impression and Recommendations:     This case required medical decision making of moderate complexity. The above documentation has been reviewed and is accurate and complete Lyndal Pulley, DO       Note: This dictation was prepared with Dragon dictation along with smaller phrase technology. Any transcriptional errors that result from this process are unintentional.

## 2018-07-08 NOTE — Patient Instructions (Signed)
See me again in 4 weeks Will get visco approval by then Wear brace with activity

## 2018-07-08 NOTE — Assessment & Plan Note (Signed)
Patient given injection.  Tolerated procedure. Patient reports to wear the brace nor regular basis.Discussed he could be a candidate for Visco supplementation.  We will consider this.  Follow-up again in 4 to 6 weeks

## 2018-08-04 ENCOUNTER — Other Ambulatory Visit: Payer: Self-pay

## 2018-08-04 ENCOUNTER — Encounter: Payer: Self-pay | Admitting: Family Medicine

## 2018-08-04 ENCOUNTER — Ambulatory Visit: Payer: Medicare HMO | Admitting: Family Medicine

## 2018-08-04 DIAGNOSIS — M1712 Unilateral primary osteoarthritis, left knee: Secondary | ICD-10-CM

## 2018-08-04 NOTE — Patient Instructions (Signed)
Good to see you Follow up with me in 2 months Knee will be full for a few days

## 2018-08-04 NOTE — Assessment & Plan Note (Signed)
Degenerative joint joint disease.  Failed all conservative therapy.  Monovisc given today.  Discussed icing regimen and home exercise.  Discussed how this can take up to 4 weeks to improve.  Patient does well can follow-up as needed but will have scheduled follow-up in 2 months.

## 2018-08-04 NOTE — Progress Notes (Signed)
[name] [place], [place] [address] Phone: [phone] Subjective:   I Jeremy [name] am serving as a Education administrator for Dr. Hulan Saas.  I'm seeing this patient by the request  of:    CC: Knee pain follow-up  [id number]   07/08/2018 Patient given injection.  Tolerated procedure. Patient reports to wear the brace nor regular basis.Discussed he could be a candidate for Visco supplementation.  We will consider this.  Follow-up again in 4 to 6 weeks  08/04/2018 Jeremy [name] is a 78 y.o. male coming in with complaint of knee pain. Monovisc injection. State the left knee is feeling better. Right knee is painful today.  Patient found to have severe arthritic changes.  Did get some better with the steroid injection but still having pain.    Past Medical History:  Diagnosis Date  . AK (actinic keratosis)   . Allergic rhinitis   . History of colonic polyps    Dr. Ardis Hughs- Due 2018  . History of skin cancer   . Osteopenia   . Prostate cancer ([place])    hx of s/p XRT, seeds Dr. Risa Grill   Past Surgical History:  Procedure Laterality Date  . INSERTION PROSTATE RADIATION SEED    . KNEE ARTHROSCOPY Right   . KNEE SURGERY Left   . TONSILLECTOMY    . XRT  2008   Social History   Socioeconomic History  . Marital status: Married    Spouse name: Not on file  . Number of children: 2  . Years of education: Not on file  . Highest education level: Not on file  Occupational History  . Occupation: retired from [employer]  Social Needs  . Financial resource strain: Not on file  . Food insecurity    Worry: Not on file    Inability: Not on file  . Transportation needs    Medical: Not on file    Non-medical: Not on file  Tobacco Use  . Smoking status: Current Some Day Smoker    Types: Cigars  . Smokeless tobacco: Never Used  . Tobacco comment: 2 cigars a month  Substance and Sexual Activity  . Alcohol use: Yes    Alcohol/week: 2.0 standard  drinks    Types: 2 Cans of beer per week  . Drug use: No  . Sexual activity: Yes  Lifestyle  . Physical activity    Days per week: Not on file    Minutes per session: Not on file  . Stress: Not on file  Relationships  . Social Herbalist on phone: Not on file    Gets together: Not on file    Attends religious service: Not on file    Active member of club or organization: Not on file    Attends meetings of clubs or organizations: Not on file    Relationship status: Not on file  Other Topics Concern  . Not on file  Social History Narrative  . Not on file   Allergies  Allergen Reactions  . Aspirin     REACTION: gi   Family History  Problem Relation Age of Onset  . Liver disease Father   . Cancer Father        pancreas ?  Marland Kitchen Cancer - Other Unknown        intestinal cancer      Current Outpatient Medications (Respiratory):  .  azelastine (ASTELIN) 0.1 % nasal spray, Use two sprays in each  nostril twice daily as needed for runny nose and sneezing. .  budesonide (RHINOCORT ALLERGY) 32 MCG/ACT nasal spray, Place 1 spray into both nostrils as needed. Reported on 02/26/2015 .  loratadine-pseudoephedrine (CLARITIN-D 24-HOUR) 10-240 MG 24 hr tablet, Take 1 tablet by mouth daily. .  montelukast (SINGULAIR) 10 MG tablet, Take 1 tablet (10 mg total) by mouth at bedtime. Marland Kitchen  oxymetazoline (AFRIN) 0.05 % nasal spray, Place 1 spray into both nostrils 2 (two) times daily.  Current Outpatient Medications (Analgesics):  Marland Kitchen  Acetaminophen (APAP 500 PO), Take 1 tablet by mouth 2 (two) times daily. Reported on 02/26/2015   Current Outpatient Medications (Other):  Marland Kitchen  Cholecalciferol 1000 UNITS tablet, Take 1,000 Units by mouth daily.   .  Omega-3 Fatty Acids (FISH OIL) 1000 MG CAPS, Take 2 capsules by mouth daily.     Past medical history, social, surgical and family history all reviewed in electronic medical record.  No pertanent information unless stated regarding to the chief  complaint.   Review of Systems:  No headache, visual changes, nausea, vomiting, diarrhea, constipation, dizziness, abdominal pain, skin rash, fevers, chills, night sweats, weight loss, swollen lymph nodes, body aches, joint swelling,  chest pain, shortness of breath, mood changes.  Positive muscle aches  Objective  Blood pressure 124/60, pulse 79, height 5\' 6"  (1.676 m), weight 181 lb (82.1 kg), SpO2 98 %.    General: No apparent distress alert and oriented x3 mood and affect normal, dressed appropriately.  HEENT: Pupils equal, extraocular movements intact  Respiratory: Patient's speak in full sentences and does not appear short of breath  Cardiovascular: No lower extremity edema, non tender, no erythema  Skin: Warm dry intact with no signs of infection or rash on extremities or on axial skeleton.  Abdomen: Soft nontender  Neuro: Cranial nerves II through XII are intact, neurovascularly intact in all extremities with 2+ DTRs and 2+ pulses.  Lymph: No lymphadenopathy of posterior or anterior cervical chain or axillae bilaterally.  Gait antalgic MSK:  Non tender with full range of motion and good stability and symmetric strength and tone of shoulders, elbows, wrist, hip and ankles bilaterally.  Knee: Left valgus deformity noted.  Abnormal thigh to calf ratio.  Tender to palpation over medial and PF joint line.  ROM full in flexion and extension and lower leg rotation. instability with valgus force.  painful patellar compression. Patellar glide with moderate crepitus. Patellar and quadriceps tendons unremarkable. Hamstring and quadriceps strength is normal. Contralateral knee shows arthritic changes with mild instability  After informed written and verbal consent, patient was seated on exam table. Left knee was prepped with alcohol swab and utilizing anterolateral approach, patient's left knee space was injected with 22 mg/mL of Monovisc (sodium hyaluronate) in a prefilled syringe was  injected easily into the knee through a 22-gauge needle..Patient tolerated the procedure well without immediate complications.    Impression and Recommendations:     This case required medical decision making of moderate complexity. The above documentation has been reviewed and is accurate and complete Lyndal Pulley, DO       Note: This dictation was prepared with Dragon dictation along with smaller phrase technology. Any transcriptional errors that result from this process are unintentional.

## 2018-08-22 DIAGNOSIS — R69 Illness, unspecified: Secondary | ICD-10-CM | POA: Diagnosis not present

## 2018-09-06 ENCOUNTER — Ambulatory Visit (INDEPENDENT_AMBULATORY_CARE_PROVIDER_SITE_OTHER): Payer: Medicare HMO | Admitting: Allergy and Immunology

## 2018-09-06 ENCOUNTER — Encounter: Payer: Self-pay | Admitting: Allergy and Immunology

## 2018-09-06 ENCOUNTER — Other Ambulatory Visit: Payer: Self-pay

## 2018-09-06 VITALS — BP 110/78 | HR 73 | Temp 97.5°F | Resp 16

## 2018-09-06 DIAGNOSIS — J3089 Other allergic rhinitis: Secondary | ICD-10-CM

## 2018-09-06 MED ORDER — MOMETASONE FUROATE 50 MCG/ACT NA SUSP: NASAL | 11 refills | Status: DC

## 2018-09-06 MED ORDER — METHYLPREDNISOLONE ACETATE 80 MG/ML IJ SUSP
80.0000 mg | Freq: Once | INTRAMUSCULAR | Status: AC
  Administered 2018-09-06: 17:00:00 80 mg via INTRAMUSCULAR

## 2018-09-06 NOTE — Progress Notes (Signed)
[facility]   Follow-up Note  Referring Provider: Cassandria Anger, MD Primary Provider: Cassandria Anger, MD Date of Office Visit: 09/06/2018  Subjective:   Jeremy [name] (DOB: 16-Mar-1940) is a 78 y.o. male who returns to the [facility] on 09/06/2018 in re-evaluation of the following:  HPI: Jeremy [name] returns to this clinic in reevaluation of his allergic rhinitis and a history of rhinitis medicamentosa.  I last saw him in his clinic on 18 January 2018.  He developed some issues with nasal congestion and sneezing and clear rhinorrhea usually during the nighttime hours ever since the spring has arrived.  This has never resolved throughout the entire summer.  He has been consistently using his nasal mometasone but has not been using any montelukast or nasal antihistamine on a regular basis.  He has reverted back to using Afrin a few times a week.  He can still smell and does not have any ugly nasal discharge and does not have any headaches.  Allergies as of 09/06/2018      Reactions   Aspirin    REACTION: gi      Medication List    APAP 500 PO Take 1 tablet by mouth 2 (two) times daily. Reported on 02/26/2015   azelastine 0.1 % nasal spray Commonly known as: ASTELIN Use two sprays in each nostril twice daily as needed for runny nose and sneezing.   Fish Oil 1000 MG Caps Take 2 capsules by mouth daily.   loratadine-pseudoephedrine 10-240 MG 24 hr tablet Commonly known as: CLARITIN-D 24-hour Take 1 tablet by mouth daily.   mometasone 50 MCG/ACT nasal spray Commonly known as: NASONEX Use 2 sprays in each nostril every night Started by: Jeremy [name] Kevan Rosebush, MD   montelukast 10 MG tablet Commonly known as: SINGULAIR Take 1 tablet (10 mg total) by mouth at bedtime.   Rhinocort Allergy 32 MCG/ACT nasal spray Generic drug: budesonide Place 1 spray into both nostrils as needed. Reported on 02/26/2015       Past  Medical History:  Diagnosis Date  . AK (actinic keratosis)   . Allergic rhinitis   . History of colonic polyps    Dr. Ardis Hughs- Due 2018  . History of skin cancer   . Osteopenia   . Prostate cancer ([place])    hx of s/p XRT, seeds Dr. Risa Grill    Past Surgical History:  Procedure Laterality Date  . INSERTION PROSTATE RADIATION SEED    . KNEE ARTHROSCOPY Right   . KNEE SURGERY Left   . TONSILLECTOMY    . XRT  2008    Review of systems negative except as noted in HPI / PMHx or noted below:  Review of Systems  Constitutional: Negative.   HENT: Negative.   Eyes: Negative.   Respiratory: Negative.   Cardiovascular: Negative.   Gastrointestinal: Negative.   Genitourinary: Negative.   Musculoskeletal: Negative.   Skin: Negative.   Neurological: Negative.   Endo/Heme/Allergies: Negative.   Psychiatric/Behavioral: Negative.      Objective:   Vitals:   09/06/18 1603  BP: 110/78  Pulse: 73  Resp: 16  Temp: (!) 97.5 F (36.4 C)  SpO2: 97%          Physical Exam Constitutional:      Appearance: He is not diaphoretic.  HENT:     Head: Normocephalic.     Right Ear: Tympanic membrane, ear canal and external ear normal.  Left Ear: Tympanic membrane, ear canal and external ear normal.     Nose: Mucosal edema present. No rhinorrhea.     Mouth/Throat:     Pharynx: Uvula midline. No oropharyngeal exudate.  Eyes:     Conjunctiva/sclera: Conjunctivae normal.  Neck:     Thyroid: No thyromegaly.     Trachea: Trachea normal. No tracheal tenderness or tracheal deviation.  Cardiovascular:     Rate and Rhythm: Normal rate and regular rhythm.     Heart sounds: Normal heart sounds, S1 normal and S2 normal. No murmur.  Pulmonary:     Effort: No respiratory distress.     Breath sounds: Normal breath sounds. No stridor. No wheezing or rales.  Lymphadenopathy:     Head:     Right side of head: No tonsillar adenopathy.     Left side of head: No tonsillar adenopathy.      Cervical: No cervical adenopathy.  Skin:    Findings: No erythema or rash.     Nails: There is no clubbing.   Neurological:     Mental Status: He is alert.     Diagnostics: none  Assessment and Plan:   1. Other allergic rhinitis     1. Every night use the following:   A.  Mometasone two sprays each nostril   B. Azelastine 2 sprays each nostril  C. Montelukast 10 mg tablet  2. If needed:   A. over-the-counter antihistamine  B. Over the counter nasal saline  3.  Depo-Medrol 80 IM delivered in clinic today  4. Return to clinic in 1 year or earlier if problem  5. Obtain fall flu vaccine (and COVID vaccine)  Jeremy [name] is very swollen up in his upper airway and we will treat him with anti-inflammatory agents noted above including the use of a systemic steroid.  Assuming he does well with this plan I can see him back in this clinic in 1 year or earlier if there is a problem.  Allena Katz, MD Allergy / Immunology [place]

## 2018-09-06 NOTE — Patient Instructions (Addendum)
  1. Every night use the following:   A.  Mometasone two sprays each nostril   B. Azelastine 2 sprays each nostril  C. Montelukast 10 mg tablet  2. If needed:   A. over-the-counter antihistamine  B. Over the counter nasal saline  3.  Depo-Medrol 80 IM delivered in clinic today  4. Return to clinic in 1 year or earlier if problem  5. Obtain fall flu vaccine (and COVID vaccine)

## 2018-09-07 ENCOUNTER — Encounter: Payer: Self-pay | Admitting: Allergy and Immunology

## 2018-09-15 DIAGNOSIS — C61 Malignant neoplasm of prostate: Secondary | ICD-10-CM | POA: Diagnosis not present

## 2018-09-28 ENCOUNTER — Other Ambulatory Visit: Payer: Self-pay

## 2018-09-28 ENCOUNTER — Emergency Department (HOSPITAL_BASED_OUTPATIENT_CLINIC_OR_DEPARTMENT_OTHER)
Admission: EM | Admit: 2018-09-28 | Discharge: 2018-09-28 | Disposition: A | Discharge status: Home / Self Care | Payer: Medicare HMO | Attending: Emergency Medicine | Admitting: Emergency Medicine

## 2018-09-28 ENCOUNTER — Encounter (HOSPITAL_BASED_OUTPATIENT_CLINIC_OR_DEPARTMENT_OTHER): Payer: Self-pay | Admitting: Emergency Medicine

## 2018-09-28 DIAGNOSIS — Z23 Encounter for immunization: Secondary | ICD-10-CM | POA: Diagnosis not present

## 2018-09-28 DIAGNOSIS — Y9239 Other specified sports and athletic area as the place of occurrence of the external cause: Secondary | ICD-10-CM | POA: Diagnosis not present

## 2018-09-28 DIAGNOSIS — W2104XA Struck by golf ball, initial encounter: Secondary | ICD-10-CM | POA: Diagnosis not present

## 2018-09-28 DIAGNOSIS — Z8546 Personal history of malignant neoplasm of prostate: Secondary | ICD-10-CM | POA: Diagnosis not present

## 2018-09-28 DIAGNOSIS — S81812A Laceration without foreign body, left lower leg, initial encounter: Secondary | ICD-10-CM | POA: Insufficient documentation

## 2018-09-28 DIAGNOSIS — R69 Illness, unspecified: Secondary | ICD-10-CM | POA: Diagnosis not present

## 2018-09-28 DIAGNOSIS — Z79899 Other long term (current) drug therapy: Secondary | ICD-10-CM | POA: Diagnosis not present

## 2018-09-28 DIAGNOSIS — Y999 Unspecified external cause status: Secondary | ICD-10-CM | POA: Insufficient documentation

## 2018-09-28 DIAGNOSIS — F17210 Nicotine dependence, cigarettes, uncomplicated: Secondary | ICD-10-CM | POA: Diagnosis not present

## 2018-09-28 DIAGNOSIS — Y9353 Activity, golf: Secondary | ICD-10-CM | POA: Insufficient documentation

## 2018-09-28 MED ORDER — TETANUS-DIPHTH-ACELL PERTUSSIS 5-2.5-18.5 LF-MCG/0.5 IM SUSP
INTRAMUSCULAR | Status: AC
  Filled 2018-09-28: qty 0.5

## 2018-09-28 MED ORDER — TETANUS-DIPHTH-ACELL PERTUSSIS 5-2.5-18.5 LF-MCG/0.5 IM SUSP
0.5000 mL | Freq: Once | INTRAMUSCULAR | Status: AC
  Administered 2018-09-28: 0.5 mL via INTRAMUSCULAR

## 2018-09-28 MED ORDER — LIDOCAINE HCL (PF) 1 % IJ SOLN
30.0000 mL | Freq: Once | INTRAMUSCULAR | Status: AC
  Administered 2018-09-28: 18:00:00 30 mL

## 2018-09-28 NOTE — ED Triage Notes (Signed)
Left shin hit with golf ball today. Open laceration. Bleeding controlled ambulatory

## 2018-09-28 NOTE — Discharge Instructions (Addendum)
1. Medications: Tylenol for pain, usual home medications  2. Treatment: ice for swelling, keep wound clean with warm soap and water and keep bandage dry, do not submerge in water for 24 hours  3. Follow Up: Please return in 10 days to have your stitches removed or sooner if you have concerns. Return to the emergency department for increased redness, drainage of pus from the wound.  Did have your sutures removed in the emergency department or by your primary care doctor.   WOUND CARE  Keep area clean and dry for 24 hours. Do not remove bandage, if applied.  After 24 hours, remove bandage and wash wound gently with mild soap and warm water. Reapply a new bandage after cleaning wound, if directed.   Continue daily cleansing with soap and water until stitches/staples are removed.  Do not apply any ointments or creams to the wound while stitches/staples are in place, as this may cause delayed healing. Return if you experience any of the following signs of infection: Swelling, redness, pus drainage, streaking, fever >101.0 F  Return if you experience excessive bleeding that does not stop after 15-20 minutes of constant, firm pressure.

## 2018-09-28 NOTE — ED Provider Notes (Signed)
[facility] EMERGENCY DEPARTMENT Provider Note   CSN: [id number] Arrival date & time: 09/28/18  1502     History   Chief Complaint Chief Complaint  Patient presents with  . Leg Injury    HPI Jeremy [name] is a 78 y.o. male last medical history as listed below presents emergency department today with chief complaint of laceration to left leg.  This happened approximately 4 hours prior to arrival.  Patient states he was playing golf this morning when another golfer hit a ball that bounced off the ground and hit him in the left shin.  He was able to continue playing golf but later when he got home he realized that he had bleeding and a laceration.  He cleaned it with soap and water.  He reports localized pain that he describes as an aching.  He rates the pain 1 out of 10 in severity.  He did not take anything for pain prior to arrival.  He is unsure of last tetanus immunization.  He denies fever, chills, numbness, tingling, back pain, fall.  He is not anticoagulated. History provided by patient with additional history obtained from chart review.      Past Medical History:  Diagnosis Date  . AK (actinic keratosis)   . Allergic rhinitis   . History of colonic polyps    Dr. Ardis Hughs- Due 2018  . History of skin cancer   . Osteopenia   . Prostate cancer ([place])    hx of s/p XRT, seeds Dr. Risa Grill    Patient Active Problem List   Diagnosis Date Noted  . Upper respiratory infection 12/21/2017  . Greater trochanteric bursitis, left 08/16/2017  . Patellofemoral arthritis of right knee 07/30/2017  . Low back pain 11/12/2016  . Otitis, externa, infective 09/24/2015  . Cerumen impaction 10/22/2014  . Greater trochanteric bursitis of right hip 10/01/2014  . Allergic rhinitis due to pollen 09/21/2014  . Elevated BP without diagnosis of hypertension 02/19/2014  . Plantar fasciitis 09/07/2013  . Primary localized osteoarthrosis, lower leg 03/15/2013  . Left knee pain 02/03/2013   . Osteoarthritis of left knee 02/03/2013  . Rash and nonspecific skin eruption 06/27/2012  . Dyslipidemia 10/08/2011  . Well adult exam 09/22/2010  . Hip pain, left 09/22/2010  . RHINITIS 01/20/2010  . Acute sinusitis 01/08/2010  . CAROTID BRUIT 08/22/2009  . LYMPHADENITIS 03/20/2009  . SKIN CANCER, HX OF 05/04/2008  . HEMATURIA 03/27/2008  . VOMITING 10/31/2007  . ABDOMINAL PAIN, RIGHT LOWER QUADRANT 10/31/2007  . COLONIC POLYPS, HX OF 05/02/2007  . Allergic rhinitis 11/18/2006  . OSTEOPENIA 11/18/2006  . PROSTATE CANCER, HX OF 10/14/2006    Past Surgical History:  Procedure Laterality Date  . INSERTION PROSTATE RADIATION SEED    . KNEE ARTHROSCOPY Right   . KNEE SURGERY Left   . TONSILLECTOMY    . XRT  2008        Home Medications    Prior to Admission medications   Medication Sig Start Date End Date Taking? Authorizing Provider  Acetaminophen (APAP 500 PO) Take 1 tablet by mouth 2 (two) times daily. Reported on 02/26/2015    [provider]  azelastine (ASTELIN) 0.1 % nasal spray Use two sprays in each nostril twice daily as needed for runny nose and sneezing. 01/18/18   Kozlow, Donnamarie Poag, MD  budesonide (RHINOCORT ALLERGY) 32 MCG/ACT nasal spray Place 1 spray into both nostrils as needed. Reported on 02/26/2015    [provider]  loratadine-pseudoephedrine (CLARITIN-D  24-HOUR) 10-240 MG 24 hr tablet Take 1 tablet by mouth daily.    [provider]  mometasone (NASONEX) 50 MCG/ACT nasal spray Use 2 sprays in each nostril every night 09/06/18   Kozlow, Donnamarie Poag, MD  montelukast (SINGULAIR) 10 MG tablet Take 1 tablet (10 mg total) by mouth at bedtime. 01/18/18   Kozlow, Donnamarie Poag, MD  Omega-3 Fatty Acids (FISH OIL) 1000 MG CAPS Take 2 capsules by mouth daily.     [provider]    Family History Family History  Problem Relation Age of Onset  . Liver disease Father   . Cancer Father        pancreas ?  Marland Kitchen Cancer - Other Other        intestinal  cancer    Social History Social History   Tobacco Use  . Smoking status: Current Some Day Smoker    Types: Cigars  . Smokeless tobacco: Never Used  . Tobacco comment: 2 cigars a month  Substance Use Topics  . Alcohol use: Yes    Alcohol/week: 2.0 standard drinks    Types: 2 Cans of beer per week  . Drug use: No     Allergies   Aspirin   Review of Systems Review of Systems  Constitutional: Negative for chills and fever.  Gastrointestinal: Negative for abdominal pain, nausea and vomiting.  Musculoskeletal: Negative for arthralgias, back pain and myalgias.  Skin: Positive for wound.  Allergic/Immunologic: Negative for immunocompromised state.     Physical Exam Updated Vital Signs BP (!) 160/89 (BP Location: Right Arm)   Pulse 79   Temp 98.1 F (36.7 C) (Oral)   Resp 16   SpO2 100%   Physical Exam Vitals signs and nursing note reviewed.  Constitutional:      Appearance: He is well-developed. He is not ill-appearing or toxic-appearing.  HENT:     Head: Normocephalic and atraumatic.     Nose: Nose normal.  Eyes:     General: No scleral icterus.       Right eye: No discharge.        Left eye: No discharge.     Conjunctiva/sclera: Conjunctivae normal.  Neck:     Musculoskeletal: Normal range of motion.     Vascular: No JVD.  Cardiovascular:     Rate and Rhythm: Normal rate and regular rhythm.     Pulses: Normal pulses.     Heart sounds: Normal heart sounds.  Pulmonary:     Effort: Pulmonary effort is normal.     Breath sounds: Normal breath sounds.  Abdominal:     General: There is no distension.  Musculoskeletal: Normal range of motion.     Comments: Pt moves all extremities without signs of injury.  Skin:    General: Skin is warm and dry.          Comments: 2 cm gaping laceration as depicted in image above. Bleeding is controlled.  Neurological:     Mental Status: He is oriented to person, place, and time.     GCS: GCS eye subscore is 4. GCS verbal  subscore is 5. GCS motor subscore is 6.     Comments: Fluent speech, no facial droop.  Psychiatric:        Behavior: Behavior normal.      ED Treatments / Results  Labs (all labs ordered are listed, but only abnormal results are displayed) Labs Reviewed - No data to display  EKG None  Radiology No results found.  Procedures .Marland KitchenLaceration Repair  Date/Time: 09/28/2018 5:41 PM Performed by: Cherre Robins, PA-C Authorized by: Cherre Robins, PA-C   Consent:    Consent obtained:  Verbal   Consent given by:  Patient   Risks discussed:  Infection and pain   Alternatives discussed:  No treatment Anesthesia (see MAR for exact dosages):    Anesthesia method:  Local infiltration   Local anesthetic:  Lidocaine 2% w/o epi Laceration details:    Location:  Leg   Leg location:  L lower leg   Length (cm):  2 Repair type:    Repair type:  Simple Pre-procedure details:    Preparation:  Patient was prepped and draped in usual sterile fashion Exploration:    Hemostasis achieved with:  Direct pressure   Wound exploration: wound explored through full range of motion and entire depth of wound probed and visualized   Treatment:    Amount of cleaning:  Standard   Irrigation solution:  Sterile saline   Irrigation volume:  500 ml   Irrigation method:  Pressure wash Skin repair:    Repair method:  Sutures   Suture size:  4-0   Suture material:  Prolene   Suture technique:  Simple interrupted   Number of sutures:  3 Approximation:    Approximation:  Close Post-procedure details:    Patient tolerance of procedure:  Tolerated well, no immediate complications   (including critical care time)  Medications Ordered in ED Medications  lidocaine (PF) (XYLOCAINE) 1 % injection 30 mL (has no administration in time range)  Tdap (BOOSTRIX) 5-2.5-18.5 LF-MCG/0.5 injection (has no administration in time range)  Tdap (BOOSTRIX) injection 0.5 mL (0.5 mLs Intramuscular Given 09/28/18  1741)     Initial Impression / Assessment and Plan / ED Course  I have reviewed the triage vital signs and the nursing notes.  Pertinent labs & imaging results that were available during my care of the patient were reviewed by me and considered in my medical decision making (see chart for details).    Patient presents to the emergency department with laceration to left shin which occurred within 8 hours PTA. Patient nontoxic appearing, resting comfortably. Pressure irrigation performed. Wound explored and base of wound visualized in a bloodless field without evidence of foreign body. Laceration repair per procedure note above, tolerated well. Tetanus updated at today's visit. Do not feel that abx are indicated at this time based on wound appearance and lack of significant comorbidities. Discussed suture home care as well as need for wound recheck and suture removal in 10 days.  I discussed results, treatment plan, need for follow-up, and return precautions with the patient including signs of infection. Provided opportunity for questions, patient confirmed understanding and is in agreement with plan.      Final Clinical Impressions(s) / ED Diagnoses   Final diagnoses:  Leg laceration, left, initial encounter    ED Discharge Orders    None       Cherre Robins, PA-C 09/28/18 2351    Charlesetta Shanks, MD 09/29/18 1404

## 2018-09-29 DIAGNOSIS — Z8546 Personal history of malignant neoplasm of prostate: Secondary | ICD-10-CM | POA: Diagnosis not present

## 2018-09-29 DIAGNOSIS — N528 Other male erectile dysfunction: Secondary | ICD-10-CM | POA: Diagnosis not present

## 2018-10-05 ENCOUNTER — Other Ambulatory Visit: Payer: Self-pay

## 2018-10-05 ENCOUNTER — Ambulatory Visit (INDEPENDENT_AMBULATORY_CARE_PROVIDER_SITE_OTHER): Payer: Medicare HMO | Admitting: Internal Medicine

## 2018-10-05 ENCOUNTER — Encounter: Payer: Self-pay | Admitting: Internal Medicine

## 2018-10-05 VITALS — BP 126/78 | HR 78 | Temp 98.2°F | Ht 66.0 in | Wt 180.0 lb

## 2018-10-05 DIAGNOSIS — Z23 Encounter for immunization: Secondary | ICD-10-CM

## 2018-10-05 DIAGNOSIS — L821 Other seborrheic keratosis: Secondary | ICD-10-CM | POA: Diagnosis not present

## 2018-10-05 DIAGNOSIS — S81812D Laceration without foreign body, left lower leg, subsequent encounter: Secondary | ICD-10-CM | POA: Insufficient documentation

## 2018-10-05 DIAGNOSIS — L570 Actinic keratosis: Secondary | ICD-10-CM | POA: Diagnosis not present

## 2018-10-05 DIAGNOSIS — D692 Other nonthrombocytopenic purpura: Secondary | ICD-10-CM | POA: Diagnosis not present

## 2018-10-05 NOTE — Assessment & Plan Note (Signed)
Healing. RTC on Mon for suture removal Wound was dressed

## 2018-10-05 NOTE — Progress Notes (Signed)
Subjective:  Patient ID: Jeremy [name], male    DOB: 10-02-1940  Age: 78 y.o. MRN: [id number]  CC: No chief complaint on file.   HPI Jeremy [name] presents for L shin laceration (he was hit by a golf ball) 8 d ago repaired w/3 sutures  Outpatient Medications Prior to Visit  Medication Sig Dispense Refill  . Acetaminophen (APAP 500 PO) Take 1 tablet by mouth 2 (two) times daily. Reported on 02/26/2015    . azelastine (ASTELIN) 0.1 % nasal spray Use two sprays in each nostril twice daily as needed for runny nose and sneezing. 30 mL 5  . budesonide (RHINOCORT ALLERGY) 32 MCG/ACT nasal spray Place 1 spray into both nostrils as needed. Reported on 02/26/2015    . loratadine-pseudoephedrine (CLARITIN-D 24-HOUR) 10-240 MG 24 hr tablet Take 1 tablet by mouth daily.    . mometasone (NASONEX) 50 MCG/ACT nasal spray Use 2 sprays in each nostril every night 17 g 11  . montelukast (SINGULAIR) 10 MG tablet Take 1 tablet (10 mg total) by mouth at bedtime. 30 tablet 11  . Omega-3 Fatty Acids (FISH OIL) 1000 MG CAPS Take 2 capsules by mouth daily.      No facility-administered medications prior to visit.     ROS: Review of Systems  Constitutional: Negative for fever.  Skin: Positive for wound. Negative for color change.    Objective:  BP 126/78 (BP Location: Left Arm, Patient Position: Sitting, Cuff Size: Normal)   Pulse 78   Temp 98.2 F (36.8 C) (Oral)   Ht 5\' 6"  (1.676 m)   Wt 180 lb (81.6 kg)   SpO2 98%   BMI 29.05 kg/m   BP Readings from Last 3 Encounters:  10/05/18 126/78  09/28/18 (!) 160/89  09/06/18 110/78    Wt Readings from Last 3 Encounters:  10/05/18 180 lb (81.6 kg)  08/04/18 181 lb (82.1 kg)  07/08/18 180 lb (81.6 kg)    Physical Exam Constitutional:      General: He is not in acute distress.    Appearance: He is well-developed.     Comments: NAD  Eyes:     Conjunctiva/sclera: Conjunctivae normal.     Pupils: Pupils are equal, round, and reactive to  light.  Neck:     Musculoskeletal: Normal range of motion.     Thyroid: No thyromegaly.     Vascular: No JVD.  Cardiovascular:     Rate and Rhythm: Normal rate and regular rhythm.     Heart sounds: Normal heart sounds. No murmur. No friction rub. No gallop.   Pulmonary:     Effort: Pulmonary effort is normal. No respiratory distress.     Breath sounds: Normal breath sounds. No wheezing or rales.  Chest:     Chest wall: No tenderness.  Abdominal:     General: Bowel sounds are normal. There is no distension.     Palpations: Abdomen is soft. There is no mass.     Tenderness: There is no abdominal tenderness. There is no guarding or rebound.  Musculoskeletal: Normal range of motion.        General: No tenderness.  Lymphadenopathy:     Cervical: No cervical adenopathy.  Skin:    General: Skin is warm and dry.     Findings: No erythema or rash.  Neurological:     Mental Status: He is alert and oriented to person, place, and time.     Cranial Nerves: No cranial nerve deficit.  Motor: No abnormal muscle tone.     Coordination: Coordination normal.     Gait: Gait normal.     Deep Tendon Reflexes: Reflexes are normal and symmetric.  Psychiatric:        Behavior: Behavior normal.        Thought Content: Thought content normal.        Judgment: Judgment normal.   wound L shin - healing  Lab Results  Component Value Date   WBC 5.4 11/15/2017   HGB 16.4 11/15/2017   HCT 47.6 11/15/2017   PLT 202.0 11/15/2017   GLUCOSE 102 (H) 11/15/2017   CHOL 156 11/15/2017   TRIG 65.0 11/15/2017   HDL 42.90 11/15/2017   LDLCALC 100 (H) 11/15/2017   ALT 22 11/15/2017   AST 22 11/15/2017   NA 140 11/15/2017   K 4.2 11/15/2017   CL 105 11/15/2017   CREATININE 0.90 11/15/2017   BUN 20 11/15/2017   CO2 27 11/15/2017   TSH 1.07 11/15/2017   PSA 0.01 (L) 10/20/2013   INR 1.0 ratio 03/27/2008    No results found.  Assessment & Plan:   Diagnoses and all orders for this visit:  Need  for influenza vaccination -     Flu Vaccine QUAD High Dose(Fluad)     No orders of the defined types were placed in this encounter.    Follow-up: No follow-ups on file.  Walker Kehr, MD

## 2018-10-10 ENCOUNTER — Encounter: Payer: Self-pay | Admitting: Internal Medicine

## 2018-10-10 ENCOUNTER — Other Ambulatory Visit: Payer: Self-pay

## 2018-10-10 ENCOUNTER — Ambulatory Visit (INDEPENDENT_AMBULATORY_CARE_PROVIDER_SITE_OTHER): Payer: Medicare HMO | Admitting: Internal Medicine

## 2018-10-10 DIAGNOSIS — S81812D Laceration without foreign body, left lower leg, subsequent encounter: Secondary | ICD-10-CM | POA: Diagnosis not present

## 2018-10-10 NOTE — Progress Notes (Signed)
   Subjective:  Patient ID: Jeremy [name], male    DOB: [birth year]  Age: 78 y.o. MRN: [id number]  CC: No chief complaint on file.   HPI Jeremy [name] presents for suture removal  Outpatient Medications Prior to Visit  Medication Sig Dispense Refill  . Acetaminophen (APAP 500 PO) Take 1 tablet by mouth 2 (two) times daily. Reported on 02/26/2015    . azelastine (ASTELIN) 0.1 % nasal spray Use two sprays in each nostril twice daily as needed for runny nose and sneezing. 30 mL 5  . budesonide (RHINOCORT ALLERGY) 32 MCG/ACT nasal spray Place 1 spray into both nostrils as needed. Reported on 02/26/2015    . loratadine-pseudoephedrine (CLARITIN-D 24-HOUR) 10-240 MG 24 hr tablet Take 1 tablet by mouth daily.    . mometasone (NASONEX) 50 MCG/ACT nasal spray Use 2 sprays in each nostril every night 17 g 11  . montelukast (SINGULAIR) 10 MG tablet Take 1 tablet (10 mg total) by mouth at bedtime. 30 tablet 11  . Omega-3 Fatty Acids (FISH OIL) 1000 MG CAPS Take 2 capsules by mouth daily.      No facility-administered medications prior to visit.     ROS: Review of Systems  Constitutional: Negative for fever.    Objective:  BP 124/76 (BP Location: Left Arm, Patient Position: Sitting, Cuff Size: Normal)   Pulse 66   Temp 98 F (36.7 C) (Oral)   Ht 5\' 6"  (1.676 m)   Wt 180 lb (81.6 kg)   SpO2 95%   BMI 29.05 kg/m   BP Readings from Last 3 Encounters:  10/10/18 124/76  10/05/18 126/78  09/28/18 (!) 160/89    Wt Readings from Last 3 Encounters:  10/10/18 180 lb (81.6 kg)  10/05/18 180 lb (81.6 kg)  08/04/18 181 lb (82.1 kg)    Physical Exam  Wound - L shin. Sutures x 3 removed FTF 10 min   Lab Results  Component Value Date   WBC 5.4 11/15/2017   HGB 16.4 11/15/2017   HCT 47.6 11/15/2017   PLT 202.0 11/15/2017   GLUCOSE 102 (H) 11/15/2017   CHOL 156 11/15/2017   TRIG 65.0 11/15/2017   HDL 42.90 11/15/2017   LDLCALC 100 (H) 11/15/2017   ALT 22 11/15/2017   AST 22  11/15/2017   NA 140 11/15/2017   K 4.2 11/15/2017   CL 105 11/15/2017   CREATININE 0.90 11/15/2017   BUN 20 11/15/2017   CO2 27 11/15/2017   TSH 1.07 11/15/2017   PSA 0.01 (L) 10/20/2013   INR 1.0 ratio 03/27/2008    No results found.  Assessment & Plan:   There are no diagnoses linked to this encounter.   No orders of the defined types were placed in this encounter.    Follow-up: No follow-ups on file.  Walker Kehr, MD

## 2018-10-10 NOTE — Assessment & Plan Note (Signed)
Sutures removed.

## 2018-10-17 ENCOUNTER — Ambulatory Visit: Payer: Medicare HMO | Admitting: Internal Medicine

## 2018-11-21 ENCOUNTER — Other Ambulatory Visit: Payer: Self-pay

## 2018-11-21 ENCOUNTER — Other Ambulatory Visit (INDEPENDENT_AMBULATORY_CARE_PROVIDER_SITE_OTHER): Payer: Medicare HMO

## 2018-11-21 ENCOUNTER — Ambulatory Visit (INDEPENDENT_AMBULATORY_CARE_PROVIDER_SITE_OTHER): Payer: Medicare HMO | Admitting: Internal Medicine

## 2018-11-21 ENCOUNTER — Encounter: Payer: Self-pay | Admitting: Internal Medicine

## 2018-11-21 VITALS — BP 136/84 | HR 71 | Temp 98.0°F | Ht 66.0 in | Wt 180.0 lb

## 2018-11-21 DIAGNOSIS — Z8546 Personal history of malignant neoplasm of prostate: Secondary | ICD-10-CM | POA: Diagnosis not present

## 2018-11-21 DIAGNOSIS — Z85828 Personal history of other malignant neoplasm of skin: Secondary | ICD-10-CM | POA: Diagnosis not present

## 2018-11-21 DIAGNOSIS — Z0000 Encounter for general adult medical examination without abnormal findings: Secondary | ICD-10-CM

## 2018-11-21 DIAGNOSIS — E785 Hyperlipidemia, unspecified: Secondary | ICD-10-CM | POA: Diagnosis not present

## 2018-11-21 LAB — HEPATIC FUNCTION PANEL
ALT: 29 U/L (ref 0–53)
AST: 29 U/L (ref 0–37)
Albumin: 3.5 g/dL (ref 3.5–5.2)
Alkaline Phosphatase: 60 U/L (ref 39–117)
Bilirubin, Direct: 0.2 mg/dL (ref 0.0–0.3)
Total Bilirubin: 0.7 mg/dL (ref 0.2–1.2)
Total Protein: 6.1 g/dL (ref 6.0–8.3)

## 2018-11-21 LAB — URINALYSIS, ROUTINE W REFLEX MICROSCOPIC
Bilirubin Urine: NEGATIVE
Ketones, ur: NEGATIVE
Leukocytes,Ua: NEGATIVE
Nitrite: NEGATIVE
Specific Gravity, Urine: 1.02 (ref 1.000–1.030)
Total Protein, Urine: 30 — AB
Urine Glucose: NEGATIVE
Urobilinogen, UA: 2 — AB (ref 0.0–1.0)
pH: 6 (ref 5.0–8.0)

## 2018-11-21 LAB — BASIC METABOLIC PANEL
BUN: 17 mg/dL (ref 6–23)
CO2: 27 mEq/L (ref 19–32)
Calcium: 8.2 mg/dL — ABNORMAL LOW (ref 8.4–10.5)
Chloride: 108 mEq/L (ref 96–112)
Creatinine, Ser: 0.75 mg/dL (ref 0.40–1.50)
GFR: 100.58 mL/min (ref >60.00)
Glucose, Bld: 108 mg/dL — ABNORMAL HIGH (ref 70–99)
Potassium: 3.6 mEq/L (ref 3.5–5.1)
Sodium: 142 mEq/L (ref 135–145)

## 2018-11-21 LAB — PSA: PSA: 0.02 ng/mL — ABNORMAL LOW (ref 0.10–4.00)

## 2018-11-21 LAB — TSH: TSH: 2.43 u[IU]/mL (ref 0.35–4.50)

## 2018-11-21 LAB — CBC WITH DIFFERENTIAL/PLATELET
Basophils Absolute: 0 10*3/uL (ref 0.0–0.1)
Basophils Relative: 0.5 % (ref 0.0–3.0)
Eosinophils Absolute: 0 10*3/uL (ref 0.0–0.7)
Eosinophils Relative: 0.5 % (ref 0.0–5.0)
HCT: 41 % (ref 39.0–52.0)
Hemoglobin: 14.3 g/dL (ref 13.0–17.0)
Lymphocytes Relative: 43.8 % (ref 12.0–46.0)
Lymphs Abs: 1.5 10*3/uL (ref 0.7–4.0)
MCHC: 34.8 g/dL (ref 30.0–36.0)
MCV: 98.9 fl (ref 78.0–100.0)
Monocytes Absolute: 0.5 10*3/uL (ref 0.1–1.0)
Monocytes Relative: 13.7 % — ABNORMAL HIGH (ref 3.0–12.0)
Neutro Abs: 1.4 10*3/uL (ref 1.4–7.7)
Neutrophils Relative %: 41.5 % — ABNORMAL LOW (ref 43.0–77.0)
Platelets: 172 10*3/uL (ref 150.0–400.0)
RBC: 4.15 Mil/uL — ABNORMAL LOW (ref 4.22–5.81)
RDW: 12.8 % (ref 11.5–15.5)
WBC: 3.4 10*3/uL — ABNORMAL LOW (ref 4.0–10.5)

## 2018-11-21 LAB — LIPID PANEL
Cholesterol: 135 mg/dL (ref 0–200)
HDL: 32 mg/dL — ABNORMAL LOW (ref >39.00)
LDL Cholesterol: 88 mg/dL (ref 0–99)
NonHDL: 103.44
Total CHOL/HDL Ratio: 4
Triglycerides: 75 mg/dL (ref 0.0–149.0)
VLDL: 15 mg/dL (ref 0.0–40.0)

## 2018-11-21 NOTE — Progress Notes (Signed)
Subjective:  Patient ID: Jeremy [name], male    DOB: 19-Jul-1940  Age: 79 y.o. MRN: [id number]  CC: No chief complaint on file.   HPI Jeremy [name] presents for a well exam   Outpatient Medications Prior to Visit  Medication Sig Dispense Refill  . Acetaminophen (APAP 500 PO) Take 1 tablet by mouth 2 (two) times daily. Reported on 02/26/2015    . azelastine (ASTELIN) 0.1 % nasal spray Use two sprays in each nostril twice daily as needed for runny nose and sneezing. 30 mL 5  . budesonide (RHINOCORT ALLERGY) 32 MCG/ACT nasal spray Place 1 spray into both nostrils as needed. Reported on 02/26/2015    . loratadine-pseudoephedrine (CLARITIN-D 24-HOUR) 10-240 MG 24 hr tablet Take 1 tablet by mouth daily.    . mometasone (NASONEX) 50 MCG/ACT nasal spray Use 2 sprays in each nostril every night 17 g 11  . montelukast (SINGULAIR) 10 MG tablet Take 1 tablet (10 mg total) by mouth at bedtime. 30 tablet 11  . Omega-3 Fatty Acids (FISH OIL) 1000 MG CAPS Take 2 capsules by mouth daily.      No facility-administered medications prior to visit.     ROS: Review of Systems  Constitutional: Negative for appetite change, fatigue and unexpected weight change.  HENT: Negative for congestion, nosebleeds, sneezing, sore throat and trouble swallowing.   Eyes: Negative for itching and visual disturbance.  Respiratory: Negative for cough.   Cardiovascular: Negative for chest pain, palpitations and leg swelling.  Gastrointestinal: Negative for abdominal distention, blood in stool, diarrhea and nausea.  Genitourinary: Negative for frequency and hematuria.  Musculoskeletal: Negative for back pain, gait problem, joint swelling and neck pain.  Skin: Positive for rash.  Neurological: Negative for dizziness, tremors, speech difficulty and weakness.  Psychiatric/Behavioral: Negative for agitation, dysphoric mood, sleep disturbance and suicidal ideas. The patient is not nervous/anxious.     Objective:  BP  136/84 (BP Location: Left Arm, Patient Position: Sitting, Cuff Size: Normal)   Pulse 71   Temp 98 F (36.7 C) (Oral)   Ht 5\' 6"  (1.676 m)   Wt 180 lb (81.6 kg)   SpO2 99%   BMI 29.05 kg/m   BP Readings from Last 3 Encounters:  11/21/18 136/84  10/10/18 124/76  10/05/18 126/78    Wt Readings from Last 3 Encounters:  11/21/18 180 lb (81.6 kg)  10/10/18 180 lb (81.6 kg)  10/05/18 180 lb (81.6 kg)    Physical Exam Constitutional:      General: He is not in acute distress.    Appearance: He is well-developed.     Comments: NAD  Eyes:     Conjunctiva/sclera: Conjunctivae normal.     Pupils: Pupils are equal, round, and reactive to light.  Neck:     Musculoskeletal: Normal range of motion.     Thyroid: No thyromegaly.     Vascular: No JVD.  Cardiovascular:     Rate and Rhythm: Normal rate and regular rhythm.     Heart sounds: Normal heart sounds. No murmur. No friction rub. No gallop.   Pulmonary:     Effort: Pulmonary effort is normal. No respiratory distress.     Breath sounds: Normal breath sounds. No wheezing or rales.  Chest:     Chest wall: No tenderness.  Abdominal:     General: Bowel sounds are normal. There is no distension.     Palpations: Abdomen is soft. There is no mass.     Tenderness: There is  no abdominal tenderness. There is no guarding or rebound.  Musculoskeletal: Normal range of motion.        General: No tenderness.  Lymphadenopathy:     Cervical: No cervical adenopathy.  Skin:    General: Skin is warm and dry.     Findings: Erythema and rash present.  Neurological:     Mental Status: He is alert and oriented to person, place, and time.     Cranial Nerves: No cranial nerve deficit.     Motor: No abnormal muscle tone.     Coordination: Coordination normal.     Gait: Gait normal.     Deep Tendon Reflexes: Reflexes are normal and symmetric.  Psychiatric:        Behavior: Behavior normal.        Thought Content: Thought content normal.         Judgment: Judgment normal.    5 FU cream treated forehead - extensive crusting  Lab Results  Component Value Date   WBC 5.4 11/15/2017   HGB 16.4 11/15/2017   HCT 47.6 11/15/2017   PLT 202.0 11/15/2017   GLUCOSE 102 (H) 11/15/2017   CHOL 156 11/15/2017   TRIG 65.0 11/15/2017   HDL 42.90 11/15/2017   LDLCALC 100 (H) 11/15/2017   ALT 22 11/15/2017   AST 22 11/15/2017   NA 140 11/15/2017   K 4.2 11/15/2017   CL 105 11/15/2017   CREATININE 0.90 11/15/2017   BUN 20 11/15/2017   CO2 27 11/15/2017   TSH 1.07 11/15/2017   PSA 0.01 (L) 10/20/2013   INR 1.0 ratio 03/27/2008    No results found.  Assessment & Plan:   There are no diagnoses linked to this encounter.   No orders of the defined types were placed in this encounter.    Follow-up: No follow-ups on file.  Walker Kehr, MD

## 2018-11-21 NOTE — Patient Instructions (Signed)
If you have medicare related insurance (such as traditional Medicare, Blue Cross Medicare, [employer] Medicare, or similar), Please make an appointment at the scheduling desk with Jill, the Wellness Health Coach, for your Wellness visit in this office, which is a benefit with your insurance.  

## 2018-11-21 NOTE — Assessment & Plan Note (Signed)
CT ca scoring nl "0" score, no CAD 12/19

## 2018-11-21 NOTE — Assessment & Plan Note (Signed)
5 FU cream treated forehead - extensive crusting

## 2018-11-21 NOTE — Assessment & Plan Note (Addendum)
We discussed age appropriate health related issues, including available/recomended screening tests and vaccinations. We discussed a need for adhering to healthy diet and exercise. Labs were ordered to be later reviewed . All questions were answered.  Colon 2018 CT ca scoring nl "0" score, no CAD 12/19

## 2018-11-21 NOTE — Assessment & Plan Note (Signed)
PSA

## 2018-11-29 ENCOUNTER — Other Ambulatory Visit: Payer: Self-pay

## 2018-11-29 ENCOUNTER — Ambulatory Visit (INDEPENDENT_AMBULATORY_CARE_PROVIDER_SITE_OTHER): Payer: Medicare HMO | Admitting: Family Medicine

## 2018-11-29 ENCOUNTER — Encounter: Payer: Self-pay | Admitting: Family Medicine

## 2018-11-29 DIAGNOSIS — M1712 Unilateral primary osteoarthritis, left knee: Secondary | ICD-10-CM | POA: Diagnosis not present

## 2018-11-29 NOTE — Assessment & Plan Note (Signed)
Left knee injected again.  Did not respond as well to the viscosupplementation.  Patient has responded better to the injections.  Hopefully this will be more beneficial.  Discussed wearing the brace on a regular basis.  Patient will follow up with me again 4-8 weeks

## 2018-11-29 NOTE — Patient Instructions (Signed)
Pennsaid 2x a day, fingertip sized amount See me again in 8 weeks

## 2018-11-29 NOTE — Progress Notes (Signed)
[name] [address] [place], [place] [address] Phone: [phone] Subjective:   Jeremy [name], am serving as a scribe for Dr. Hulan Saas.    CC: Left knee pain  [id number]   08/04/2018 Degenerative joint joint disease.  Failed all conservative therapy.  Monovisc given today.  Discussed icing regimen and home exercise.  Discussed how this can take up to 4 weeks to improve.  Patient does well can follow-up as needed but will have scheduled follow-up in 2 months.  Update 11/29/2018 Jeremy [name] is a 78 y.o. male coming in with complaint of left knee pain. Patient states that his pain has been increasing and bothering him more than it ever has. Trouble sleeping at night. Hard to walk in the morning. Is improving this morning.      Past Medical History:  Diagnosis Date  . AK (actinic keratosis)   . Allergic rhinitis   . History of colonic polyps    Dr. Ardis Hughs- Due 2018  . History of skin cancer   . Osteopenia   . Prostate cancer ([place])    hx of s/p XRT, seeds Dr. Risa Grill   Past Surgical History:  Procedure Laterality Date  . INSERTION PROSTATE RADIATION SEED    . KNEE ARTHROSCOPY Right   . KNEE SURGERY Left   . TONSILLECTOMY    . XRT  2008   Social History   Socioeconomic History  . Marital status: Married    Spouse name: Not on file  . Number of children: 2  . Years of education: Not on file  . Highest education level: Not on file  Occupational History  . Occupation: retired from [employer]  Social Needs  . Financial resource strain: Not on file  . Food insecurity    Worry: Not on file    Inability: Not on file  . Transportation needs    Medical: Not on file    Non-medical: Not on file  Tobacco Use  . Smoking status: Current Some Day Smoker    Types: Cigars  . Smokeless tobacco: Never Used  . Tobacco comment: 2 cigars a month  Substance and Sexual Activity  . Alcohol use: Yes    Alcohol/week: 2.0 standard drinks   Types: 2 Cans of beer per week  . Drug use: [name]  . Sexual activity: Yes  Lifestyle  . Physical activity    Days per week: Not on file    Minutes per session: Not on file  . Stress: Not on file  Relationships  . Social Herbalist on phone: Not on file    Gets together: Not on file    Attends religious service: Not on file    Active member of club or organization: Not on file    Attends meetings of clubs or organizations: Not on file    Relationship status: Not on file  Other Topics Concern  . Not on file  Social History Narrative  . Not on file   Allergies  Allergen Reactions  . Aspirin     REACTION: gi   Family History  Problem Relation Age of Onset  . Liver disease Father   . Cancer Father        pancreas ?  Marland Kitchen Cancer - Other Other        intestinal cancer      Current Outpatient Medications (Respiratory):  .  azelastine (ASTELIN) 0.1 % nasal spray, Use two sprays in each nostril  twice daily as needed for runny nose and sneezing. .  budesonide (RHINOCORT ALLERGY) 32 MCG/ACT nasal spray, Place 1 spray into both nostrils as needed. Reported on 02/26/2015 .  loratadine-pseudoephedrine (CLARITIN-D 24-HOUR) 10-240 MG 24 hr tablet, Take 1 tablet by mouth daily. .  mometasone (NASONEX) 50 MCG/ACT nasal spray, Use 2 sprays in each nostril every night .  montelukast (SINGULAIR) 10 MG tablet, Take 1 tablet (10 mg total) by mouth at bedtime.  Current Outpatient Medications (Analgesics):  Marland Kitchen  Acetaminophen (APAP 500 PO), Take 1 tablet by mouth 2 (two) times daily. Reported on 02/26/2015   Current Outpatient Medications (Other):  Marland Kitchen  Omega-3 Fatty Acids (FISH OIL) 1000 MG CAPS, Take 2 capsules by mouth daily.     Past medical history, social, surgical and family history all reviewed in electronic medical record.  [name] pertanent information unless stated regarding to the chief complaint.   Review of Systems:  [name] headache, visual changes, nausea, vomiting, diarrhea,  constipation, dizziness, abdominal pain, skin rash, fevers, chills, night sweats, weight loss, swollen lymph nodes,  chest pain, shortness of breath, mood changes.  Positive muscle aches, body aches, joint swelling  Objective  Blood pressure 130/82, pulse 87, height 5\' 6"  (1.676 m), weight 180 lb (81.6 kg), SpO2 96 %.    General: [name] apparent distress alert and oriented x3 mood and affect normal, dressed appropriately.  HEENT: Pupils equal, extraocular movements intact  Respiratory: Patient's speak in full sentences and does not appear short of breath  Cardiovascular: [name] lower extremity edema, non tender, [name] erythema  Skin: Warm dry intact with [name] signs of infection or rash on extremities or on axial skeleton.  Abdomen: Soft nontender  Neuro: Cranial nerves II through XII are intact, neurovascularly intact in all extremities with 2+ DTRs and 2+ pulses.  Lymph: [name] lymphadenopathy of posterior or anterior cervical chain or axillae bilaterally.  Gait antalgic gait.  MSK:  tender with limited range of motion and good stability and symmetric strength and tone of shoulders, elbows, wrist, hip, and ankles bilaterally.  Moderate arthritic changes of multiple joints Left knee exam does have mild valgus deformity.  Effusion noted.  Patient is tender to palpation diffusely more over the medial joint line and the patellofemoral joint.      After informed written and verbal consent, patient was seated on exam table. Left knee was prepped with alcohol swab and utilizing anterolateral approach, patient's left knee space was injected with 4:1  marcaine 0.5%: Kenalog 40mg /dL. Patient tolerated the procedure well without immediate complications. Impression and Recommendations:     This case required medical decision making of moderate complexity. The above documentation has been reviewed and is accurate and complete Lyndal Pulley, DO       Note: This dictation was prepared with Dragon dictation along  with smaller phrase technology. Any transcriptional errors that result from this process are unintentional.

## 2018-12-29 DIAGNOSIS — H524 Presbyopia: Secondary | ICD-10-CM | POA: Diagnosis not present

## 2019-01-26 ENCOUNTER — Ambulatory Visit: Payer: Medicare HMO | Admitting: Family Medicine

## 2019-02-06 ENCOUNTER — Ambulatory Visit: Payer: Medicare HMO | Attending: Internal Medicine

## 2019-02-06 DIAGNOSIS — Z23 Encounter for immunization: Secondary | ICD-10-CM

## 2019-02-06 NOTE — Progress Notes (Signed)
   Covid-19 Vaccination Clinic  Name:  Jeremy [name]    MRN: [id number] DOB: [birth year]  02/06/2019  [name] was observed post Covid-19 immunization for 15 minutes without incidence. He was provided with Vaccine Information Sheet and instruction to access the V-Safe system.   [name] was instructed to call 911 with any severe reactions post vaccine: Marland Kitchen Difficulty breathing  . Swelling of your face and throat  . A fast heartbeat  . A bad rash all over your body  . Dizziness and weakness

## 2019-02-23 DIAGNOSIS — R69 Illness, unspecified: Secondary | ICD-10-CM | POA: Diagnosis not present

## 2019-02-27 ENCOUNTER — Ambulatory Visit: Payer: Medicare HMO | Attending: Internal Medicine

## 2019-02-27 DIAGNOSIS — Z23 Encounter for immunization: Secondary | ICD-10-CM

## 2019-02-27 NOTE — Progress Notes (Signed)
   Covid-19 Vaccination Clinic  Name:  Jeremy [name]    MRN: [id number] DOB: [birth year]  02/27/2019  [name] was observed post Covid-19 immunization for 15 minutes without incidence. He was provided with Vaccine Information Sheet and instruction to access the V-Safe system.   [name] was instructed to call 911 with any severe reactions post vaccine: Marland Kitchen Difficulty breathing  . Swelling of your face and throat  . A fast heartbeat  . A bad rash all over your body  . Dizziness and weakness    Immunizations Administered    Name Date Dose VIS Date Route   Pfizer COVID-19 Vaccine 02/27/2019  8:55 AM 0.3 mL 12/23/2018 Intramuscular   Manufacturer: [place]   Lot: [id number]   [place]: [id number]

## 2019-03-15 ENCOUNTER — Telehealth: Payer: Self-pay | Admitting: Allergy and Immunology

## 2019-03-15 ENCOUNTER — Ambulatory Visit (INDEPENDENT_AMBULATORY_CARE_PROVIDER_SITE_OTHER): Payer: Medicare HMO | Admitting: Allergy and Immunology

## 2019-03-15 ENCOUNTER — Other Ambulatory Visit: Payer: Self-pay

## 2019-03-15 ENCOUNTER — Encounter: Payer: Self-pay | Admitting: Allergy and Immunology

## 2019-03-15 VITALS — BP 118/72 | HR 66 | Temp 98.1°F | Resp 18 | Ht 67.0 in | Wt 179.8 lb

## 2019-03-15 DIAGNOSIS — J3089 Other allergic rhinitis: Secondary | ICD-10-CM

## 2019-03-15 DIAGNOSIS — H6123 Impacted cerumen, bilateral: Secondary | ICD-10-CM

## 2019-03-15 MED ORDER — METHYLPREDNISOLONE ACETATE 80 MG/ML IJ SUSP
80.0000 mg | Freq: Once | INTRAMUSCULAR | Status: AC
  Administered 2019-03-15: 80 mg via INTRAMUSCULAR

## 2019-03-15 NOTE — Telephone Encounter (Signed)
Jeremy [name] has been referred to  [facility] [address] [place], [place] [address]  [phone]  They will call patient to schedule.

## 2019-03-15 NOTE — Patient Instructions (Addendum)
  1. Every night use the following during periods of upper airway symptoms:   A.  Mometasone two sprays each nostril   B. Azelastine 2 sprays each nostril  C. Montelukast 10 mg tablet  2. If needed:   A. over-the-counter antihistamine  B. Over the counter nasal saline  3.  Depo-Medrol 80 IM delivered in clinic today  4. Visit with ENT about ear impaction.  5. Return to clinic in 1 year or earlier if problem

## 2019-03-15 NOTE — Progress Notes (Signed)
- [facility] [place]   Follow-up Note  Referring Provider: Cassandria Anger, MD Primary Provider: Cassandria Anger, MD Date of Office Visit: 03/15/2019  Subjective:   Jeremy [name] (DOB: 1940/07/05) is a 79 y.o. male who returns to the [facility] on 03/15/2019 in re-evaluation of the following:  HPI: Jeremy [name] returns to this clinic in reevaluation of his allergic rhinitis.  He was last seen in this clinic on 06 September 2018.  He was really doing quite well and tapered off all of his medications for several months but unfortunately about 2 weeks ago he developed sneezing and runny nose and his head got stopped up and now his hearing is significantly decreased.  He does not have any associated fever or anosmia or ugly nasal discharge.  He did attempt to wash out his ears with an ear wash kit but this did not help.  He has not been around anyone who has been sick.  He has received both Covid vaccinations and a flu vaccine this year.  Allergies as of 03/15/2019      Reactions   Aspirin    REACTION: gi      Medication List      APAP 500 PO Take 1 tablet by mouth 2 (two) times daily. Reported on 02/26/2015   azelastine 0.1 % nasal spray Commonly known as: ASTELIN Use two sprays in each nostril twice daily as needed for runny nose and sneezing.   Fish Oil 1000 MG Caps Take 2 capsules by mouth daily.   loratadine-pseudoephedrine 10-240 MG 24 hr tablet Commonly known as: CLARITIN-D 24-hour Take 1 tablet by mouth daily.   mometasone 50 MCG/ACT nasal spray Commonly known as: NASONEX Use 2 sprays in each nostril every night   montelukast 10 MG tablet Commonly known as: SINGULAIR Take 1 tablet (10 mg total) by mouth at bedtime.   Rhinocort Allergy 32 MCG/ACT nasal spray Generic drug: budesonide Place 1 spray into both nostrils as needed. Reported on 02/26/2015       Past Medical History:  Diagnosis Date  . AK  (actinic keratosis)   . Allergic rhinitis   . History of colonic polyps    Dr. Ardis Hughs- Due 2018  . History of skin cancer   . Osteopenia   . Prostate cancer ([place])    hx of s/p XRT, seeds Dr. Risa Grill    Past Surgical History:  Procedure Laterality Date  . INSERTION PROSTATE RADIATION SEED    . KNEE ARTHROSCOPY Right   . KNEE SURGERY Left   . TONSILLECTOMY    . XRT  2008    Review of systems negative except as noted in HPI / PMHx or noted below:  Review of Systems  Constitutional: Negative.   HENT: Negative.   Eyes: Negative.   Respiratory: Negative.   Cardiovascular: Negative.   Gastrointestinal: Negative.   Genitourinary: Negative.   Musculoskeletal: Negative.   Skin: Negative.   Neurological: Negative.   Endo/Heme/Allergies: Negative.   Psychiatric/Behavioral: Negative.      Objective:   Vitals:   03/15/19 1109  BP: 118/72  Pulse: 66  Resp: 18  Temp: 98.1 F (36.7 C)   Height: 5' 7"  (170.2 cm)  Weight: 179 lb 12.8 oz (81.6 kg)   Physical Exam Constitutional:      Appearance: He is not diaphoretic.  HENT:     Head: Normocephalic.     Right Ear: Tympanic membrane, ear canal and external  ear normal. There is impacted cerumen.     Left Ear: Tympanic membrane, ear canal and external ear normal. There is impacted cerumen.     Nose: Nose normal. No mucosal edema or rhinorrhea.     Mouth/Throat:     Pharynx: Uvula midline. No oropharyngeal exudate.  Eyes:     Conjunctiva/sclera: Conjunctivae normal.  Neck:     Thyroid: No thyromegaly.     Trachea: Trachea normal. No tracheal tenderness or tracheal deviation.  Cardiovascular:     Rate and Rhythm: Normal rate and regular rhythm.     Heart sounds: Normal heart sounds, S1 normal and S2 normal. No murmur.  Pulmonary:     Effort: No respiratory distress.     Breath sounds: Normal breath sounds. No stridor. No wheezing or rales.  Lymphadenopathy:     Head:     Right side of head: No tonsillar adenopathy.      Left side of head: No tonsillar adenopathy.     Cervical: No cervical adenopathy.  Skin:    Findings: No erythema or rash.     Nails: There is no clubbing.  Neurological:     Mental Status: He is alert.     Diagnostics: none  Assessment and Plan:   1. Other allergic rhinitis   2. 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 Every night use the following during periods of upper airway symptoms:   A.  Mometasone two sprays each nostril   B. Azelastine 2 sprays each nostril  C. Montelukast 10 mg tablet  2. If needed:   A. over-the-counter antihistamine  B. Over the counter nasal saline  3.  Depo-Medrol 80 IM delivered in clinic today  4. Visit with ENT about ear impaction.  5. Return to clinic in 1 year or earlier if problem  Jeremy [name] appears to have some form of inflammation affecting his airway for which we will give him a systemic steroid as noted above and assume he will do well with intermittent use of other anti-inflammatory agents for his airway as he has been utilizing over the course of the past 6 months or so.  He does appear to have impacted wax in both ears.  I have informed Jeremy [name] that should his ear sensation and hearing deficit not improve with the systemic steroid then he probably needs to visit with an ENT doctor to have that wax vacuumed out of his external auditory canal.  Assuming he does well I can see him in his clinic in 1 year or earlier if there is a problem.  Allena Katz, MD Allergy / [facility]ology [place]

## 2019-03-16 ENCOUNTER — Encounter: Payer: Self-pay | Admitting: Allergy and Immunology

## 2019-03-23 DIAGNOSIS — R69 Illness, unspecified: Secondary | ICD-10-CM | POA: Diagnosis not present

## 2019-03-23 DIAGNOSIS — H6123 Impacted cerumen, bilateral: Secondary | ICD-10-CM | POA: Diagnosis not present

## 2019-03-23 DIAGNOSIS — H60532 Acute contact otitis externa, left ear: Secondary | ICD-10-CM | POA: Diagnosis not present

## 2019-03-30 DIAGNOSIS — H6122 Impacted cerumen, left ear: Secondary | ICD-10-CM | POA: Insufficient documentation

## 2019-03-30 DIAGNOSIS — H938X2 Other specified disorders of left ear: Secondary | ICD-10-CM | POA: Diagnosis not present

## 2019-04-04 DIAGNOSIS — D225 Melanocytic nevi of trunk: Secondary | ICD-10-CM | POA: Diagnosis not present

## 2019-04-04 DIAGNOSIS — L570 Actinic keratosis: Secondary | ICD-10-CM | POA: Diagnosis not present

## 2019-04-04 DIAGNOSIS — L905 Scar conditions and fibrosis of skin: Secondary | ICD-10-CM | POA: Diagnosis not present

## 2019-04-04 DIAGNOSIS — L814 Other melanin hyperpigmentation: Secondary | ICD-10-CM | POA: Diagnosis not present

## 2019-04-04 DIAGNOSIS — L578 Other skin changes due to chronic exposure to nonionizing radiation: Secondary | ICD-10-CM | POA: Diagnosis not present

## 2019-04-04 DIAGNOSIS — L821 Other seborrheic keratosis: Secondary | ICD-10-CM | POA: Diagnosis not present

## 2019-04-04 DIAGNOSIS — D1801 Hemangioma of skin and subcutaneous tissue: Secondary | ICD-10-CM | POA: Diagnosis not present

## 2019-04-04 DIAGNOSIS — Z85828 Personal history of other malignant neoplasm of skin: Secondary | ICD-10-CM | POA: Diagnosis not present

## 2019-04-13 DIAGNOSIS — R69 Illness, unspecified: Secondary | ICD-10-CM | POA: Diagnosis not present

## 2019-04-21 ENCOUNTER — Other Ambulatory Visit: Payer: Self-pay

## 2019-04-21 ENCOUNTER — Encounter (HOSPITAL_BASED_OUTPATIENT_CLINIC_OR_DEPARTMENT_OTHER): Payer: Self-pay

## 2019-04-21 ENCOUNTER — Emergency Department (HOSPITAL_BASED_OUTPATIENT_CLINIC_OR_DEPARTMENT_OTHER)
Admission: EM | Admit: 2019-04-21 | Discharge: 2019-04-21 | Disposition: A | Discharge status: Home / Self Care | Payer: Medicare HMO | Attending: Emergency Medicine | Admitting: Emergency Medicine

## 2019-04-21 DIAGNOSIS — Y999 Unspecified external cause status: Secondary | ICD-10-CM | POA: Diagnosis not present

## 2019-04-21 DIAGNOSIS — T310 Burns involving less than 10% of body surface: Secondary | ICD-10-CM

## 2019-04-21 DIAGNOSIS — T23202A Burn of second degree of left hand, unspecified site, initial encounter: Secondary | ICD-10-CM | POA: Insufficient documentation

## 2019-04-21 DIAGNOSIS — T22212A Burn of second degree of left forearm, initial encounter: Secondary | ICD-10-CM | POA: Insufficient documentation

## 2019-04-21 DIAGNOSIS — F17290 Nicotine dependence, other tobacco product, uncomplicated: Secondary | ICD-10-CM | POA: Insufficient documentation

## 2019-04-21 DIAGNOSIS — X010XXA Exposure to flames in uncontrolled fire, not in building or structure, initial encounter: Secondary | ICD-10-CM | POA: Diagnosis not present

## 2019-04-21 DIAGNOSIS — R69 Illness, unspecified: Secondary | ICD-10-CM | POA: Diagnosis not present

## 2019-04-21 DIAGNOSIS — Y9389 Activity, other specified: Secondary | ICD-10-CM | POA: Insufficient documentation

## 2019-04-21 DIAGNOSIS — T22012A Burn of unspecified degree of left forearm, initial encounter: Secondary | ICD-10-CM | POA: Diagnosis present

## 2019-04-21 DIAGNOSIS — Y929 Unspecified place or not applicable: Secondary | ICD-10-CM | POA: Insufficient documentation

## 2019-04-21 MED ORDER — SILVER SULFADIAZINE 1 % EX CREA: 1.0000 "application " | TOPICAL_CREAM | Freq: Two times a day (BID) | CUTANEOUS | 0 refills | Status: DC

## 2019-04-21 MED ORDER — SILVER SULFADIAZINE 1 % EX CREA
TOPICAL_CREAM | Freq: Once | CUTANEOUS | Status: AC
  Filled 2019-04-21: qty 85

## 2019-04-21 NOTE — ED Notes (Signed)
Silverdine applied to left arm and hand with top dressing.
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
## 2019-04-24 ENCOUNTER — Other Ambulatory Visit: Payer: Self-pay

## 2019-04-24 ENCOUNTER — Ambulatory Visit: Payer: Medicare HMO | Admitting: Internal Medicine

## 2019-04-24 DIAGNOSIS — T2220XA Burn of second degree of shoulder and upper limb, except wrist and hand, unspecified site, initial encounter: Secondary | ICD-10-CM

## 2019-04-24 NOTE — Patient Instructions (Addendum)
Jeremy [name],  It was a pleasure meeting you today.    Please follow the directions below:  Please change your dressing every day  1) put silvadene on the arm and hand 2) followed by a non stick pad on the arm and hand 3) wrap with gauze 4) and lastly put an ace bandage   Please follow up in one week
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
## 2019-04-25 ENCOUNTER — Telehealth: Payer: Self-pay

## 2019-04-25 DIAGNOSIS — T2220XA Burn of second degree of shoulder and upper limb, except wrist and hand, unspecified site, initial encounter: Secondary | ICD-10-CM | POA: Diagnosis not present

## 2019-04-25 NOTE — Telephone Encounter (Signed)
Faxed medical supplies for non stick pads, kerlix/rolls, xeroform and normal saline

## 2019-05-01 ENCOUNTER — Encounter: Payer: Self-pay | Admitting: Internal Medicine

## 2019-05-01 ENCOUNTER — Ambulatory Visit: Payer: Medicare HMO | Admitting: Internal Medicine

## 2019-05-01 ENCOUNTER — Other Ambulatory Visit: Payer: Self-pay

## 2019-05-01 VITALS — BP 142/81 | HR 75 | Temp 96.8°F | Ht 67.0 in | Wt 179.0 lb

## 2019-05-01 DIAGNOSIS — T2220XA Burn of second degree of shoulder and upper limb, except wrist and hand, unspecified site, initial encounter: Secondary | ICD-10-CM
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
## 2019-05-04 ENCOUNTER — Encounter: Payer: Self-pay | Admitting: Internal Medicine

## 2019-05-04 ENCOUNTER — Other Ambulatory Visit: Payer: Self-pay

## 2019-05-04 ENCOUNTER — Ambulatory Visit (INDEPENDENT_AMBULATORY_CARE_PROVIDER_SITE_OTHER): Payer: Medicare HMO | Admitting: Internal Medicine

## 2019-05-04 VITALS — BP 165/75 | HR 63 | Temp 96.8°F | Ht 67.0 in | Wt 179.0 lb

## 2019-05-04 DIAGNOSIS — T2220XA Burn of second degree of shoulder and upper limb, except wrist and hand, unspecified site, initial encounter: Secondary | ICD-10-CM

## 2019-05-04 NOTE — Patient Instructions (Signed)
Happy Birthday!  It was a pleasure seeing you today.  For the burn please follow the instructions below until I see you Monday  Arm:  1. In the morning place lubricant over the mesh 2.  Cover with a non stick pad 3. Wrap with a guaze  4. Wrap with ace bandage 5. Repeat at night time  For the hand:  1: put xeroform on the hand (if you don't have then just put vaseline) 2. Cover with gauze
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
## 2019-05-08 ENCOUNTER — Other Ambulatory Visit: Payer: Self-pay

## 2019-05-08 ENCOUNTER — Encounter: Payer: Self-pay | Admitting: Internal Medicine

## 2019-05-08 ENCOUNTER — Ambulatory Visit (INDEPENDENT_AMBULATORY_CARE_PROVIDER_SITE_OTHER): Payer: Medicare HMO | Admitting: Internal Medicine

## 2019-05-08 VITALS — BP 134/75 | HR 86 | Temp 97.1°F | Ht 67.0 in | Wt 179.0 lb

## 2019-05-08 DIAGNOSIS — T2220XA Burn of second degree of shoulder and upper limb, except wrist and hand, unspecified site, initial encounter: Secondary | ICD-10-CM

## 2019-05-08 MED ORDER — COLLAGENASE 250 UNIT/GM EX OINT: 1.0000 "application " | TOPICAL_OINTMENT | Freq: Every day | CUTANEOUS | 0 refills | Status: DC

## 2019-05-08 NOTE — Patient Instructions (Signed)
Jeremy [name],  It was a pleasure seeing you today.  Please start using Santyl ointment on your wound in the morning and in the evening.  Please follow the instructions below:  1) on first dressing change - remove mesh and clean with normal saline 2) put the santyl ointment over the entire wound 3) cover with non-stick pad 4) cover with gauze 5) cover with ace bandage  Repeat dressing changes as necessary- always apply santyl with each dressing change  Please call our office with any questions or concerns Please follow up in one week
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
## 2019-05-15 ENCOUNTER — Ambulatory Visit (INDEPENDENT_AMBULATORY_CARE_PROVIDER_SITE_OTHER): Payer: Medicare HMO | Admitting: Internal Medicine

## 2019-05-15 ENCOUNTER — Other Ambulatory Visit: Payer: Self-pay

## 2019-05-15 ENCOUNTER — Telehealth: Payer: Self-pay

## 2019-05-15 DIAGNOSIS — T2220XA Burn of second degree of shoulder and upper limb, except wrist and hand, unspecified site, initial encounter: Secondary | ICD-10-CM | POA: Diagnosis not present

## 2019-05-15 NOTE — Patient Instructions (Signed)
Mr. Fragoza,  It was a pleasure seeing you today.  The wound has improved nicely.  Please follow the instructions below  1) Place xeroform completely on the wound (yellow gauze on the wound) 2) use santyl on the third of the wound that continues to have dead tissue as discussed today. 3) Place the non stick pad followed by the gauze and finally the ace bandage 4) please change 2 times a day

## 2019-05-15 NOTE — Telephone Encounter (Signed)
Called [facility], spoke with [employer]. Advised  the patient is running out of some products before the next shipment. He is using 2 nonstick pads and not one to cover the wound area. She indicated that his insurance only pays for one item per day. They have been sending out 5x9s of the non stick pads. They changed the size to 8x10 and sending product out today.
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
## 2019-05-18 ENCOUNTER — Encounter: Payer: Self-pay | Admitting: Internal Medicine

## 2019-05-18 ENCOUNTER — Other Ambulatory Visit: Payer: Self-pay

## 2019-05-18 ENCOUNTER — Ambulatory Visit (INDEPENDENT_AMBULATORY_CARE_PROVIDER_SITE_OTHER): Payer: Medicare HMO | Admitting: Internal Medicine

## 2019-05-18 VITALS — BP 137/55 | HR 75 | Temp 97.5°F | Ht 67.0 in | Wt 179.0 lb

## 2019-05-18 DIAGNOSIS — T2220XA Burn of second degree of shoulder and upper limb, except wrist and hand, unspecified site, initial encounter: Secondary | ICD-10-CM

## 2019-05-18 DIAGNOSIS — Z719 Counseling, unspecified: Secondary | ICD-10-CM

## 2019-05-18 NOTE — Patient Instructions (Addendum)
Jeremy [name],  It was a pleasure seeing you today.  Please continue to use xeroform on the wound with daily dressing changes (yellow gauze)  Once that runs out start using vaseline.  1) clean the area 2) place vaseline on the wound 3) continue to use santyl on the outer side 4) cover with large non-stick pad 5) cover with gauze 6) finally cover with an ace bandage  We will see you in 2 weeks.  Enjoy your golfing trip!
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
## 2019-05-22 ENCOUNTER — Ambulatory Visit: Payer: Medicare HMO | Admitting: Internal Medicine

## 2019-05-29 ENCOUNTER — Ambulatory Visit (INDEPENDENT_AMBULATORY_CARE_PROVIDER_SITE_OTHER): Payer: Medicare HMO | Admitting: Internal Medicine

## 2019-05-29 ENCOUNTER — Encounter: Payer: Self-pay | Admitting: Internal Medicine

## 2019-05-29 ENCOUNTER — Other Ambulatory Visit: Payer: Self-pay

## 2019-05-29 VITALS — BP 129/74 | HR 70 | Temp 97.1°F | Ht 67.0 in | Wt 179.0 lb

## 2019-05-29 DIAGNOSIS — J301 Allergic rhinitis due to pollen: Secondary | ICD-10-CM | POA: Diagnosis not present

## 2019-05-29 DIAGNOSIS — E785 Hyperlipidemia, unspecified: Secondary | ICD-10-CM

## 2019-05-29 DIAGNOSIS — T2220XA Burn of second degree of shoulder and upper limb, except wrist and hand, unspecified site, initial encounter: Secondary | ICD-10-CM | POA: Diagnosis not present

## 2019-05-29 DIAGNOSIS — T22292S Burn of second degree of multiple sites of left shoulder and upper limb, except wrist and hand, sequela: Secondary | ICD-10-CM | POA: Diagnosis not present

## 2019-05-29 DIAGNOSIS — T22299A Burn of second degree of multiple sites of unspecified shoulder and upper limb, except wrist and hand, initial encounter: Secondary | ICD-10-CM | POA: Insufficient documentation

## 2019-05-29 LAB — BASIC METABOLIC PANEL
BUN: 19 mg/dL (ref 6–23)
CO2: 27 mEq/L (ref 19–32)
Calcium: 8.6 mg/dL (ref 8.4–10.5)
Chloride: 107 mEq/L (ref 96–112)
Creatinine, Ser: 0.83 mg/dL (ref 0.40–1.50)
GFR: 89.36 mL/min (ref >60.00)
Glucose, Bld: 101 mg/dL — ABNORMAL HIGH (ref 70–99)
Potassium: 3.8 mEq/L (ref 3.5–5.1)
Sodium: 139 mEq/L (ref 135–145)

## 2019-05-29 NOTE — Assessment & Plan Note (Signed)
Seasonal claritin
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
## 2019-05-29 NOTE — Assessment & Plan Note (Signed)
Dressing changes Plastic surgery f/u

## 2019-05-29 NOTE — Assessment & Plan Note (Signed)
No CP 
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
## 2019-06-05 DIAGNOSIS — L570 Actinic keratosis: Secondary | ICD-10-CM | POA: Diagnosis not present

## 2019-06-08 ENCOUNTER — Ambulatory Visit (INDEPENDENT_AMBULATORY_CARE_PROVIDER_SITE_OTHER): Payer: Medicare HMO | Admitting: Internal Medicine

## 2019-06-08 ENCOUNTER — Other Ambulatory Visit: Payer: Self-pay

## 2019-06-08 ENCOUNTER — Encounter: Payer: Self-pay | Admitting: Internal Medicine

## 2019-06-08 VITALS — BP 147/66 | HR 77 | Temp 97.5°F | Ht 67.0 in | Wt 177.0 lb

## 2019-06-08 DIAGNOSIS — T2220XA Burn of second degree of shoulder and upper limb, except wrist and hand, unspecified site, initial encounter: Secondary | ICD-10-CM

## 2019-06-08 NOTE — Progress Notes (Signed)
   Subjective:     Patient ID: Jeremy [name], male    DOB: [birth year], 79 y.o.   MRN: [id number]  Chief Complaint  Patient presents with  . Follow-up    1-2 weeks for deep partial thickness burn of upper extremity    HPI: The patient is a 79 y.o. male here for follow-up for left arm and hand burn  Patient reports improvement in the wound.  He continues to use Xeroform daily with dressing changes.  He he no longer uses Santyl as this has run out.  Overall he is pleased with the progress.  He denies acute pain, increased warmth or redness to the area or increased drainage.  Review of Systems  All other systems reviewed and are negative.    has a past medical history of AK (actinic keratosis), Allergic rhinitis, History of colonic polyps, History of skin cancer, Osteopenia, and Prostate cancer ([place]).  has a past surgical history that includes XRT (2008); Insertion prostate radiation seed; Knee arthroscopy (Right); Knee surgery (Left); and Tonsillectomy.  reports that he has been smoking cigars. He has never used smokeless tobacco. Objective:   Vital Signs BP (!) 147/66 (BP Location: Right Arm, Patient Position: Sitting, Cuff Size: Normal)   Pulse 77   Temp (!) 97.5 F (36.4 C) (Temporal)   Ht 5\' 7"  (1.702 m)   Wt 177 lb (80.3 kg)   SpO2 99%   BMI 27.72 kg/m  Vital Signs and Nursing Note Reviewed Physical Exam  Skin:  Wound almost entirely closed Granulation tissue and open areas Epithelialized tissue throughout       Assessment/Plan:     ICD-10-CM   1. Deep partial thickness burn of upper extremity  T22.20XA    Assessment: Deep partial-thickness burn of the left arm and hand  The wound has healed very nicely.  It is almost completely closed.  There are some scattered areas that are open but these are healing well.  I recommended he continue to use Xeroform daily with dressing changes.   The hand wound has completely healed and I recommended he start skinuva twice  daily to help with scarring  He is going on a golfing trip and will return in about 2 to 3 weeks.  I will see him then.  Plan -In office dressing changes with Xeroform, ABD, Kerlix and Ace -Continue using xeroform for daily   Boyd Kerbs, DO 06/08/2019, 2:55 PM

## 2019-06-08 NOTE — Patient Instructions (Signed)
Jeremy [name] It was a pleasure seeing you today.  Please follow the instructions below for your wound care  1) Xeroform dressing changes daily 2) followed by non stick pad 3) wrap with gauze 4) cover with ace bandage   Please follow up with me in after your trip.  Call us at [phone] with any questions or concerns  [employer] is a medical supply company.  We have sent them an order for your supplies.  Please contact them if you do not receive your supplies in the next 72hrs.  Their number is [phone] and website is [email or web address]

## 2019-06-09 ENCOUNTER — Telehealth: Payer: Self-pay | Admitting: *Deleted

## 2019-06-09 NOTE — Telephone Encounter (Signed)
Called [facility] on (06/08/19) and spoke with Wells Guiles to see if the patient's supplies could be reordered.  She checked the patient's insurance and was able to reorder all the recent supplies.  Wells Guiles stated that they will get the order sent out to the patient.//AB/CMA

## 2019-06-26 ENCOUNTER — Encounter: Payer: Self-pay | Admitting: Internal Medicine

## 2019-06-26 ENCOUNTER — Ambulatory Visit: Payer: Medicare HMO | Admitting: Internal Medicine

## 2019-06-26 ENCOUNTER — Ambulatory Visit (INDEPENDENT_AMBULATORY_CARE_PROVIDER_SITE_OTHER): Payer: Medicare HMO | Admitting: Internal Medicine

## 2019-06-26 ENCOUNTER — Other Ambulatory Visit: Payer: Self-pay

## 2019-06-26 VITALS — BP 114/72 | HR 86 | Temp 97.1°F

## 2019-06-26 DIAGNOSIS — T2220XA Burn of second degree of shoulder and upper limb, except wrist and hand, unspecified site, initial encounter: Secondary | ICD-10-CM

## 2019-06-26 NOTE — Progress Notes (Signed)
   Subjective:     Patient ID: Jeremy [name], male    DOB: [birth year], 79 y.o.   MRN: [id number]  Chief Complaint  Patient presents with  . Follow-up    HPI: The patient is a 79 y.o. male here for follow-up for left arm and hand burn  Patient continues to use xeroform daily with dressing changes.  He is pleased with the overall appearance of the wound and healing process.  He does not have any concerns today.  He was able to go golfing with friends in [place] in past couple weeks.  He denies acute pain, increased warmth or redness to the area or increased drainage.  Review of Systems  All other systems reviewed and are negative.    has a past medical history of AK (actinic keratosis), Allergic rhinitis, History of colonic polyps, History of skin cancer, Osteopenia, and Prostate cancer ([place]).  has a past surgical history that includes XRT (2008); Insertion prostate radiation seed; Knee arthroscopy (Right); Knee surgery (Left); and Tonsillectomy.  reports that he has been smoking cigars. He has never used smokeless tobacco. Objective:   Vital Signs BP 114/72 (BP Location: Right Arm, Patient Position: Sitting, Cuff Size: Large)   Pulse 86   Temp (!) 97.1 F (36.2 C) (Temporal)   SpO2 95%  Vital Signs and Nursing Note Reviewed Physical Exam Skin:    Comments: Epithelialized tissue throughout on left arm No drainage Thin scar tissue         Assessment/Plan:     ICD-10-CM   1. Deep partial thickness burn of upper extremity  T22.20XA    Assessment: Deep partial-thickness burn of the left arm and hand  The wound has healed nicely.  It is closed throughout with epithelialized tissue.  Skin is still thin and I recommended one more week of xeroform to help with the integrity of the wound healing.  I will see him back in one week and at that time will likely recommend just using skinuva and suncreen.    Plan -In office dressing changeswithXeroform, ABD, Kerlix and  Ace -Continue usingxeroform daily with dressing changes - follow up in one week   Boyd Kerbs, DO 06/26/2019, 2:26 PM

## 2019-07-03 ENCOUNTER — Ambulatory Visit (INDEPENDENT_AMBULATORY_CARE_PROVIDER_SITE_OTHER): Payer: Medicare HMO | Admitting: Internal Medicine

## 2019-07-03 ENCOUNTER — Encounter: Payer: Self-pay | Admitting: Internal Medicine

## 2019-07-03 ENCOUNTER — Other Ambulatory Visit: Payer: Self-pay

## 2019-07-03 VITALS — BP 113/71 | HR 73 | Temp 97.3°F | Ht 67.0 in | Wt 177.0 lb

## 2019-07-03 DIAGNOSIS — T2220XA Burn of second degree of shoulder and upper limb, except wrist and hand, unspecified site, initial encounter: Secondary | ICD-10-CM

## 2019-07-03 NOTE — Progress Notes (Signed)
   Subjective:     Patient ID: Jeremy [name], male    DOB: [birth year], 79 y.o.   MRN: [id number]  Chief Complaint  Patient presents with  . Follow-up    HPI: The patient is a 79 y.o. male here for follow-up for left arm and hand burn  Since last clinic visit patient has been using xeroform daily on the wound.  He is happy with the results of the wound healing.  He has no complaints today.  He denies acute pain, increased warmth or redness to the area or drainage.  Review of Systems  All other systems reviewed and are negative.    has a past medical history of AK (actinic keratosis), Allergic rhinitis, History of colonic polyps, History of skin cancer, Osteopenia, and Prostate cancer ([place]).  has a past surgical history that includes XRT (2008); Insertion prostate radiation seed; Knee arthroscopy (Right); Knee surgery (Left); and Tonsillectomy.  reports that he has been smoking cigars. He has never used smokeless tobacco. Objective:   Vital Signs BP 113/71 (BP Location: Right Arm, Patient Position: Sitting, Cuff Size: Large)   Pulse 73   Temp (!) 97.3 F (36.3 C) (Temporal)   Ht 5\' 7"  (1.702 m)   Wt 177 lb (80.3 kg)   SpO2 98%   BMI 27.72 kg/m  Vital Signs and Nursing Note Reviewed Physical Exam Skin:    Comments: Epithelialized tissue throughout on left arm and hand No drainage        Assessment/Plan:     ICD-10-CM   1. Deep partial thickness burn of upper extremity  T22.20XA    Assessment: Deep partial-thickness burn of the left arm and hand  The wound has healed nicely and continues to improve in appearance.  I recommended he start skinuva twice daily to help with scarring.  I also recommended he use sunscreen when he goes outside, especially when he golfs.  And use lotion or vaseline daily.  He no longer needs dressing changes.  Plan -skinuva twice daily -sunscreen -vaseline/lotion daily  Boyd Kerbs, DO 07/03/2019, 2:46 PM

## 2019-07-17 DIAGNOSIS — H04211 Epiphora due to excess lacrimation, right lacrimal gland: Secondary | ICD-10-CM | POA: Diagnosis not present

## 2019-07-17 DIAGNOSIS — H1045 Other chronic allergic conjunctivitis: Secondary | ICD-10-CM | POA: Diagnosis not present

## 2019-08-07 ENCOUNTER — Other Ambulatory Visit: Payer: Self-pay

## 2019-08-07 ENCOUNTER — Ambulatory Visit (INDEPENDENT_AMBULATORY_CARE_PROVIDER_SITE_OTHER): Payer: Self-pay | Admitting: Internal Medicine

## 2019-08-07 ENCOUNTER — Telehealth: Payer: Self-pay | Admitting: Internal Medicine

## 2019-08-07 DIAGNOSIS — T2220XA Burn of second degree of shoulder and upper limb, except wrist and hand, unspecified site, initial encounter: Secondary | ICD-10-CM

## 2019-08-07 NOTE — Telephone Encounter (Signed)
Could you let Mr. Nohr know that he does not need to continue but I would like to see the final result after using the cream.  We had talked about potentially coming in for a picture of the wound site, no visit needed and he was agreeable at that time.  Could you ask him if he could come in this week any time before 3pm before Friday.  Or week of august 9th?  Thanks!

## 2019-08-07 NOTE — Telephone Encounter (Signed)
Jeremy [name] will call patient to have him come in at N/C to let Dr. Heber [place] take a photo and see if he needs more cream.

## 2019-08-07 NOTE — Progress Notes (Signed)
Patient presents today after 6 weeks of using [place].  He says that he has been using it twice daily.  He has no complaints today.  Picture was placed in the chart.  Some hypertrophic areas noted.  Wound continues to be closed and healing.  I think patient would benefit from 1 more course of skinuva.  Patient stated he would also like to continue using the cream.  Also recommended to continue using sunscreen when going outside.  We will have him follow-up for another picture in 6 weeks.

## 2019-08-07 NOTE — Telephone Encounter (Signed)
Patient lvm saying he finished the skinuva cream and wanted to know if Dr. Heber [place] wanted him to continue with that treatment. Please call to advise.

## 2019-08-28 DIAGNOSIS — R69 Illness, unspecified: Secondary | ICD-10-CM | POA: Diagnosis not present

## 2019-09-28 ENCOUNTER — Other Ambulatory Visit: Payer: Self-pay | Admitting: Allergy and Immunology

## 2019-09-28 DIAGNOSIS — Z8546 Personal history of malignant neoplasm of prostate: Secondary | ICD-10-CM | POA: Diagnosis not present

## 2019-10-02 DIAGNOSIS — N5235 Erectile dysfunction following radiation therapy: Secondary | ICD-10-CM | POA: Diagnosis not present

## 2019-10-02 DIAGNOSIS — Z8546 Personal history of malignant neoplasm of prostate: Secondary | ICD-10-CM | POA: Diagnosis not present

## 2019-10-09 DIAGNOSIS — L570 Actinic keratosis: Secondary | ICD-10-CM | POA: Diagnosis not present

## 2019-10-09 DIAGNOSIS — D692 Other nonthrombocytopenic purpura: Secondary | ICD-10-CM | POA: Diagnosis not present

## 2019-10-09 DIAGNOSIS — L579 Skin changes due to chronic exposure to nonionizing radiation, unspecified: Secondary | ICD-10-CM | POA: Diagnosis not present

## 2019-10-27 ENCOUNTER — Other Ambulatory Visit: Payer: Self-pay

## 2019-10-27 ENCOUNTER — Ambulatory Visit (INDEPENDENT_AMBULATORY_CARE_PROVIDER_SITE_OTHER): Payer: Medicare HMO | Admitting: *Deleted

## 2019-10-27 DIAGNOSIS — Z23 Encounter for immunization: Secondary | ICD-10-CM

## 2019-11-30 ENCOUNTER — Ambulatory Visit (INDEPENDENT_AMBULATORY_CARE_PROVIDER_SITE_OTHER): Payer: Medicare HMO | Admitting: Internal Medicine

## 2019-11-30 ENCOUNTER — Other Ambulatory Visit: Payer: Self-pay

## 2019-11-30 ENCOUNTER — Encounter: Payer: Self-pay | Admitting: Internal Medicine

## 2019-11-30 DIAGNOSIS — T22292S Burn of second degree of multiple sites of left shoulder and upper limb, except wrist and hand, sequela: Secondary | ICD-10-CM

## 2019-11-30 DIAGNOSIS — E785 Hyperlipidemia, unspecified: Secondary | ICD-10-CM

## 2019-11-30 DIAGNOSIS — L905 Scar conditions and fibrosis of skin: Secondary | ICD-10-CM

## 2019-11-30 DIAGNOSIS — Z8546 Personal history of malignant neoplasm of prostate: Secondary | ICD-10-CM

## 2019-11-30 LAB — URINALYSIS
Bilirubin Urine: NEGATIVE
Hgb urine dipstick: NEGATIVE
Ketones, ur: NEGATIVE
Leukocytes,Ua: NEGATIVE
Nitrite: NEGATIVE
Specific Gravity, Urine: 1.025 (ref 1.000–1.030)
Total Protein, Urine: NEGATIVE
Urine Glucose: NEGATIVE
Urobilinogen, UA: 1 (ref 0.0–1.0)
pH: 6.5 (ref 5.0–8.0)

## 2019-11-30 LAB — COMPREHENSIVE METABOLIC PANEL
ALT: 18 U/L (ref 0–53)
AST: 21 U/L (ref 0–37)
Albumin: 4 g/dL (ref 3.5–5.2)
Alkaline Phosphatase: 61 U/L (ref 39–117)
BUN: 16 mg/dL (ref 6–23)
CO2: 30 mEq/L (ref 19–32)
Calcium: 9 mg/dL (ref 8.4–10.5)
Chloride: 107 mEq/L (ref 96–112)
Creatinine, Ser: 0.83 mg/dL (ref 0.40–1.50)
GFR: 83.2 mL/min (ref >60.00)
Glucose, Bld: 97 mg/dL (ref 70–99)
Potassium: 4.2 mEq/L (ref 3.5–5.1)
Sodium: 142 mEq/L (ref 135–145)
Total Bilirubin: 0.9 mg/dL (ref 0.2–1.2)
Total Protein: 6.7 g/dL (ref 6.0–8.3)

## 2019-11-30 LAB — CBC WITH DIFFERENTIAL/PLATELET
Basophils Absolute: 0 10*3/uL (ref 0.0–0.1)
Basophils Relative: 0.5 % (ref 0.0–3.0)
Eosinophils Absolute: 0.1 10*3/uL (ref 0.0–0.7)
Eosinophils Relative: 1.1 % (ref 0.0–5.0)
HCT: 43.8 % (ref 39.0–52.0)
Hemoglobin: 14.9 g/dL (ref 13.0–17.0)
Lymphocytes Relative: 42.2 % (ref 12.0–46.0)
Lymphs Abs: 2 10*3/uL (ref 0.7–4.0)
MCHC: 34.1 g/dL (ref 30.0–36.0)
MCV: 100 fl (ref 78.0–100.0)
Monocytes Absolute: 0.5 10*3/uL (ref 0.1–1.0)
Monocytes Relative: 9.9 % (ref 3.0–12.0)
Neutro Abs: 2.1 10*3/uL (ref 1.4–7.7)
Neutrophils Relative %: 46.3 % (ref 43.0–77.0)
Platelets: 183 10*3/uL (ref 150.0–400.0)
RBC: 4.38 Mil/uL (ref 4.22–5.81)
RDW: 13.4 % (ref 11.5–15.5)
WBC: 4.6 10*3/uL (ref 4.0–10.5)

## 2019-11-30 LAB — LIPID PANEL
Cholesterol: 148 mg/dL (ref 0–200)
HDL: 41.1 mg/dL (ref >39.00)
LDL Cholesterol: 95 mg/dL (ref 0–99)
NonHDL: 106.66
Total CHOL/HDL Ratio: 4
Triglycerides: 60 mg/dL (ref 0.0–149.0)
VLDL: 12 mg/dL (ref 0.0–40.0)

## 2019-11-30 LAB — TSH: TSH: 1.35 u[IU]/mL (ref 0.35–4.50)

## 2019-11-30 LAB — PSA: PSA: 0.05 ng/mL — ABNORMAL LOW (ref 0.10–4.00)

## 2019-11-30 MED ORDER — VITAMIN D3 50 MCG (2000 UT) PO CAPS: 2000.0000 [IU] | ORAL_CAPSULE | Freq: Every day | ORAL | 3 refills | Status: AC

## 2019-11-30 NOTE — Assessment & Plan Note (Signed)
Labs

## 2019-11-30 NOTE — Progress Notes (Signed)
   Subjective:  Patient ID: Jeremy [name], male    DOB: 1940-10-03  Age: 79 y.o. MRN: [id number]  CC: Diabetes (6 MONTH F/U)   HPI DAMON HARGROVE presents for 6 months - allergies, h/o prostate cancer  Outpatient Medications Prior to Visit  Medication Sig Dispense Refill  . budesonide (RHINOCORT ALLERGY) 32 MCG/ACT nasal spray Place 1 spray into both nostrils as needed. Reported on 02/26/2015    . mometasone (NASONEX) 50 MCG/ACT nasal spray Use 2 sprays in each nostril every night 17 g 11  . Acetaminophen (APAP 500 PO) Take 1 tablet by mouth 2 (two) times daily. Reported on 02/26/2015 (Patient not taking: Reported on 11/30/2019)    . azelastine (ASTELIN) 0.1 % nasal spray USE TWO SPRAYS IN EACH NOSTRIL TWICE DAILY AS NEEDED FOR RUNNY NOSE AND SNEEZING. (Patient not taking: Reported on 11/30/2019) 30 mL 5  . collagenase (SANTYL) ointment Apply 1 application topically daily. (Patient not taking: Reported on 11/30/2019) 15 g 0  . loratadine-pseudoephedrine (CLARITIN-D 24-HOUR) 10-240 MG 24 hr tablet Take 1 tablet by mouth daily. (Patient not taking: Reported on 11/30/2019)    . montelukast (SINGULAIR) 10 MG tablet Take 1 tablet (10 mg total) by mouth at bedtime. (Patient not taking: Reported on 11/30/2019) 30 tablet 11   No facility-administered medications prior to visit.    ROS: Review of Systems  Objective:  BP 128/78 (BP Location: Left Arm)   Pulse 68   Temp 98.2 F (36.8 C) (Oral)   Wt 176 lb 6.4 oz (80 kg)   SpO2 97%   BMI 27.63 kg/m   BP Readings from Last 3 Encounters:  11/30/19 128/78  07/03/19 113/71  06/26/19 114/72    Wt Readings from Last 3 Encounters:  11/30/19 176 lb 6.4 oz (80 kg)  07/03/19 177 lb (80.3 kg)  06/08/19 177 lb (80.3 kg)    Physical Exam  Lab Results  Component Value Date   WBC 3.4 (L) 11/21/2018   HGB 14.3 11/21/2018   HCT 41.0 11/21/2018   PLT 172.0 11/21/2018   GLUCOSE 101 (H) 05/29/2019   CHOL 135 11/21/2018   TRIG 75.0  11/21/2018   HDL 32.00 (L) 11/21/2018   LDLCALC 88 11/21/2018   ALT 29 11/21/2018   AST 29 11/21/2018   NA 139 05/29/2019   K 3.8 05/29/2019   CL 107 05/29/2019   CREATININE 0.83 05/29/2019   BUN 19 05/29/2019   CO2 27 05/29/2019   TSH 2.43 11/21/2018   PSA 0.02 (L) 11/21/2018   INR 1.0 ratio 03/27/2008    No results found.  Assessment & Plan:   There are no diagnoses linked to this encounter.   No orders of the defined types were placed in this encounter.    Follow-up: No follow-ups on file.  Walker Kehr, MD

## 2019-11-30 NOTE — Assessment & Plan Note (Signed)
Healing - scars

## 2019-11-30 NOTE — Assessment & Plan Note (Signed)
PSA

## 2019-11-30 NOTE — Addendum Note (Signed)
Addended by: Jacob Moores on: 11/30/2019 10:13 AM   Modules accepted: Orders

## 2019-12-14 ENCOUNTER — Ambulatory Visit: Payer: Medicare HMO

## 2019-12-27 ENCOUNTER — Other Ambulatory Visit: Payer: Self-pay

## 2019-12-27 ENCOUNTER — Ambulatory Visit (INDEPENDENT_AMBULATORY_CARE_PROVIDER_SITE_OTHER): Payer: Medicare HMO

## 2019-12-27 VITALS — BP 122/70 | HR 71 | Temp 98.2°F | Ht 68.0 in | Wt 172.4 lb

## 2019-12-27 DIAGNOSIS — Z0000 Encounter for general adult medical examination without abnormal findings: Secondary | ICD-10-CM

## 2019-12-27 NOTE — Progress Notes (Addendum)
Subjective:   Jeremy [name] is a 79 y.o. male who presents for Medicare Annual/Subsequent preventive examination.  Review of Systems    No ROS. Medicare Wellness Visit. Additional risk factors are reflected in social history. Cardiac Risk Factors include: advanced age (>23men, >54 women);dyslipidemia;male gender     Objective:    Today's Vitals   12/27/19 1518  BP: 122/70  Pulse: 71  Temp: 98.2 F (36.8 C)  SpO2: 97%  Weight: 172 lb 6.4 oz (78.2 kg)  Height: 5\' 8"  (1.727 m)  PainSc: 0-No pain   Body mass index is 26.21 kg/m.  Advanced Directives 12/27/2019 04/21/2019 10/12/2016 11/07/2015  Does Patient Have a Medical Advance Directive? Yes Yes Yes Yes  Type of Advance Directive - - [place];Living will [place];Living will  Does patient want to make changes to medical advance directive? No - Patient declined - - No - Patient declined  Copy of [place] in Chart? - - No - copy requested No - copy requested    Current Medications (verified) Outpatient Encounter Medications as of 12/27/2019  Medication Sig   Cholecalciferol (VITAMIN D3) 50 MCG (2000 UT) capsule Take 1 capsule (2,000 Units total) by mouth daily.   mometasone (NASONEX) 50 MCG/ACT nasal spray Use 2 sprays in each nostril every night   No facility-administered encounter medications on file as of 12/27/2019.    Allergies (verified) Aspirin   History: Past Medical History:  Diagnosis Date   AK (actinic keratosis)    Allergic rhinitis    History of colonic polyps    Dr. Ardis Hughs- Due 2018   History of skin cancer    Osteopenia    Prostate cancer ([place])    hx of s/p XRT, seeds Dr. Risa Grill   Past Surgical History:  Procedure Laterality Date   INSERTION PROSTATE RADIATION SEED     KNEE ARTHROSCOPY Right    KNEE SURGERY Left    TONSILLECTOMY     XRT  2008   Family History  Problem Relation Age of Onset   Liver disease Father    Cancer  Father        pancreas ?   Cancer - Other Other        intestinal cancer   Social History   Socioeconomic History   Marital status: Married    Spouse name: Not on file   Number of children: 2   Years of education: Not on file   Highest education level: Not on file  Occupational History   Occupation: retired from [employer]  Tobacco Use   Smoking status: Current Some Day Smoker    Types: Cigars   Smokeless tobacco: Never Used   Tobacco comment: 2 cigars a month  Vaping Use   Vaping Use: Never used  Substance and Sexual Activity   Alcohol use: Yes    Alcohol/week: 2.0 standard drinks    Types: 2 Cans of beer per week   Drug use: No   Sexual activity: Yes  Other Topics Concern   Not on file  Social History Narrative   Not on file   Social Determinants of Health   Financial Resource Strain: Low Risk    Difficulty of Paying Living Expenses: Not hard at all  Food Insecurity: No Food Insecurity   Worried About Charity fundraiser in the Last Year: Never true   Ran Out of Food in the Last Year: Never true  Transportation Needs: No Transportation Needs  Lack of Transportation (Medical): No   Lack of Transportation (Non-Medical): No  Physical Activity: Sufficiently Active   Days of Exercise per Week: 5 days   Minutes of Exercise per Session: 30 min  Stress: No Stress Concern Present   Feeling of Stress : Not at all  Social Connections: Socially Integrated   Frequency of Communication with Friends and Family: More than three times a week   Frequency of Social Gatherings with Friends and Family: More than three times a week   Attends Religious Services: More than 4 times per year   Active Member of [employer] or Organizations: Yes   Attends Music therapist: More than 4 times per year   Marital Status: Married    Tobacco Counseling Ready to quit: Not Answered Counseling given: Not Answered Comment: 2 cigars a month   Clinical Intake:  Pre-visit preparation  completed: Yes  Pain : No/denies pain Pain Score: 0-No pain     BMI - recorded: 26.21 Nutritional Status: BMI 25 -29 Overweight Nutritional Risks: None Diabetes: No  How often do you need to have someone help you when you read instructions, pamphlets, or other written materials from your doctor or pharmacy?: 1 - Never What is the last grade level you completed in school?: 2 years of college  Diabetic? no  Interpreter Needed?: No  Information entered by :: [place] of Daily Living In your present state of health, do you have any difficulty performing the following activities: 12/27/2019  Hearing? N  Vision? N  Difficulty concentrating or making decisions? N  Walking or climbing stairs? N  Dressing or bathing? N  Doing errands, shopping? N  Preparing Food and eating ? N  Using the Toilet? N  In the past six months, have you accidently leaked urine? N  Do you have problems with loss of bowel control? N  Managing your Medications? N  Managing your Finances? N  Some recent data might be hidden    Patient Care Team: Plotnikov, Evie Lacks, MD as PCP - General Rana Snare, MD (Urology) Macarthur Critchley, [place] as Referring Physician (Optometry)  Indicate any recent Medical Services you may have received from other than [facility] providers in the past year (date may be approximate).     Assessment:   This is a routine wellness examination for Jeremy [name].  Hearing/Vision screen No exam data present  Dietary issues and exercise activities discussed: Current Exercise Habits: Home exercise routine, Type of exercise: Other - see comments;walking (goes to gym and golfing twice a week), Time (Minutes): 30, Frequency (Times/Week): 5, Weekly Exercise (Minutes/Week): 150, Intensity: Moderate, Exercise limited by: None identified  Goals       <enter goal here> (pt-stated)      Patient would like to maintain current health status.       DIET - INCREASE WATER INTAKE  (pt-stated)      My goal is to drink more water and improve my golf game.       Depression Screen PHQ 2/9 Scores 12/27/2019 05/29/2019 11/15/2017 11/12/2016 11/07/2015 10/22/2014  PHQ - 2 Score 0 0 0 0 0 0    Fall Risk Fall Risk  12/27/2019 05/29/2019 11/15/2017 11/12/2016 11/07/2015  Falls in the past year? 0 0 0 No No  Number falls in past yr: 0 - - - -  Injury with Fall? 0 - - - -  Risk for fall due to : No Fall Risks - - - -  Follow up Falls  evaluation completed Falls evaluation completed Falls evaluation completed - -    FALL RISK PREVENTION PERTAINING TO THE HOME:  Any stairs in or around the home? No  If so, are there any without handrails? No  Home free of loose throw rugs in walkways, pet beds, electrical cords, etc? Yes  Adequate lighting in your home to reduce risk of falls? Yes   ASSISTIVE DEVICES UTILIZED TO PREVENT FALLS:  Life alert? No  Use of a cane, walker or w/c? No  Grab bars in the bathroom? No  Shower chair or bench in shower? No  Elevated toilet seat or a handicapped toilet? No   TIMED UP AND GO:  Was the test performed? No .  Length of time to ambulate 10 feet: 0 sec.   Gait steady and fast without use of assistive device  Cognitive Function: Normal cognitive status assessed by direct observation by this Nurse Health Advisor. No abnormalities found.          Immunizations Immunization History  Administered Date(s) Administered   Fluad Quad(high Dose 65+) 10/05/2018, 10/27/2019   Influenza Split 10/08/2011   Influenza Whole 11/18/2006, 10/31/2007, 09/22/2010   Influenza, High Dose Seasonal PF 11/07/2015, 11/12/2016, 11/15/2017   Influenza,inj,Quad PF,6+ Mos 10/14/2012, 10/20/2013, 10/22/2014   PFIZER SARS-COV-2 Vaccination 02/06/2019, 02/27/2019, 11/11/2019   Pneumococcal Conjugate-13 04/22/2015   Pneumococcal Polysaccharide-23 08/22/2009   Td 09/13/2008   Tdap 09/28/2018   Zoster 10/18/2009   Zoster Recombinat (Shingrix) 03/11/2018,  07/18/2018    TDAP status: Up to date  Flu Vaccine status: Up to date  Pneumococcal vaccine status: Up to date  Covid-19 vaccine status: Completed vaccines  Qualifies for Shingles Vaccine? Yes   Zostavax completed Yes   Shingrix Completed?: Yes  Screening Tests Health Maintenance  Topic Date Due   Hepatitis C Screening  Never done   TETANUS/TDAP  09/27/2028   INFLUENZA VACCINE  Completed   COVID-19 Vaccine  Completed   PNA vac Low Risk Adult  Completed    Health Maintenance  Health Maintenance Due  Topic Date Due   Hepatitis C Screening  Never done    Colorectal cancer screening: No longer required.   Lung Cancer Screening: (Low Dose CT Chest recommended if Age 36-80 years, 30 pack-year currently smoking OR have quit w/in 15years.) does not qualify.   Lung Cancer Screening Referral: no  Additional Screening:  Hepatitis C Screening: does qualify; Completed no  Vision Screening: Recommended annual ophthalmology exams for early detection of glaucoma and other disorders of the eye. Is the patient up to date with their annual eye exam?  Yes  Who is the provider or what is the name of the office in which the patient attends annual eye exams? Macarthur Critchley, MD If pt is not established with a provider, would they like to be referred to a provider to establish care? No .   Dental Screening: Recommended annual dental exams for proper oral hygiene  Community Resource Referral / Chronic Care Management: CRR required this visit?  No   CCM required this visit?  No      Plan:     I have personally reviewed and noted the following in the patient's chart:   Medical and social history Use of alcohol, tobacco or illicit drugs  Current medications and supplements Functional ability and status Nutritional status Physical activity Advanced directives List of other physicians Hospitalizations, surgeries, and ER visits in previous 12 months Vitals Screenings to include  cognitive, depression, and falls Referrals and  appointments  In addition, I have reviewed and discussed with patient certain preventive protocols, quality metrics, and best practice recommendations. A written personalized care plan for preventive services as well as general preventive health recommendations were provided to patient.     Sheral Flow, LPN   01/60/1093   Nurse Notes: n/a  Medical screening examination/treatment/procedure(s) were performed by non-physician practitioner and as supervising physician I was immediately available for consultation/collaboration.  I agree with above. Lew Dawes, MD

## 2019-12-27 NOTE — Patient Instructions (Signed)
Jeremy [name] , Thank you for taking time to come for your Medicare Wellness Visit. I appreciate your ongoing commitment to your health goals. Please review the following plan we discussed and let me know if I can assist you in the future.   Screening recommendations/referrals: Colonoscopy: 10/12/2016; no repeat due to age Recommended yearly ophthalmology/optometry visit for glaucoma screening and checkup Recommended yearly dental visit for hygiene and checkup  Vaccinations: Influenza vaccine: 10/27/2019 Pneumococcal vaccine: up to date Tdap vaccine: 09/28/2018; due every 10 years Shingles vaccine: up to date   Covid-19: up to date  Advanced directives: Please bring a copy of your health care power of attorney and living will to the office at your convenience.  Conditions/risks identified: Yes; Reviewed health maintenance screenings with patient today and relevant education, vaccines, and/or referrals were provided. Please continue to do your personal lifestyle choices by: daily care of teeth and gums, regular physical activity (goal should be 5 days a week for 30 minutes), eat a healthy diet, avoid tobacco and drug use, limiting any alcohol intake, taking a low-dose aspirin (if not allergic or have been advised by your provider otherwise) and taking vitamins and minerals as recommended by your provider. Continue doing brain stimulating activities (puzzles, reading, adult coloring books, staying active) to keep memory sharp. Continue to eat heart healthy diet (full of fruits, vegetables, whole grains, lean protein, water--limit salt, fat, and sugar intake) and increase physical activity as tolerated.  Next appointment: Please schedule your next Medicare Wellness Visit with your Nurse Health Advisor in 1 year by calling [phone].  Preventive Care 79 Years and Older, Male Preventive care refers to lifestyle choices and visits with your health care provider that can promote health and  wellness. What does preventive care include?  A yearly physical exam. This is also called an annual well check.  Dental exams once or twice a year.  Routine eye exams. Ask your health care provider how often you should have your eyes checked.  Personal lifestyle choices, including:  Daily care of your teeth and gums.  Regular physical activity.  Eating a healthy diet.  Avoiding tobacco and drug use.  Limiting alcohol use.  Practicing safe sex.  Taking low doses of aspirin every day.  Taking vitamin and mineral supplements as recommended by your health care provider. What happens during an annual well check? The services and screenings done by your health care provider during your annual well check will depend on your age, overall health, lifestyle risk factors, and family history of disease. Counseling  Your health care provider may ask you questions about your:  Alcohol use.  Tobacco use.  Drug use.  Emotional well-being.  Home and relationship well-being.  Sexual activity.  Eating habits.  History of falls.  Memory and ability to understand (cognition).  Work and work Statistician. Screening  You may have the following tests or measurements:  Height, weight, and BMI.  Blood pressure.  Lipid and cholesterol levels. These may be checked every 5 years, or more frequently if you are over 81 years old.  Skin check.  Lung cancer screening. You may have this screening every year starting at age 50 if you have a 30-pack-year history of smoking and currently smoke or have quit within the past 15 years.  Fecal occult blood test (FOBT) of the stool. You may have this test every year starting at age 31.  Flexible sigmoidoscopy or colonoscopy. You may have a sigmoidoscopy every 5 years or a colonoscopy every  10 years starting at age 90.  Prostate cancer screening. Recommendations will vary depending on your family history and other risks.  Hepatitis C blood  test.  Hepatitis B blood test.  Sexually transmitted disease (STD) testing.  Diabetes screening. This is done by checking your blood sugar (glucose) after you have not eaten for a while (fasting). You may have this done every 1-3 years.  Abdominal aortic aneurysm (AAA) screening. You may need this if you are a current or former smoker.  Osteoporosis. You may be screened starting at age 11 if you are at high risk. Talk with your health care provider about your test results, treatment options, and if necessary, the need for more tests. Vaccines  Your health care provider may recommend certain vaccines, such as:  Influenza vaccine. This is recommended every year.  Tetanus, diphtheria, and acellular pertussis (Tdap, Td) vaccine. You may need a Td booster every 10 years.  Zoster vaccine. You may need this after age 48.  Pneumococcal 13-valent conjugate (PCV13) vaccine. One dose is recommended after age 42.  Pneumococcal polysaccharide (PPSV23) vaccine. One dose is recommended after age 29. Talk to your health care provider about which screenings and vaccines you need and how often you need them. This information is not intended to replace advice given to you by your health care provider. Make sure you discuss any questions you have with your health care provider. Document Released: 01/25/2015 Document Revised: 09/18/2015 Document Reviewed: 10/30/2014 Elsevier Interactive Patient Education  2017 [place] Prevention in the Home Falls can cause injuries. They can happen to people of all ages. There are many things you can do to make your home safe and to help prevent falls. What can I do on the outside of my home?  Regularly fix the edges of walkways and driveways and fix any cracks.  Remove anything that might make you trip as you walk through a door, such as a raised step or threshold.  Trim any bushes or trees on the path to your home.  Use bright outdoor  lighting.  Clear any walking paths of anything that might make someone trip, such as rocks or tools.  Regularly check to see if handrails are loose or broken. Make sure that both sides of any steps have handrails.  Any raised decks and porches should have guardrails on the edges.  Have any leaves, snow, or ice cleared regularly.  Use sand or salt on walking paths during winter.  Clean up any spills in your garage right away. This includes oil or grease spills. What can I do in the bathroom?  Use night lights.  Install grab bars by the toilet and in the tub and shower. Do not use towel bars as grab bars.  Use non-skid mats or decals in the tub or shower.  If you need to sit down in the shower, use a plastic, non-slip stool.  Keep the floor dry. Clean up any water that spills on the floor as soon as it happens.  Remove soap buildup in the tub or shower regularly.  Attach bath mats securely with double-sided non-slip rug tape.  Do not have throw rugs and other things on the floor that can make you trip. What can I do in the bedroom?  Use night lights.  Make sure that you have a light by your bed that is easy to reach.  Do not use any sheets or blankets that are too big for your bed. They should not  hang down onto the floor.  Have a firm chair that has side arms. You can use this for support while you get dressed.  Do not have throw rugs and other things on the floor that can make you trip. What can I do in the kitchen?  Clean up any spills right away.  Avoid walking on wet floors.  Keep items that you use a lot in easy-to-reach places.  If you need to reach something above you, use a strong step stool that has a grab bar.  Keep electrical cords out of the way.  Do not use floor polish or wax that makes floors slippery. If you must use wax, use non-skid floor wax.  Do not have throw rugs and other things on the floor that can make you trip. What can I do with my  stairs?  Do not leave any items on the stairs.  Make sure that there are handrails on both sides of the stairs and use them. Fix handrails that are broken or loose. Make sure that handrails are as long as the stairways.  Check any carpeting to make sure that it is firmly attached to the stairs. Fix any carpet that is loose or worn.  Avoid having throw rugs at the top or bottom of the stairs. If you do have throw rugs, attach them to the floor with carpet tape.  Make sure that you have a light switch at the top of the stairs and the bottom of the stairs. If you do not have them, ask someone to add them for you. What else can I do to help prevent falls?  Wear shoes that:  Do not have high heels.  Have rubber bottoms.  Are comfortable and fit you well.  Are closed at the toe. Do not wear sandals.  If you use a stepladder:  Make sure that it is fully opened. Do not climb a closed stepladder.  Make sure that both sides of the stepladder are locked into place.  Ask someone to hold it for you, if possible.  Clearly mark and make sure that you can see:  Any grab bars or handrails.  First and last steps.  Where the edge of each step is.  Use tools that help you move around (mobility aids) if they are needed. These include:  Canes.  Walkers.  Scooters.  Crutches.  Turn on the lights when you go into a dark area. Replace any light bulbs as soon as they burn out.  Set up your furniture so you have a clear path. Avoid moving your furniture around.  If any of your floors are uneven, fix them.  If there are any pets around you, be aware of where they are.  Review your medicines with your doctor. Some medicines can make you feel dizzy. This can increase your chance of falling. Ask your doctor what other things that you can do to help prevent falls. This information is not intended to replace advice given to you by your health care provider. Make sure you discuss any  questions you have with your health care provider. Document Released: 10/25/2008 Document Revised: 06/06/2015 Document Reviewed: 02/02/2014 Elsevier Interactive Patient Education  2017 [employer].

## 2019-12-29 DIAGNOSIS — H524 Presbyopia: Secondary | ICD-10-CM | POA: Diagnosis not present

## 2020-01-02 IMAGING — DX DG HIP (WITH OR WITHOUT PELVIS) 2-3V*L*
2 series · 2 of 2 positions shown · non-contrast
Comparison: AP pelvis of September 22, 2010

CLINICAL DATA: Three weeks of left hip pain. Clinical diagnosis of
bursitis today.

EXAM:
DG HIP (WITH OR WITHOUT PELVIS) 2-3V LEFT

[hip ap]
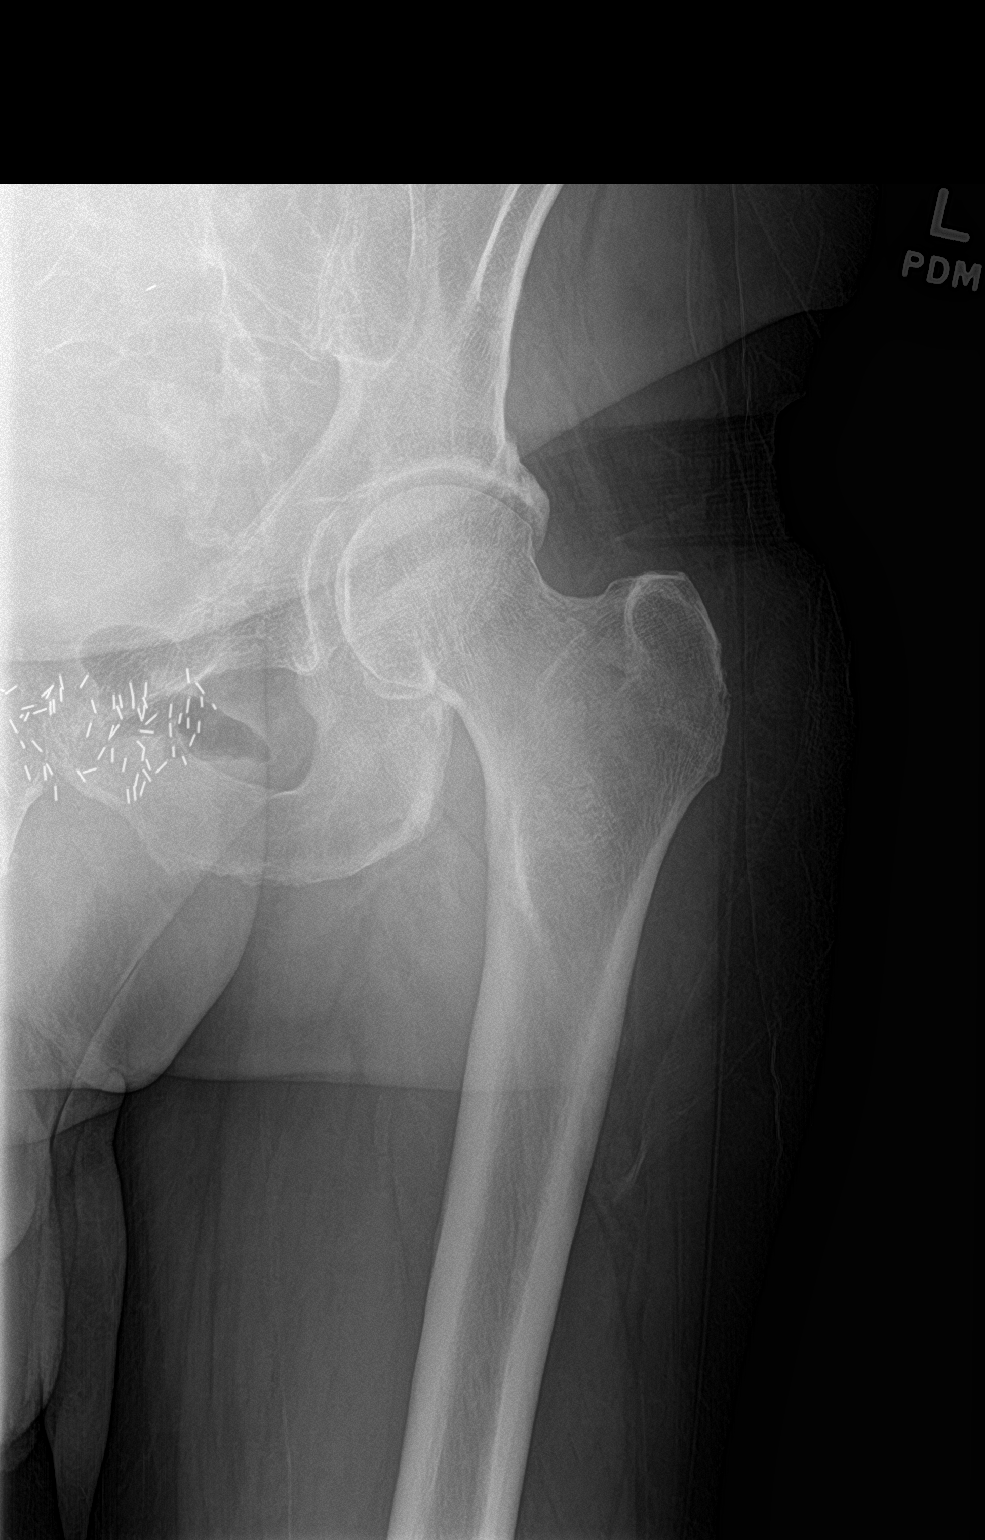

[hip lat]
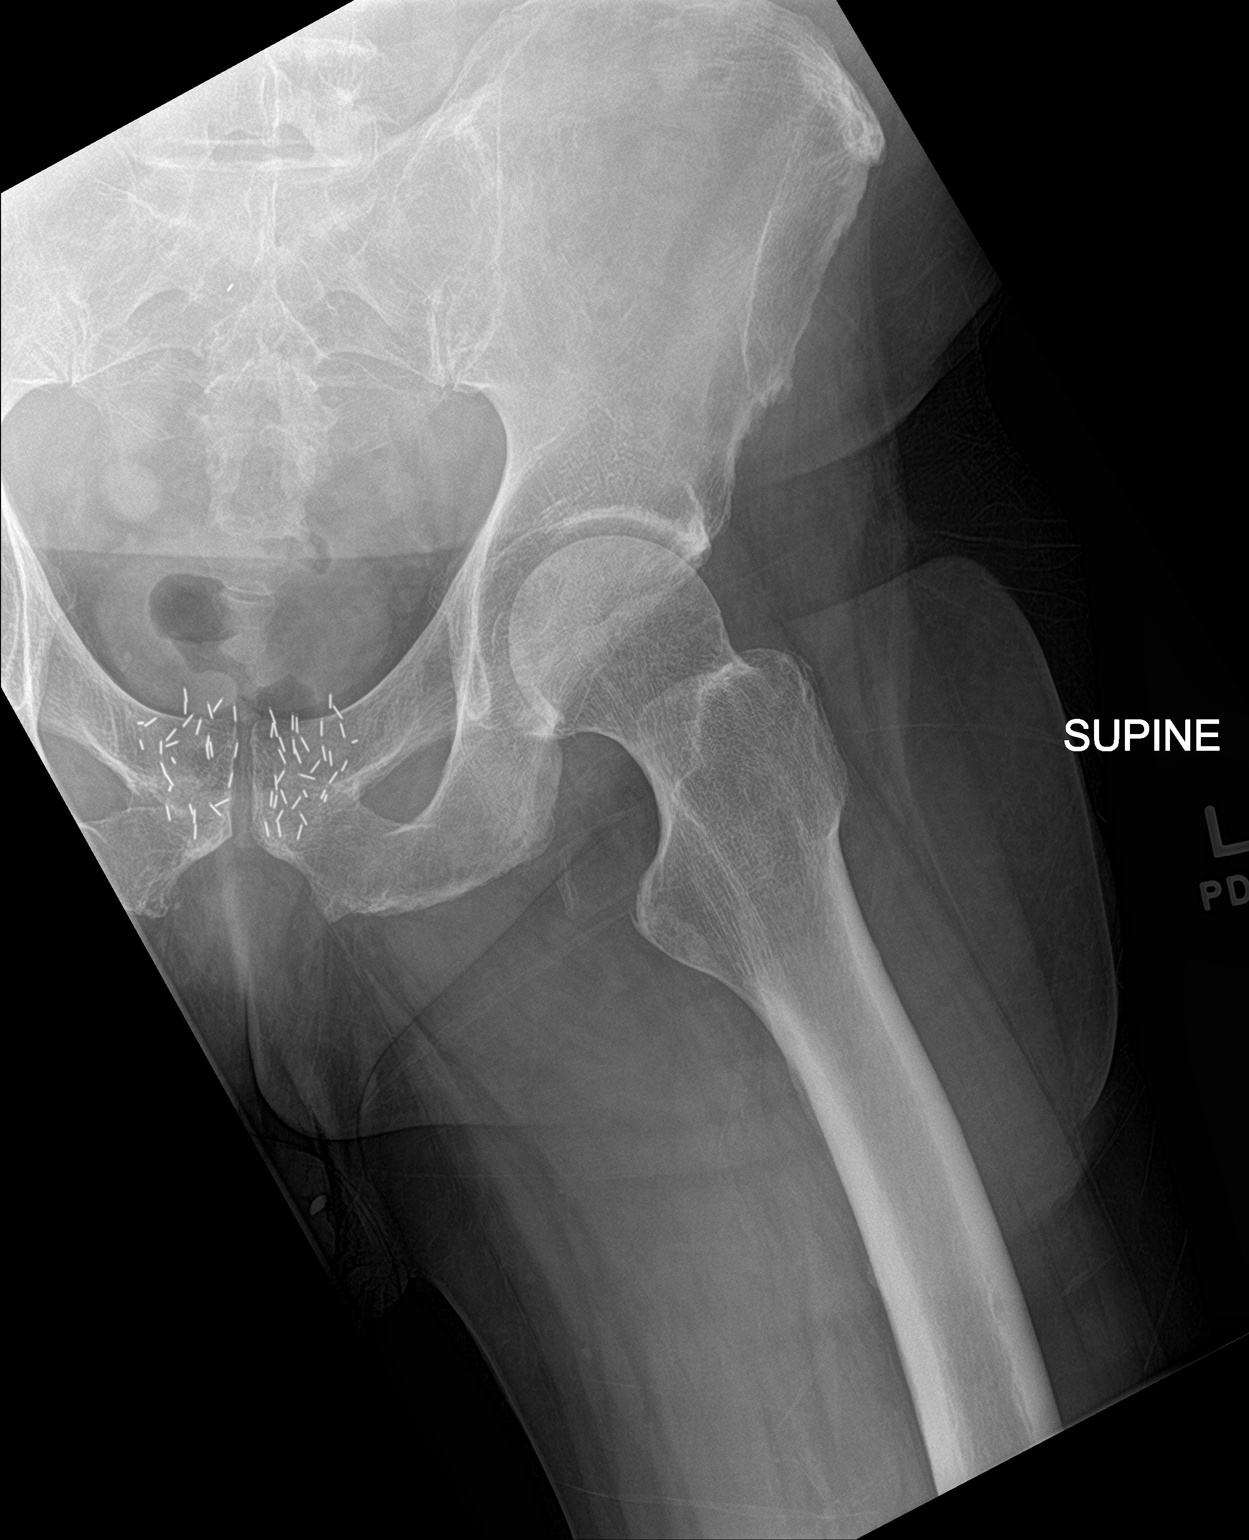

[2 of 2 positions shown; findings below may reference images not displayed]

FINDINGS: The bones are subjectively adequately mineralized. The joint space
is well maintained. The articular surfaces of the left femoral head
and acetabulum remains smoothly rounded. The femoral neck,
intertrochanteric, and subtrochanteric regions are normal.
IMPRESSION: There is no acute or significant chronic bony abnormality of the
left hip.

## 2020-01-11 DIAGNOSIS — L819 Disorder of pigmentation, unspecified: Secondary | ICD-10-CM | POA: Diagnosis not present

## 2020-01-11 DIAGNOSIS — L814 Other melanin hyperpigmentation: Secondary | ICD-10-CM | POA: Diagnosis not present

## 2020-01-11 DIAGNOSIS — L570 Actinic keratosis: Secondary | ICD-10-CM | POA: Diagnosis not present

## 2020-01-11 DIAGNOSIS — L821 Other seborrheic keratosis: Secondary | ICD-10-CM | POA: Diagnosis not present

## 2020-02-12 DIAGNOSIS — Z20822 Contact with and (suspected) exposure to covid-19: Secondary | ICD-10-CM | POA: Diagnosis not present

## 2020-04-02 NOTE — Progress Notes (Signed)
Jeremy [name] Phone: [phone] Subjective:   Jeremy [name], am serving as a scribe for Dr. Hulan [name]. This visit occurred during the SARS-CoV-2 public health emergency.  Safety protocols were in place, including screening questions prior to the visit, additional usage of staff PPE, and extensive cleaning of exam room while observing appropriate contact time as indicated for disinfecting solutions.   I'm seeing this patient by the request  of:  Plotnikov, Evie Lacks, MD  CC: Left knee pain  XNT:[id number]   11/29/2018 Left knee injected again.  Did not respond as well to the viscosupplementation.  Patient has responded better to the injections.  Hopefully this will be more beneficial.  Discussed wearing the brace on a regular basis.  Patient will follow up with me again 4-8 weeks  Update 04/03/2020 Jeremy [name] is a 80 y.o. male coming in with complaint of left knee pain. Patient states pain is mostly over medial aspect but it moves throughout the joint. Activity increases his pain. Has been golfing and going to gym.  R knee bothering him as well over medial aspect.   Xray L knee 2015 IMPRESSION:  There are mild degenerative changes involving the medial and lateral  joint compartments. [name] acute bony abnormality is demonstrated.       Past Medical History:  Diagnosis Date  . AK (actinic keratosis)   . Allergic rhinitis   . History of colonic polyps    Dr. Ardis Hughs- Due 2018  . History of skin cancer   . Osteopenia   . Prostate cancer ([place])    hx of s/p XRT, seeds Dr. Risa Grill   Past Surgical History:  Procedure Laterality Date  . INSERTION PROSTATE RADIATION SEED    . KNEE ARTHROSCOPY Right   . KNEE SURGERY Left   . TONSILLECTOMY    . XRT  2008   Social History   Socioeconomic History  . Marital status: Married    Spouse name: Not on file  . Number of children: 2  . Years of education: Not  on file  . Highest education level: Not on file  Occupational History  . Occupation: retired from [employer]  Tobacco Use  . Smoking status: Current Some Day Smoker    Types: Cigars  . Smokeless tobacco: Never Used  . Tobacco comment: 2 cigars a month  Vaping Use  . Vaping Use: Never used  Substance and Sexual Activity  . Alcohol use: Yes    Alcohol/week: 2.0 standard drinks    Types: 2 Cans of beer per week  . Drug use: [name]  . Sexual activity: Yes  Other Topics Concern  . Not on file  Social History Narrative  . Not on file   Social Determinants of Health   Financial Resource Strain: Low Risk   . Difficulty of Paying Living Expenses: Not hard at all  Food Insecurity: [name] Food Insecurity  . Worried About Charity fundraiser in the Last Year: Never true  . Ran Out of Food in the Last Year: Never true  Transportation Needs: [name] Transportation Needs  . Lack of Transportation (Medical): [name]  . Lack of Transportation (Non-Medical): [name]  Physical Activity: Sufficiently Active  . Days of Exercise per Week: 5 days  . Minutes of Exercise per Session: 30 min  Stress: [name] Stress Concern Present  . Feeling of Stress : Not at all  Social Connections: Socially Integrated  . Frequency of Communication  with Friends and Family: More than three times a week  . Frequency of Social Gatherings with Friends and Family: More than three times a week  . Attends Religious Services: More than 4 times per year  . Active Member of Clubs or Organizations: Yes  . Attends Archivist Meetings: More than 4 times per year  . Marital Status: Married   Allergies  Allergen Reactions  . Aspirin     REACTION: gi   Family History  Problem Relation Age of Onset  . Liver disease Father   . Cancer Father        pancreas ?  Marland Kitchen Cancer - Other Other        intestinal cancer      Current Outpatient Medications (Respiratory):  .  mometasone (NASONEX) 50 MCG/ACT nasal spray, Use 2 sprays in each nostril  every night    Current Outpatient Medications (Other):  Marland Kitchen  Cholecalciferol (VITAMIN D3) 50 MCG (2000 UT) capsule, Take 1 capsule (2,000 Units total) by mouth daily.   Reviewed prior external information including notes and imaging from  primary care provider As well as notes that were available from care everywhere and other healthcare systems.  Past medical history, social, surgical and family history all reviewed in electronic medical record.  [name] pertanent information unless stated regarding to the chief complaint.   Review of Systems:  [name] headache, visual changes, nausea, vomiting, diarrhea, constipation, dizziness, abdominal pain, skin rash, fevers, chills, night sweats, weight loss, swollen lymph nodes, body aches, joint swelling, chest pain, shortness of breath, mood changes. POSITIVE muscle aches  Objective  Blood pressure 134/74, pulse 83, height 5\' 8"  (1.727 m), weight 179 lb (81.2 kg), SpO2 99 %.   General: [name] apparent distress alert and oriented x3 mood and affect normal, dressed appropriately.  HEENT: Pupils equal, extraocular movements intact  Respiratory: Patient's speak in full sentences and does not appear short of breath  Cardiovascular: [name] lower extremity edema, non tender, [name] erythema  Gait normal with good balance and coordination.  MSK: Left knee exam does have lacking the last 5 degrees of flexion.  Patient is mildly tender to palpation over the medial joint line.  Very trace amount of fluid.  Relatively [name] good stability noted of the knee.  Contralateral knee mild arthritic changes and crepitus but [name] significant pain  After informed written and verbal consent, patient was seated on exam table. Left knee was prepped with alcohol swab and utilizing anterolateral approach, patient's left knee space was injected with 4:1  marcaine 0.5%: Kenalog 40mg /dL. Patient tolerated the procedure well without immediate complications.    Impression and Recommendations:     The  above documentation has been reviewed and is accurate and complete Jeremy Pulley, DO

## 2020-04-03 ENCOUNTER — Other Ambulatory Visit: Payer: Self-pay

## 2020-04-03 ENCOUNTER — Encounter: Payer: Self-pay | Admitting: Family Medicine

## 2020-04-03 ENCOUNTER — Ambulatory Visit (INDEPENDENT_AMBULATORY_CARE_PROVIDER_SITE_OTHER): Payer: Medicare HMO | Admitting: Family Medicine

## 2020-04-03 DIAGNOSIS — M1712 Unilateral primary osteoarthritis, left knee: Secondary | ICD-10-CM | POA: Diagnosis not present

## 2020-04-03 NOTE — Patient Instructions (Signed)
Have a good golf trip See me when you need me!

## 2020-04-03 NOTE — Assessment & Plan Note (Signed)
Chronic problem with exacerbation.  Repeat injection given today.  Last injection was November 2020.  Hopefully patient will continue to respond.  Discussed icing regimen.  Continue bracing, icing, topical anti-inflammatories.  Follow-up with me again in 2 months.

## 2020-04-11 DIAGNOSIS — D225 Melanocytic nevi of trunk: Secondary | ICD-10-CM | POA: Diagnosis not present

## 2020-04-11 DIAGNOSIS — D485 Neoplasm of uncertain behavior of skin: Secondary | ICD-10-CM | POA: Diagnosis not present

## 2020-04-11 DIAGNOSIS — L570 Actinic keratosis: Secondary | ICD-10-CM | POA: Diagnosis not present

## 2020-04-11 DIAGNOSIS — D2262 Melanocytic nevi of left upper limb, including shoulder: Secondary | ICD-10-CM | POA: Diagnosis not present

## 2020-04-11 DIAGNOSIS — L814 Other melanin hyperpigmentation: Secondary | ICD-10-CM | POA: Diagnosis not present

## 2020-04-11 DIAGNOSIS — Z85828 Personal history of other malignant neoplasm of skin: Secondary | ICD-10-CM | POA: Diagnosis not present

## 2020-04-11 DIAGNOSIS — L905 Scar conditions and fibrosis of skin: Secondary | ICD-10-CM | POA: Diagnosis not present

## 2020-04-11 DIAGNOSIS — L821 Other seborrheic keratosis: Secondary | ICD-10-CM | POA: Diagnosis not present

## 2020-05-30 ENCOUNTER — Other Ambulatory Visit: Payer: Self-pay

## 2020-05-30 ENCOUNTER — Ambulatory Visit (INDEPENDENT_AMBULATORY_CARE_PROVIDER_SITE_OTHER): Payer: Medicare HMO | Admitting: Internal Medicine

## 2020-05-30 ENCOUNTER — Encounter: Payer: Self-pay | Admitting: Internal Medicine

## 2020-05-30 VITALS — BP 130/72 | HR 62 | Temp 98.1°F | Ht 68.0 in | Wt 173.6 lb

## 2020-05-30 DIAGNOSIS — E785 Hyperlipidemia, unspecified: Secondary | ICD-10-CM | POA: Diagnosis not present

## 2020-05-30 DIAGNOSIS — R030 Elevated blood-pressure reading, without diagnosis of hypertension: Secondary | ICD-10-CM

## 2020-05-30 DIAGNOSIS — Z8546 Personal history of malignant neoplasm of prostate: Secondary | ICD-10-CM | POA: Diagnosis not present

## 2020-05-30 DIAGNOSIS — J301 Allergic rhinitis due to pollen: Secondary | ICD-10-CM

## 2020-05-30 DIAGNOSIS — Z23 Encounter for immunization: Secondary | ICD-10-CM

## 2020-05-30 DIAGNOSIS — Z0000 Encounter for general adult medical examination without abnormal findings: Secondary | ICD-10-CM

## 2020-05-30 LAB — CBC WITH DIFFERENTIAL/PLATELET
Basophils Absolute: 0 10*3/uL (ref 0.0–0.1)
Basophils Relative: 0.4 % (ref 0.0–3.0)
Eosinophils Absolute: 0.2 10*3/uL (ref 0.0–0.7)
Eosinophils Relative: 3.6 % (ref 0.0–5.0)
HCT: 43.3 % (ref 39.0–52.0)
Hemoglobin: 15.1 g/dL (ref 13.0–17.0)
Lymphocytes Relative: 41.1 % (ref 12.0–46.0)
Lymphs Abs: 2 10*3/uL (ref 0.7–4.0)
MCHC: 34.8 g/dL (ref 30.0–36.0)
MCV: 100.4 fl — ABNORMAL HIGH (ref 78.0–100.0)
Monocytes Absolute: 0.5 10*3/uL (ref 0.1–1.0)
Monocytes Relative: 10.3 % (ref 3.0–12.0)
Neutro Abs: 2.2 10*3/uL (ref 1.4–7.7)
Neutrophils Relative %: 44.6 % (ref 43.0–77.0)
Platelets: 186 10*3/uL (ref 150.0–400.0)
RBC: 4.31 Mil/uL (ref 4.22–5.81)
RDW: 13.4 % (ref 11.5–15.5)
WBC: 4.9 10*3/uL (ref 4.0–10.5)

## 2020-05-30 LAB — LIPID PANEL
Cholesterol: 157 mg/dL (ref 0–200)
HDL: 43.5 mg/dL (ref >39.00)
LDL Cholesterol: 102 mg/dL — ABNORMAL HIGH (ref 0–99)
NonHDL: 113.98
Total CHOL/HDL Ratio: 4
Triglycerides: 60 mg/dL (ref 0.0–149.0)
VLDL: 12 mg/dL (ref 0.0–40.0)

## 2020-05-30 LAB — URINALYSIS
Bilirubin Urine: NEGATIVE
Hgb urine dipstick: NEGATIVE
Ketones, ur: NEGATIVE
Leukocytes,Ua: NEGATIVE
Nitrite: NEGATIVE
Specific Gravity, Urine: 1.015 (ref 1.000–1.030)
Total Protein, Urine: NEGATIVE
Urine Glucose: NEGATIVE
Urobilinogen, UA: 1 (ref 0.0–1.0)
pH: 8 (ref 5.0–8.0)

## 2020-05-30 LAB — COMPREHENSIVE METABOLIC PANEL
ALT: 19 U/L (ref 0–53)
AST: 20 U/L (ref 0–37)
Albumin: 4.1 g/dL (ref 3.5–5.2)
Alkaline Phosphatase: 59 U/L (ref 39–117)
BUN: 15 mg/dL (ref 6–23)
CO2: 28 mEq/L (ref 19–32)
Calcium: 8.9 mg/dL (ref 8.4–10.5)
Chloride: 104 mEq/L (ref 96–112)
Creatinine, Ser: 0.82 mg/dL (ref 0.40–1.50)
GFR: 83.22 mL/min (ref >60.00)
Glucose, Bld: 92 mg/dL (ref 70–99)
Potassium: 4.5 mEq/L (ref 3.5–5.1)
Sodium: 138 mEq/L (ref 135–145)
Total Bilirubin: 1 mg/dL (ref 0.2–1.2)
Total Protein: 6.5 g/dL (ref 6.0–8.3)

## 2020-05-30 LAB — PSA: PSA: 0.07 ng/mL — ABNORMAL LOW (ref 0.10–4.00)

## 2020-05-30 LAB — TSH: TSH: 1.33 u[IU]/mL (ref 0.35–4.50)

## 2020-05-30 NOTE — Progress Notes (Signed)
Subjective:  Patient ID: Jeremy [name], male    DOB: June 27, 1940  Age: 80 y.o. MRN: [id number]  CC: Follow-up (6 MONTH F/U)   HPI Jeremy [name] presents for OA, allergies, prostate ca  Outpatient Medications Prior to Visit  Medication Sig Dispense Refill  . Cholecalciferol (VITAMIN D3) 50 MCG (2000 UT) capsule Take 1 capsule (2,000 Units total) by mouth daily. 100 capsule 3  . mometasone (NASONEX) 50 MCG/ACT nasal spray Use 2 sprays in each nostril every night 17 g 11   No facility-administered medications prior to visit.    ROS: Review of Systems  Constitutional: Negative for appetite change, fatigue and unexpected weight change.  HENT: Negative for congestion, nosebleeds, sneezing, sore throat and trouble swallowing.   Eyes: Negative for itching and visual disturbance.  Respiratory: Negative for cough.   Cardiovascular: Negative for chest pain, palpitations and leg swelling.  Gastrointestinal: Negative for abdominal distention, blood in stool, diarrhea and nausea.  Genitourinary: Negative for frequency and hematuria.  Musculoskeletal: Positive for arthralgias. Negative for back pain, gait problem, joint swelling and neck pain.  Skin: Negative for rash.  Neurological: Negative for dizziness, tremors, speech difficulty and weakness.  Psychiatric/Behavioral: Negative for agitation, dysphoric mood and sleep disturbance. The patient is not nervous/anxious.     Objective:  BP 130/72 (BP Location: Left Arm)   Pulse 62   Temp 98.1 F (36.7 C) (Oral)   Ht 5\' 8"  (1.727 m)   Wt 173 lb 9.6 oz (78.7 kg)   SpO2 98%   BMI 26.40 kg/m   BP Readings from Last 3 Encounters:  05/30/20 130/72  04/03/20 134/74  12/27/19 122/70    Wt Readings from Last 3 Encounters:  05/30/20 173 lb 9.6 oz (78.7 kg)  04/03/20 179 lb (81.2 kg)  12/27/19 172 lb 6.4 oz (78.2 kg)    Physical Exam Constitutional:      General: He is not in acute distress.    Appearance: He is  well-developed.     Comments: NAD  Eyes:     Conjunctiva/sclera: Conjunctivae normal.     Pupils: Pupils are equal, round, and reactive to light.  Neck:     Thyroid: No thyromegaly.     Vascular: No JVD.  Cardiovascular:     Rate and Rhythm: Normal rate and regular rhythm.     Heart sounds: Normal heart sounds. No murmur heard. No friction rub. No gallop.   Pulmonary:     Effort: Pulmonary effort is normal. No respiratory distress.     Breath sounds: Normal breath sounds. No wheezing or rales.  Chest:     Chest wall: No tenderness.  Abdominal:     General: Bowel sounds are normal. There is no distension.     Palpations: Abdomen is soft. There is no mass.     Tenderness: There is no abdominal tenderness. There is no guarding or rebound.  Musculoskeletal:        General: No tenderness. Normal range of motion.     Cervical back: Normal range of motion.  Lymphadenopathy:     Cervical: No cervical adenopathy.  Skin:    General: Skin is warm and dry.     Findings: No rash.  Neurological:     Mental Status: He is alert and oriented to person, place, and time.     Cranial Nerves: No cranial nerve deficit.     Motor: No abnormal muscle tone.     Coordination: Coordination normal.     Gait:  Gait normal.     Deep Tendon Reflexes: Reflexes are normal and symmetric.  Psychiatric:        Behavior: Behavior normal.        Thought Content: Thought content normal.        Judgment: Judgment normal.     Lab Results  Component Value Date   WBC 4.6 11/30/2019   HGB 14.9 11/30/2019   HCT 43.8 11/30/2019   PLT 183.0 11/30/2019   GLUCOSE 97 11/30/2019   CHOL 148 11/30/2019   TRIG 60.0 11/30/2019   HDL 41.10 11/30/2019   LDLCALC 95 11/30/2019   ALT 18 11/30/2019   AST 21 11/30/2019   NA 142 11/30/2019   K 4.2 11/30/2019   CL 107 11/30/2019   CREATININE 0.83 11/30/2019   BUN 16 11/30/2019   CO2 30 11/30/2019   TSH 1.35 11/30/2019   PSA 0.05 (L) 11/30/2019   INR 1.0 ratio  03/27/2008    No results found.  Assessment & Plan:    Follow-up: No follow-ups on file.  Walker Kehr, MD

## 2020-05-30 NOTE — Assessment & Plan Note (Signed)
NAS diet Monitor BP

## 2020-05-30 NOTE — Addendum Note (Signed)
Addended by: Earnstine Regal on: 05/30/2020 10:43 AM   Modules accepted: Orders

## 2020-05-30 NOTE — Addendum Note (Signed)
Addended by: Jacobo Forest on: 05/30/2020 10:05 AM   Modules accepted: Orders

## 2020-05-30 NOTE — Assessment & Plan Note (Signed)
  On diet  

## 2020-05-30 NOTE — Assessment & Plan Note (Addendum)
PSA checks w/Urology

## 2020-05-30 NOTE — Assessment & Plan Note (Addendum)
Claritin prn Nasonex qd

## 2020-07-12 ENCOUNTER — Other Ambulatory Visit: Payer: Self-pay | Admitting: Allergy and Immunology

## 2020-08-13 ENCOUNTER — Ambulatory Visit (INDEPENDENT_AMBULATORY_CARE_PROVIDER_SITE_OTHER): Payer: Medicare HMO | Admitting: Internal Medicine

## 2020-08-13 ENCOUNTER — Other Ambulatory Visit: Payer: Self-pay

## 2020-08-13 ENCOUNTER — Encounter: Payer: Self-pay | Admitting: Internal Medicine

## 2020-08-13 VITALS — BP 140/80 | HR 69 | Temp 98.0°F | Ht 68.0 in | Wt 173.0 lb

## 2020-08-13 DIAGNOSIS — J301 Allergic rhinitis due to pollen: Secondary | ICD-10-CM

## 2020-08-13 DIAGNOSIS — R21 Rash and other nonspecific skin eruption: Secondary | ICD-10-CM | POA: Diagnosis not present

## 2020-08-13 DIAGNOSIS — R030 Elevated blood-pressure reading, without diagnosis of hypertension: Secondary | ICD-10-CM

## 2020-08-13 MED ORDER — MONTELUKAST SODIUM 10 MG PO TABS: 10.0000 mg | ORAL_TABLET | Freq: Every day | ORAL | 3 refills | Status: DC

## 2020-08-13 NOTE — Assessment & Plan Note (Signed)
Pt with allergic type marked pruritis without worsening rash, swelling to the legs each times x 3 with playing golf recently for several days each time; ok for singulari 10 qd, and zyrtec or allegra prn golf, declines allergy referral or serology testing such as [place] allergy panel

## 2020-08-13 NOTE — Assessment & Plan Note (Signed)
Chronic mild, also for singulair and zyrtec prn,   to f/u any worsening symptoms or concerns

## 2020-08-13 NOTE — Assessment & Plan Note (Signed)
Borderline elevated today likely situational, ok to continue to monitor BP at home and net visit

## 2020-08-13 NOTE — Progress Notes (Signed)
Patient ID: Jeremy [name], male   DOB: Jun 05, 1940, 80 y.o.   MRN: [id number]        Chief Complaint: follow up recurrent marked itching to the legs every time he plays golf       HPI:  Jeremy [name] is a 80 y.o. male here with 2 wk c/o playing golf 3 times and frustrated each time that afterward he has marked itching without swelling or rahs to the legs, only some better with topical benadryl;  has long hx of allergies, has seen allergist in past and does not want allergy shots, just not sure what to do now as hse doesn't want to give up golf; Pt denies chest pain, increased sob or doe, wheezing, orthopnea, PND, increased LE swelling, palpitations, dizziness or syncope.   Pt denies polydipsia, polyuria, or new focal neuro s/s.   Pt denies fever, wt loss, night sweats, loss of appetite, or other constitutional symptoms         Wt Readings from Last 3 Encounters:  08/13/20 173 lb (78.5 kg)  05/30/20 173 lb 9.6 oz (78.7 kg)  04/03/20 179 lb (81.2 kg)   BP Readings from Last 3 Encounters:  08/13/20 140/80  05/30/20 130/72  04/03/20 134/74         Past Medical History:  Diagnosis Date   AK (actinic keratosis)    Allergic rhinitis    History of colonic polyps    Dr. Ardis Hughs- Due 2018   History of skin cancer    Osteopenia    Prostate cancer ([place])    hx of s/p XRT, seeds Dr. Risa Grill   Past Surgical History:  Procedure Laterality Date   [place] ARTHROSCOPY Right    KNEE SURGERY Left    TONSILLECTOMY     XRT  2008    reports that he has been smoking cigars. He has never used smokeless tobacco. He reports current alcohol use of about 2.0 standard drinks of alcohol per week. He reports that he does not use drugs. family history includes Cancer in his father; Cancer - Other in an other family member; Liver disease in his father. Allergies  Allergen Reactions   Aspirin     REACTION: gi   Current Outpatient Medications on File Prior to Visit   Medication Sig Dispense Refill   Cholecalciferol (VITAMIN D3) 50 MCG (2000 UT) capsule Take 1 capsule (2,000 Units total) by mouth daily. 100 capsule 3   mometasone (NASONEX) 50 MCG/ACT nasal spray Use 2 sprays in each nostril every night 17 g 11   No current facility-administered medications on file prior to visit.        ROS:  All others reviewed and negative.  Objective        PE:  BP 140/80 (BP Location: Left Arm, Patient Position: Sitting, Cuff Size: Normal)   Pulse 69   Temp 98 F (36.7 C) (Oral)   Ht '5\' 8"'$  (1.727 m)   Wt 173 lb (78.5 kg)   SpO2 98%   BMI 26.30 kg/m                 Constitutional: Pt appears in NAD               HENT: Head: NCAT.                Right Ear: External ear normal.  Left Ear: External ear normal.                Eyes: . Pupils are equal, round, and reactive to light. Conjunctivae and EOM are normal               Nose: without d/c or deformity               Neck: Neck supple. Gross normal ROM               Cardiovascular: Normal rate and regular rhythm.                 Pulmonary/Chest: Effort normal and breath sounds without rales or wheezing.                Abd:  Soft, NT, ND, + BS, no organomegaly               Neurological: Pt is alert. At baseline orientation, motor grossly intact               Skin: Skin is warm. No rashes, no other new lesions, LE edema - none               Psychiatric: Pt behavior is normal without agitation   Micro: none  Cardiac tracings I have personally interpreted today:  none  Pertinent Radiological findings (summarize): none   Lab Results  Component Value Date   WBC 4.9 05/30/2020   HGB 15.1 05/30/2020   HCT 43.3 05/30/2020   PLT 186.0 05/30/2020   GLUCOSE 92 05/30/2020   CHOL 157 05/30/2020   TRIG 60.0 05/30/2020   HDL 43.50 05/30/2020   LDLCALC 102 (H) 05/30/2020   ALT 19 05/30/2020   AST 20 05/30/2020   NA 138 05/30/2020   K 4.5 05/30/2020   CL 104 05/30/2020   CREATININE 0.82  05/30/2020   BUN 15 05/30/2020   CO2 28 05/30/2020   TSH 1.33 05/30/2020   PSA 0.07 (L) 05/30/2020   INR 1.0 ratio 03/27/2008   Assessment/Plan:  Jeremy [name] is a 80 y.o. White or Caucasian [1] male with  has a past medical history of AK (actinic keratosis), Allergic rhinitis, History of colonic polyps, History of skin cancer, Osteopenia, and Prostate cancer ([place]).  Rash and nonspecific skin eruption Pt with allergic type marked pruritis without worsening rash, swelling to the legs each times x 3 with playing golf recently for several days each time; ok for singulari 10 qd, and zyrtec or allegra prn golf, declines allergy referral or serology testing such as [place] allergy panel  Elevated BP without diagnosis of hypertension Borderline elevated today likely situational, ok to continue to monitor BP at home and net visit  Allergic rhinitis Chronic mild, also for singulair and zyrtec prn,   to f/u any worsening symptoms or concerns  Followup: Return if symptoms worsen or fail to improve.  Cathlean Cower, MD 08/13/2020 11:52 PM [facility]

## 2020-08-13 NOTE — Patient Instructions (Signed)
Please take all new medication as prescribed- the generic easy to take singulair 10 mg per day to avoid allergy symptoms including the itching and rash  Please take all new medication as prescribed - the OTC Zyrtec (or OTC Allegra if the Zyrtec makes you feel funny)  Please continue all other medications as before, and refills have been done if requested.  Please have the pharmacy call with any other refills you may need.  Please continue your efforts at being more active, low cholesterol diet, and weight control.  Please keep your appointments with your specialists as you may have planned

## 2020-10-11 ENCOUNTER — Ambulatory Visit: Payer: Medicare HMO

## 2020-10-12 ENCOUNTER — Ambulatory Visit: Payer: Medicare HMO

## 2020-10-14 DIAGNOSIS — L570 Actinic keratosis: Secondary | ICD-10-CM | POA: Diagnosis not present

## 2020-10-16 ENCOUNTER — Other Ambulatory Visit: Payer: Self-pay

## 2020-10-16 ENCOUNTER — Ambulatory Visit (INDEPENDENT_AMBULATORY_CARE_PROVIDER_SITE_OTHER): Payer: Medicare HMO

## 2020-10-16 DIAGNOSIS — Z23 Encounter for immunization: Secondary | ICD-10-CM

## 2020-10-28 DIAGNOSIS — Z8546 Personal history of malignant neoplasm of prostate: Secondary | ICD-10-CM | POA: Diagnosis not present

## 2020-11-04 DIAGNOSIS — Z8546 Personal history of malignant neoplasm of prostate: Secondary | ICD-10-CM | POA: Diagnosis not present

## 2020-11-04 DIAGNOSIS — R311 Benign essential microscopic hematuria: Secondary | ICD-10-CM | POA: Diagnosis not present

## 2020-11-04 DIAGNOSIS — N5235 Erectile dysfunction following radiation therapy: Secondary | ICD-10-CM | POA: Diagnosis not present

## 2020-11-11 DIAGNOSIS — D1800 Hemangioma unspecified site: Secondary | ICD-10-CM | POA: Diagnosis not present

## 2020-11-11 DIAGNOSIS — N200 Calculus of kidney: Secondary | ICD-10-CM | POA: Diagnosis not present

## 2020-11-11 DIAGNOSIS — N281 Cyst of kidney, acquired: Secondary | ICD-10-CM | POA: Diagnosis not present

## 2020-11-11 DIAGNOSIS — K5730 Diverticulosis of large intestine without perforation or abscess without bleeding: Secondary | ICD-10-CM | POA: Diagnosis not present

## 2020-11-11 DIAGNOSIS — R311 Benign essential microscopic hematuria: Secondary | ICD-10-CM | POA: Diagnosis not present

## 2020-12-03 ENCOUNTER — Ambulatory Visit (INDEPENDENT_AMBULATORY_CARE_PROVIDER_SITE_OTHER): Payer: Medicare HMO | Admitting: Allergy and Immunology

## 2020-12-03 ENCOUNTER — Other Ambulatory Visit: Payer: Self-pay

## 2020-12-03 VITALS — BP 120/72 | HR 67 | Temp 97.6°F | Resp 16 | Ht 67.0 in | Wt 177.8 lb

## 2020-12-03 DIAGNOSIS — J3089 Other allergic rhinitis: Secondary | ICD-10-CM

## 2020-12-03 DIAGNOSIS — H6123 Impacted cerumen, bilateral: Secondary | ICD-10-CM

## 2020-12-03 MED ORDER — MOMETASONE FUROATE 50 MCG/ACT NA SUSP: NASAL | 11 refills | Status: DC

## 2020-12-03 MED ORDER — AZELASTINE HCL 0.1 % NA SOLN: 2.0000 | Freq: Every day | NASAL | 11 refills | Status: DC

## 2020-12-03 MED ORDER — MONTELUKAST SODIUM 10 MG PO TABS: 10.0000 mg | ORAL_TABLET | Freq: Every day | ORAL | 11 refills | Status: DC

## 2020-12-03 MED ORDER — METHYLPREDNISOLONE ACETATE 80 MG/ML IJ SUSP
80.0000 mg | Freq: Once | INTRAMUSCULAR | Status: AC
  Administered 2020-12-03: 80 mg via INTRAMUSCULAR

## 2020-12-03 NOTE — Progress Notes (Signed)
[place] [facility] [place] [facility] [place]   Follow-up Note  Referring Provider: Cassandria Anger, MD Primary Provider: Cassandria Anger, MD Date of Office Visit: 12/03/2020  Subjective:   Jeremy [name] (DOB: Jun 30, 1940) is a 80 y.o. male who returns to the [facility] on 12/03/2020 in re-evaluation of the following:  HPI: [name] presents to this clinic in evaluation of allergic rhinitis.  I last saw him in this clinic on 15 March 2019.  He has really done very well but unfortunately over the course of the past 3 months he has developed very significant nasal congestion at nighttime that actually awakens him at night with a very dry mouth secondary to mouth breathing.  He has not been using both of his nasal sprays but is only using one of his nasal sprays.  During the daytime he is fine.  He can smell and he can taste with no difficulty and he does not have any headaches or ugly nasal discharge.  There has not really been an obvious provoking factor giving rise to this issue.  He has not had a significant environmental change or started any new medications or supplements.  During his last visit we referred him onto ENT for his impacted ear canals which was causing some degree of hearing loss and he did have those canals cleared by ENT with a good result regarding his hearing.  He has received 4 COVID vaccinations and has received this year's flu vaccine.  Allergies as of 12/03/2020       Reactions   Aspirin    REACTION: gi        Medication List    mometasone 50 MCG/ACT nasal spray Commonly known as: NASONEX Use 2 sprays in each nostril every night   Vitamin D3 50 MCG (2000 UT) capsule Take 1 capsule (2,000 Units total) by mouth daily.    Past Medical History:  Diagnosis Date   AK (actinic keratosis)    Allergic rhinitis    History of colonic polyps    Dr. Ardis Hughs- Due 2018   History of skin cancer    Osteopenia    Prostate  cancer ([place])    hx of s/p XRT, seeds Dr. Risa Grill    Past Surgical History:  Procedure Laterality Date   INSERTION PROSTATE RADIATION SEED     KNEE ARTHROSCOPY Right    KNEE SURGERY Left    TONSILLECTOMY     XRT  2008    Review of systems negative except as noted in HPI / PMHx or noted below:  Review of Systems  Constitutional: Negative.   HENT: Negative.    Eyes: Negative.   Respiratory: Negative.    Cardiovascular: Negative.   Gastrointestinal: Negative.   Genitourinary: Negative.   Musculoskeletal: Negative.   Skin: Negative.   Neurological: Negative.   Endo/Heme/Allergies: Negative.   Psychiatric/Behavioral: Negative.      Objective:   Vitals:   12/03/20 1103  BP: 120/72  Pulse: 67  Resp: 16  Temp: 97.6 F (36.4 C)  SpO2: 98%   Height: 5\' 7"  (170.2 cm)  Weight: 177 lb 12.8 oz (80.6 kg)   Physical Exam Constitutional:      Appearance: He is not diaphoretic.  HENT:     Head: Normocephalic.     Right Ear: External ear normal. There is impacted cerumen.     Left Ear: External ear normal. There is impacted cerumen.     Nose: Nose normal. No  mucosal edema or rhinorrhea.     Mouth/Throat:     Pharynx: Uvula midline. No oropharyngeal exudate.  Eyes:     Conjunctiva/sclera: Conjunctivae normal.  Neck:     Thyroid: No thyromegaly.     Trachea: Trachea normal. No tracheal tenderness or tracheal deviation.  Cardiovascular:     Rate and Rhythm: Normal rate and regular rhythm.     Heart sounds: Normal heart sounds, S1 normal and S2 normal. No murmur heard. Pulmonary:     Effort: No respiratory distress.     Breath sounds: Normal breath sounds. No stridor. No wheezing or rales.  Lymphadenopathy:     Head:     Right side of head: No tonsillar adenopathy.     Left side of head: No tonsillar adenopathy.     Cervical: No cervical adenopathy.  Skin:    Findings: No erythema or rash.     Nails: There is no clubbing.  Neurological:     Mental Status: He is alert.     Diagnostics:   Assessment and Plan:   1. Other allergic rhinitis   2. 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 Every night use the following during periods of upper airway symptoms:   A.  Mometasone two sprays each nostril   B. Azelastine 2 sprays each nostril  C. Montelukast 10 mg tablet  2. If needed:   A. over-the-counter antihistamine  B. Over the counter nasal saline  3.  Depo-Medrol 80 IM delivered in clinic today  4. Visit with ENT about ear impaction.  5. Return to clinic in 1 year or earlier if problem  We will treat ken with a systemic steroid.  His last systemic steroid was over 18 months ago.  I have encouraged him to consistently use a combination of topical mometasone and azelastine and he can continue to use montelukast if he feels that this helps.  He does need to visit with ENT about getting his ear canals cleared.  He will probably need to visit with ENT every 6 months or so to clear out his ears.  He will keep in contact with me noting his response to this approach.  If he does well I will see him back in this clinic in 1 year or earlier if there is a problem.   Allena Katz, MD All[facility] / [facility] [place]

## 2020-12-03 NOTE — Patient Instructions (Signed)
  1. Every night use the following during periods of upper airway symptoms:   A.  Mometasone two sprays each nostril   B. Azelastine 2 sprays each nostril  C. Montelukast 10 mg tablet  2. If needed:   A. over-the-counter antihistamine  B. Over the counter nasal saline  3.  Depo-Medrol 80 IM delivered in clinic today  4. Visit with ENT about ear impaction.  5. Return to clinic in 1 year or earlier if problem

## 2020-12-04 ENCOUNTER — Encounter: Payer: Self-pay | Admitting: Allergy and Immunology

## 2020-12-09 DIAGNOSIS — L570 Actinic keratosis: Secondary | ICD-10-CM | POA: Diagnosis not present

## 2020-12-10 DIAGNOSIS — H6123 Impacted cerumen, bilateral: Secondary | ICD-10-CM | POA: Diagnosis not present

## 2020-12-12 ENCOUNTER — Encounter: Payer: Self-pay | Admitting: Internal Medicine

## 2020-12-12 ENCOUNTER — Other Ambulatory Visit: Payer: Self-pay

## 2020-12-12 ENCOUNTER — Ambulatory Visit (INDEPENDENT_AMBULATORY_CARE_PROVIDER_SITE_OTHER): Payer: Medicare HMO | Admitting: Internal Medicine

## 2020-12-12 VITALS — BP 132/78 | HR 60 | Temp 97.6°F | Ht 67.0 in | Wt 173.0 lb

## 2020-12-12 DIAGNOSIS — E785 Hyperlipidemia, unspecified: Secondary | ICD-10-CM | POA: Diagnosis not present

## 2020-12-12 DIAGNOSIS — J301 Allergic rhinitis due to pollen: Secondary | ICD-10-CM

## 2020-12-12 DIAGNOSIS — Z8546 Personal history of malignant neoplasm of prostate: Secondary | ICD-10-CM

## 2020-12-12 DIAGNOSIS — R030 Elevated blood-pressure reading, without diagnosis of hypertension: Secondary | ICD-10-CM

## 2020-12-12 DIAGNOSIS — Z0000 Encounter for general adult medical examination without abnormal findings: Secondary | ICD-10-CM

## 2020-12-12 LAB — COMPREHENSIVE METABOLIC PANEL
ALT: 22 U/L (ref 0–53)
AST: 21 U/L (ref 0–37)
Albumin: 4 g/dL (ref 3.5–5.2)
Alkaline Phosphatase: 68 U/L (ref 39–117)
BUN: 20 mg/dL (ref 6–23)
CO2: 29 mEq/L (ref 19–32)
Calcium: 9.3 mg/dL (ref 8.4–10.5)
Chloride: 105 mEq/L (ref 96–112)
Creatinine, Ser: 0.86 mg/dL (ref 0.40–1.50)
GFR: 81.72 mL/min (ref >60.00)
Glucose, Bld: 90 mg/dL (ref 70–99)
Potassium: 4.4 mEq/L (ref 3.5–5.1)
Sodium: 140 mEq/L (ref 135–145)
Total Bilirubin: 0.8 mg/dL (ref 0.2–1.2)
Total Protein: 7 g/dL (ref 6.0–8.3)

## 2020-12-12 LAB — LIPID PANEL
Cholesterol: 140 mg/dL (ref 0–200)
HDL: 42.6 mg/dL (ref >39.00)
LDL Cholesterol: 86 mg/dL (ref 0–99)
NonHDL: 97.01
Total CHOL/HDL Ratio: 3
Triglycerides: 55 mg/dL (ref 0.0–149.0)
VLDL: 11 mg/dL (ref 0.0–40.0)

## 2020-12-12 LAB — TSH: TSH: 1.38 u[IU]/mL (ref 0.35–5.50)

## 2020-12-12 NOTE — Assessment & Plan Note (Deleted)
We discussed age appropriate health related issues, including available/recomended screening tests and vaccinations. We discussed a need for adhering to healthy diet and exercise. Labs were ordered to be later reviewed . All questions were answered. CT ca scoring nl "0" score, no CAD 12/19 Colon 2018

## 2020-12-12 NOTE — Assessment & Plan Note (Addendum)
CT ca scoring nl "0" score, no CAD 12/19 Check lipids

## 2020-12-12 NOTE — Progress Notes (Signed)
Subjective:  Patient ID: Jeremy [name], male    DOB: 04/28/1940  Age: 80 y.o. MRN: [id number]  CC: Follow-up (6 month f/u)   HPI Jeremy [name] presents for prostate CA, allergies  Outpatient Medications Prior to Visit  Medication Sig Dispense Refill   azelastine (ASTELIN) 0.1 % nasal spray Place 2 sprays into both nostrils daily. 30 mL 11   Cholecalciferol (VITAMIN D3) 50 MCG (2000 UT) capsule Take 1 capsule (2,000 Units total) by mouth daily. 100 capsule 3   mometasone (NASONEX) 50 MCG/ACT nasal spray Use 2 sprays in each nostril every night 17 g 11   montelukast (SINGULAIR) 10 MG tablet Take 1 tablet (10 mg total) by mouth at bedtime. 30 tablet 11   No facility-administered medications prior to visit.    ROS: Review of Systems  Constitutional:  Negative for appetite change, fatigue and unexpected weight change.  HENT:  Negative for congestion, nosebleeds, sneezing, sore throat and trouble swallowing.   Eyes:  Negative for itching and visual disturbance.  Respiratory:  Negative for cough.   Cardiovascular:  Negative for chest pain, palpitations and leg swelling.  Gastrointestinal:  Negative for abdominal distention, blood in stool, diarrhea and nausea.  Genitourinary:  Negative for frequency and hematuria.  Musculoskeletal:  Negative for back pain, gait problem, joint swelling and neck pain.  Skin:  Negative for rash.  Neurological:  Negative for dizziness, tremors, speech difficulty and weakness.  Psychiatric/Behavioral:  Negative for agitation, dysphoric mood and sleep disturbance. The patient is not nervous/anxious.    Objective:  BP 132/78 (BP Location: Left Arm)   Pulse 60   Temp 97.6 F (36.4 C) (Oral)   Ht 5\' 7"  (1.702 m)   Wt 173 lb (78.5 kg)   SpO2 97%   BMI 27.10 kg/m   BP Readings from Last 3 Encounters:  12/12/20 132/78  12/03/20 120/72  08/13/20 140/80    Wt Readings from Last 3 Encounters:  12/12/20 173 lb (78.5 kg)  12/03/20 177 lb 12.8  oz (80.6 kg)  08/13/20 173 lb (78.5 kg)    Physical Exam Constitutional:      General: He is not in acute distress.    Appearance: He is well-developed.     Comments: NAD  Eyes:     Conjunctiva/sclera: Conjunctivae normal.     Pupils: Pupils are equal, round, and reactive to light.  Neck:     Thyroid: No thyromegaly.     Vascular: No JVD.  Cardiovascular:     Rate and Rhythm: Normal rate and regular rhythm.     Heart sounds: Normal heart sounds. No murmur heard.   No friction rub. No gallop.  Pulmonary:     Effort: Pulmonary effort is normal. No respiratory distress.     Breath sounds: Normal breath sounds. No wheezing or rales.  Chest:     Chest wall: No tenderness.  Abdominal:     General: Bowel sounds are normal. There is no distension.     Palpations: Abdomen is soft. There is no mass.     Tenderness: There is no abdominal tenderness. There is no guarding or rebound.  Musculoskeletal:        General: No tenderness. Normal range of motion.     Cervical back: Normal range of motion.  Lymphadenopathy:     Cervical: No cervical adenopathy.  Skin:    General: Skin is warm and dry.     Findings: No rash.  Neurological:     Mental Status:  He is alert and oriented to person, place, and time.     Cranial Nerves: No cranial nerve deficit.     Motor: No abnormal muscle tone.     Coordination: Coordination normal.     Gait: Gait normal.     Deep Tendon Reflexes: Reflexes are normal and symmetric.  Psychiatric:        Behavior: Behavior normal.        Thought Content: Thought content normal.        Judgment: Judgment normal.  No bruit B Rectal - per Urology  Lab Results  Component Value Date   WBC 4.9 05/30/2020   HGB 15.1 05/30/2020   HCT 43.3 05/30/2020   PLT 186.0 05/30/2020   GLUCOSE 92 05/30/2020   CHOL 157 05/30/2020   TRIG 60.0 05/30/2020   HDL 43.50 05/30/2020   LDLCALC 102 (H) 05/30/2020   ALT 19 05/30/2020   AST 20 05/30/2020   NA 138 05/30/2020   K  4.5 05/30/2020   CL 104 05/30/2020   CREATININE 0.82 05/30/2020   BUN 15 05/30/2020   CO2 28 05/30/2020   TSH 1.33 05/30/2020   PSA 0.07 (L) 05/30/2020   INR 1.0 ratio 03/27/2008    No results found.  Assessment & Plan:   Problem List Items Addressed This Visit     Allergic rhinitis    Stable Cont w/Claritin, Flonase      Dyslipidemia - Primary    CT ca scoring nl "0" score, no CAD 12/19 Check lipids      Relevant Orders   TSH   Lipid panel   Comprehensive metabolic panel   Elevated BP without diagnosis of hypertension    Monitor BP      PROSTATE CANCER, HX OF    F/u w/ Dr Lovena Neighbours      Well adult exam   Relevant Orders   TSH   Lipid panel   Comprehensive metabolic panel      No orders of the defined types were placed in this encounter.     Follow-up: Return in about 6 months (around 06/12/2021) for Wellness Exam.  Walker Kehr, MD

## 2020-12-12 NOTE — Assessment & Plan Note (Signed)
F/u w/ Dr Lovena Neighbours

## 2020-12-12 NOTE — Assessment & Plan Note (Signed)
Monitor BP 

## 2020-12-12 NOTE — Assessment & Plan Note (Signed)
Stable Cont w/Claritin, Flonase

## 2020-12-19 DIAGNOSIS — N200 Calculus of kidney: Secondary | ICD-10-CM | POA: Diagnosis not present

## 2020-12-19 DIAGNOSIS — C61 Malignant neoplasm of prostate: Secondary | ICD-10-CM | POA: Diagnosis not present

## 2020-12-27 DIAGNOSIS — H5203 Hypermetropia, bilateral: Secondary | ICD-10-CM | POA: Diagnosis not present

## 2021-01-27 ENCOUNTER — Telehealth: Payer: Self-pay | Admitting: Internal Medicine

## 2021-01-27 NOTE — Telephone Encounter (Signed)
LVM for pt to rtn my call to schedule AWV with [facility]. Please schedule this appt if pt calls the office.  °

## 2021-01-28 DIAGNOSIS — L570 Actinic keratosis: Secondary | ICD-10-CM | POA: Diagnosis not present

## 2021-02-21 ENCOUNTER — Other Ambulatory Visit: Payer: Self-pay

## 2021-02-21 ENCOUNTER — Ambulatory Visit (INDEPENDENT_AMBULATORY_CARE_PROVIDER_SITE_OTHER): Payer: Medicare HMO

## 2021-02-21 DIAGNOSIS — Z0000 Encounter for general adult medical examination without abnormal findings: Secondary | ICD-10-CM

## 2021-02-21 NOTE — Progress Notes (Addendum)
Virtual Visit via Telephone Note  I connected with  Jeremy [name] on 02/21/21 at  2:30 PM EST by telephone and verified that I am speaking with the correct person using two identifiers.  Medicare Annual Wellness visit completed telephonically due to Covid-19 pandemic.   Persons participating in this call: This Health Coach and this patient.   Location: Patient: home Provider: office   I discussed the limitations, risks, security and privacy concerns of performing an evaluation and management service by telephone and the availability of in person appointments. The patient expressed understanding and agreed to proceed.  Unable to perform video visit due to video visit attempted and failed and/or patient does not have video capability.   Some vital signs may be absent or patient reported.   Willette Brace, LPN   Subjective:   Jeremy [name] is a 81 y.o. male who presents for Medicare Annual/Subsequent preventive examination.  Review of Systems     Cardiac Risk Factors include: advanced age (>86men, >76 women);dyslipidemia;male gender (smoke cigars once in a while)     Objective:    There were no vitals filed for this visit. There is no height or weight on file to calculate BMI.  Advanced Directives 02/21/2021 12/27/2019 04/21/2019 10/12/2016 11/07/2015  Does Patient Have a Medical Advance Directive? Yes Yes Yes Yes Yes  Type of Advance Directive [place];Living will [place];Living will  Does patient want to make changes to medical advance directive? - No - Patient declined - - No - Patient declined  Copy of [place] in Chart? No - copy requested - - No - copy requested No - copy requested    Current Medications (verified) Outpatient Encounter Medications as of 02/21/2021  Medication Sig   Cholecalciferol (VITAMIN D3) 50 MCG (2000 UT) capsule Take 1 capsule (2,000 Units total)  by mouth daily.   montelukast (SINGULAIR) 10 MG tablet Take 1 tablet (10 mg total) by mouth at bedtime.   PFIZER COVID-19 VAC BIVALENT injection    [DISCONTINUED] azelastine (ASTELIN) 0.1 % nasal spray Place 2 sprays into both nostrils daily.   [DISCONTINUED] mometasone (NASONEX) 50 MCG/ACT nasal spray Use 2 sprays in each nostril every night   No facility-administered encounter medications on file as of 02/21/2021.    Allergies (verified) Aspirin   History: Past Medical History:  Diagnosis Date   AK (actinic keratosis)    Allergic rhinitis    History of colonic polyps    Dr. Ardis Hughs- Due 2018   History of skin cancer    Osteopenia    Prostate cancer ([place])    hx of s/p XRT, seeds Dr. Risa Grill   Past Surgical History:  Procedure Laterality Date   INSERTION PROSTATE RADIATION SEED     KNEE ARTHROSCOPY Right    KNEE SURGERY Left    TONSILLECTOMY     XRT  2008   Family History  Problem Relation Age of Onset   Liver disease Father    Cancer Father        pancreas ?   Cancer - Other Other        intestinal cancer   Social History   Socioeconomic History   Marital status: Married    Spouse name: Not on file   Number of children: 2   Years of education: Not on file   Highest education level: Not on file  Occupational History   Occupation: retired from [employer]  Tobacco Use   Smoking status: Some Days    Types: Cigars   Smokeless tobacco: Never   Tobacco comments:    2 cigars a month  Vaping Use   Vaping Use: Never used  Substance and Sexual Activity   Alcohol use: Yes    Alcohol/week: 2.0 standard drinks    Types: 2 Cans of beer per week   Drug use: No   Sexual activity: Yes  Other Topics Concern   Not on file  Social History Narrative   Not on file   Social Determinants of Health   Financial Resource Strain: Low Risk    Difficulty of Paying Living Expenses: Not hard at all  Food Insecurity: No Food Insecurity   Worried About Charity fundraiser in the Last  Year: Never true   [place] in the Last Year: Never true  Transportation Needs: No Transportation Needs   Lack of Transportation (Medical): No   Lack of Transportation (Non-Medical): No  Physical Activity: Sufficiently Active   Days of Exercise per Week: 4 days   Minutes of Exercise per Session: 120 min  Stress: No Stress Concern Present   Feeling of Stress : Not at all  Social Connections: Moderately Integrated   Frequency of Communication with Friends and Family: More than three times a week   Frequency of Social Gatherings with Friends and Family: More than three times a week   Attends Religious Services: More than 4 times per year   Active Member of [employer] or Organizations: No   Attends Music therapist: Never   Marital Status: Married    Tobacco Counseling Ready to quit: Not Answered Counseling given: Not Answered Tobacco comments: 2 cigars a month   Clinical Intake:  Pre-visit preparation completed: Yes  Pain : No/denies pain     BMI - recorded: 26.5 Nutritional Risks: None Diabetes: No  How often do you need to have someone help you when you read instructions, pamphlets, or other written materials from your doctor or pharmacy?: 1 - Never  Diabetic?No  Interpreter Needed?: No  Information entered by :: Charlott Rakes, LPN   Activities of Daily Living In your present state of health, do you have any difficulty performing the following activities: 02/21/2021  Hearing? N  Vision? N  Difficulty concentrating or making decisions? N  Walking or climbing stairs? N  Dressing or bathing? N  Doing errands, shopping? N  Preparing Food and eating ? N  Using the Toilet? N  In the past six months, have you accidently leaked urine? N  Do you have problems with loss of bowel control? N  Managing your Medications? N  Managing your Finances? N  Housekeeping or managing your Housekeeping? N  Some recent data might be hidden    Patient Care  Team: Plotnikov, Evie Lacks, MD as PCP - General Rana Snare, MD (Inactive) (Urology) Macarthur Critchley, [place] as Referring Physician (Optometry) Lovena Neighbours Conception Oms, MD as Consulting Physician (Urology)  Indicate any recent Medical Services you may have received from other than [facility] providers in the past year (date may be approximate).     Assessment:   This is a routine wellness examination for Jeremy [name].  Hearing/Vision screen Hearing Screening - Comments:: Pt denies any hearing issues  Vision Screening - Comments:: Pt follows up with Dr Macarthur Critchley for annual eye exams   Dietary issues and exercise activities discussed: Current Exercise Habits: Home exercise routine, Type of exercise: Other - see comments (golfing  can be longer), Time (Minutes): > 60, Frequency (Times/Week): 4, Weekly Exercise (Minutes/Week): 0   Goals Addressed             This Visit's Progress    Patient Stated       Stay active and  healthy        Depression Screen PHQ 2/9 Scores 02/21/2021 12/27/2019 05/29/2019 11/15/2017 11/12/2016 11/07/2015 10/22/2014  PHQ - 2 Score 0 0 0 0 0 0 0    Fall Risk Fall Risk  02/21/2021 12/27/2019 05/29/2019 11/15/2017 11/12/2016  Falls in the past year? 0 0 0 0 No  Number falls in past yr: 0 0 - - -  Injury with Fall? 0 0 - - -  Risk for fall due to : Impaired vision No Fall Risks - - -  Follow up Falls prevention discussed Falls evaluation completed Falls evaluation completed Falls evaluation completed -    FALL RISK PREVENTION PERTAINING TO THE HOME:  Any stairs in or around the home? No  If so, are there any without handrails? No  Home free of loose throw rugs in walkways, pet beds, electrical cords, etc? Yes  Adequate lighting in your home to reduce risk of falls? Yes   ASSISTIVE DEVICES UTILIZED TO PREVENT FALLS:  Life alert? No wife has  Use of a cane, walker or w/c? No  Grab bars in the bathroom? Yes  Shower chair or bench in shower? Yes  Elevated toilet seat  or a handicapped toilet? Yes   TIMED UP AND GO:  Was the test performed? No .   Cognitive Function:     6CIT Screen 02/21/2021  What Year? 0 points  What month? 0 points  What time? 0 points  Count back from 20 0 points  Months in reverse 0 points  Repeat phrase 0 points  Total Score 0    Immunizations Immunization History  Administered Date(s) Administered   Fluad Quad(high Dose 65+) 10/05/2018, 10/27/2019, 10/16/2020   Influenza Split 10/08/2011   Influenza Whole 11/18/2006, 10/31/2007, 09/22/2010   Influenza, High Dose Seasonal PF 11/07/2015, 11/12/2016, 11/15/2017   Influenza,inj,Quad PF,6+ Mos 10/14/2012, 10/20/2013, 10/22/2014   PFIZER(Purple Top)SARS-COV-2 Vaccination 02/06/2019, 02/27/2019, 11/11/2019   Pneumococcal Conjugate-13 04/22/2015   Pneumococcal Polysaccharide-23 08/22/2009, 05/30/2020   Td 09/13/2008   Tdap 09/28/2018   Zoster Recombinat (Shingrix) 03/11/2018, 07/18/2018   Zoster, Live 10/18/2009    TDAP status: Up to date  Flu Vaccine status: Up to date  Pneumococcal vaccine status: Up to date  Covid-19 vaccine status: Completed vaccines  Qualifies for Shingles Vaccine? Yes   Zostavax completed Yes   Shingrix Completed?: Yes  Screening Tests Health Maintenance  Topic Date Due   COVID-19 Vaccine (4 - Booster for Pfizer series) 01/06/2020   TETANUS/TDAP  09/27/2028   Pneumonia Vaccine 60+ Years old  Completed   INFLUENZA VACCINE  Completed   Zoster Vaccines- Shingrix  Completed   HPV VACCINES  Aged Out    Health Maintenance  Health Maintenance Due  Topic Date Due   COVID-19 Vaccine (4 - Booster for Pfizer series) 01/06/2020    Colorectal cancer screening: No longer required.    Additional Screening:   Vision Screening: Recommended annual ophthalmology exams for early detection of glaucoma and other disorders of the eye. Is the patient up to date with their annual eye exam?  Yes  Who is the provider or what is the name of the  office in which the patient attends annual eye exams? Dr Macarthur Critchley  If pt is not established with a provider, would they like to be referred to a provider to establish care? No .   Dental Screening: Recommended annual dental exams for proper oral hygiene  Community Resource Referral / Chronic Care Management: CRR required this visit?  No   CCM required this visit?  No      Plan:     I have personally reviewed and noted the following in the patients chart:   Medical and social history Use of alcohol, tobacco or illicit drugs  Current medications and supplements including opioid prescriptions. Patient is not currently taking opioid prescriptions. Functional ability and status Nutritional status Physical activity Advanced directives List of other physicians Hospitalizations, surgeries, and ER visits in previous 12 months Vitals Screenings to include cognitive, depression, and falls Referrals and appointments  In addition, I have reviewed and discussed with patient certain preventive protocols, quality metrics, and best practice recommendations. A written personalized care plan for preventive services as well as general preventive health recommendations were provided to patient.     Willette Brace, LPN   [id number]   Nurse Notes: None   Medical screening examination/treatment/procedure(s) were performed by non-physician practitioner and as supervising physician I was immediately available for consultation/collaboration.  I agree with above. Lew Dawes, MD

## 2021-02-21 NOTE — Patient Instructions (Signed)
[name] , Thank you for taking time to come for your Medicare Wellness Visit. I appreciate your ongoing commitment to your health goals. Please review the following plan we discussed and let me know if I can assist you in the future.   Screening recommendations/referrals: Colonoscopy: No longer required  Recommended yearly ophthalmology/optometry visit for glaucoma screening and checkup Recommended yearly dental visit for hygiene and checkup  Vaccinations: Influenza vaccine: Done 10/16/20 repeat every year  Pneumococcal vaccine: Up to date Tdap vaccine: Done 09/28/18 repeat veery 10 year  Shingles vaccine: Completed 2/28, 07/18/18   Covid-19: Completed 1/25, 2/15, 11/11/19  Advanced directives: Please bring a copy of your health care power of attorney and living will to the office at your convenience.  Conditions/risks identified: Stay active and healthy  Next appointment: Follow up in one year for your annual wellness visit.   Preventive Care 74 Years and Older, Male Preventive care refers to lifestyle choices and visits with your health care provider that can promote health and wellness. What does preventive care include? A yearly physical exam. This is also called an annual well check. Dental exams once or twice a year. Routine eye exams. Ask your health care provider how often you should have your eyes checked. Personal lifestyle choices, including: Daily care of your teeth and gums. Regular physical activity. Eating a healthy diet. Avoiding tobacco and drug use. Limiting alcohol use. Practicing safe sex. Taking low doses of aspirin every day. Taking vitamin and mineral supplements as recommended by your health care provider. What happens during an annual well check? The services and screenings done by your health care provider during your annual well check will depend on your age, overall health, lifestyle risk factors, and family history of disease. Counseling  Your  health care provider may ask you questions about your: Alcohol use. Tobacco use. Drug use. Emotional well-being. Home and relationship well-being. Sexual activity. Eating habits. History of falls. Memory and ability to understand (cognition). Work and work Statistician. Screening  You may have the following tests or measurements: Height, weight, and BMI. Blood pressure. Lipid and cholesterol levels. These may be checked every 5 years, or more frequently if you are over 40 years old. Skin check. Lung cancer screening. You may have this screening every year starting at age 55 if you have a 30-pack-year history of smoking and currently smoke or have quit within the past 15 years. Fecal occult blood test (FOBT) of the stool. You may have this test every year starting at age 67. Flexible sigmoidoscopy or colonoscopy. You may have a sigmoidoscopy every 5 years or a colonoscopy every 10 years starting at age 81. Prostate cancer screening. Recommendations will vary depending on your family history and other risks. Hepatitis C blood test. Hepatitis B blood test. Sexually transmitted disease (STD) testing. Diabetes screening. This is done by checking your blood sugar (glucose) after you have not eaten for a while (fasting). You may have this done every 1-3 years. Abdominal aortic aneurysm (AAA) screening. You may need this if you are a current or former smoker. Osteoporosis. You may be screened starting at age 2 if you are at high risk. Talk with your health care provider about your test results, treatment options, and if necessary, the need for more tests. Vaccines  Your health care provider may recommend certain vaccines, such as: Influenza vaccine. This is recommended every year. Tetanus, diphtheria, and acellular pertussis (Tdap, Td) vaccine. You may need a Td booster every 10 years. Zoster  vaccine. You may need this after age 6. Pneumococcal 13-valent conjugate (PCV13) vaccine. One dose  is recommended after age 23. Pneumococcal polysaccharide (PPSV23) vaccine. One dose is recommended after age 64. Talk to your health care provider about which screenings and vaccines you need and how often you need them. This information is not intended to replace advice given to you by your health care provider. Make sure you discuss any questions you have with your health care provider. Document Released: 01/25/2015 Document Revised: 09/18/2015 Document Reviewed: 10/30/2014 Elsevier Interactive Patient Education  2017 Post Prevention in the Home Falls can cause injuries. They can happen to people of all ages. There are many things you can do to make your home safe and to help prevent falls. What can I do on the outside of my home? Regularly fix the edges of walkways and driveways and fix any cracks. Remove anything that might make you trip as you walk through a door, such as a raised step or threshold. Trim any bushes or trees on the path to your home. Use bright outdoor lighting. Clear any walking paths of anything that might make someone trip, such as rocks or tools. Regularly check to see if handrails are loose or broken. Make sure that both sides of any steps have handrails. Any raised decks and porches should have guardrails on the edges. Have any leaves, snow, or ice cleared regularly. Use sand or salt on walking paths during winter. Clean up any spills in your garage right away. This includes oil or grease spills. What can I do in the bathroom? Use night lights. Install grab bars by the toilet and in the tub and shower. Do not use towel bars as grab bars. Use non-skid mats or decals in the tub or shower. If you need to sit down in the shower, use a plastic, non-slip stool. Keep the floor dry. Clean up any water that spills on the floor as soon as it happens. Remove soap buildup in the tub or shower regularly. Attach bath mats securely with double-sided non-slip rug  tape. Do not have throw rugs and other things on the floor that can make you trip. What can I do in the bedroom? Use night lights. Make sure that you have a light by your bed that is easy to reach. Do not use any sheets or blankets that are too big for your bed. They should not hang down onto the floor. Have a firm chair that has side arms. You can use this for support while you get dressed. Do not have throw rugs and other things on the floor that can make you trip. What can I do in the kitchen? Clean up any spills right away. Avoid walking on wet floors. Keep items that you use a lot in easy-to-reach places. If you need to reach something above you, use a strong step stool that has a grab bar. Keep electrical cords out of the way. Do not use floor polish or wax that makes floors slippery. If you must use wax, use non-skid floor wax. Do not have throw rugs and other things on the floor that can make you trip. What can I do with my stairs? Do not leave any items on the stairs. Make sure that there are handrails on both sides of the stairs and use them. Fix handrails that are broken or loose. Make sure that handrails are as long as the stairways. Check any carpeting to make sure that it  is firmly attached to the stairs. Fix any carpet that is loose or worn. Avoid having throw rugs at the top or bottom of the stairs. If you do have throw rugs, attach them to the floor with carpet tape. Make sure that you have a light switch at the top of the stairs and the bottom of the stairs. If you do not have them, ask someone to add them for you. What else can I do to help prevent falls? Wear shoes that: Do not have high heels. Have rubber bottoms. Are comfortable and fit you well. Are closed at the toe. Do not wear sandals. If you use a stepladder: Make sure that it is fully opened. Do not climb a closed stepladder. Make sure that both sides of the stepladder are locked into place. Ask someone to  hold it for you, if possible. Clearly mark and make sure that you can see: Any grab bars or handrails. First and last steps. Where the edge of each step is. Use tools that help you move around (mobility aids) if they are needed. These include: Canes. Walkers. Scooters. Crutches. Turn on the lights when you go into a dark area. Replace any light bulbs as soon as they burn out. Set up your furniture so you have a clear path. Avoid moving your furniture around. If any of your floors are uneven, fix them. If there are any pets around you, be aware of where they are. Review your medicines with your doctor. Some medicines can make you feel dizzy. This can increase your chance of falling. Ask your doctor what other things that you can do to help prevent falls. This information is not intended to replace advice given to you by your health care provider. Make sure you discuss any questions you have with your health care provider. Document Released: 10/25/2008 Document Revised: 06/06/2015 Document Reviewed: 02/02/2014 Elsevier Interactive Patient Education  2017 [employer].

## 2021-03-20 ENCOUNTER — Ambulatory Visit: Payer: Medicare HMO | Admitting: Family Medicine

## 2021-04-09 NOTE — Progress Notes (Signed)
Left knee ?Charlann Boxer D.O. ?[facility] ?[place] ?Phone: [phone] ?Subjective:   ?I, Jacqualin Combes, am serving as a scribe for Dr. Hulan Saas. ? ?This visit occurred during the SARS-CoV-2 public health emergency.  Safety protocols were in place, including screening questions prior to the visit, additional usage of staff PPE, and extensive cleaning of exam room while observing appropriate contact time as indicated for disinfecting solutions.  ? ? ?I'm seeing this patient by the request  of:  Plotnikov, Evie Lacks, MD ? ?CC:  ? ?IRJ:[id number]  ?04/03/2020 ?Chronic problem with exacerbation.  Repeat injection given today.  Last injection was November 2020.  Hopefully patient will continue to respond.  Discussed icing regimen.  Continue bracing, icing, topical anti-inflammatories.  Follow-up with me again in 2 months. ? ?Updated 04/10/2021 ?CROSS JORGE is a 81 y.o. male coming in with complaint of left knee pain. Patient states that his pain is worse. Has good and bad days though. Pain mostly over lateral joint line. Pain is now bothering him at night when it was not before. Injection did give him immediate relief.  ?  ? ?Past Medical History:  ?Diagnosis Date  ? AK (actinic keratosis)   ? Allergic rhinitis   ? History of colonic polyps   ? Dr. Ardis Hughs- Due 2018  ? History of skin cancer   ? Osteopenia   ? Prostate cancer ([place])   ? hx of s/p XRT, seeds Dr. Risa Grill  ? ?Past Surgical History:  ?Procedure Laterality Date  ? INSERTION PROSTATE RADIATION SEED    ? KNEE ARTHROSCOPY Right   ? KNEE SURGERY Left   ? TONSILLECTOMY    ? XRT  2008  ? ?Social History  ? ?Socioeconomic History  ? Marital status: Married  ?  Spouse name: Not on file  ? Number of children: 2  ? Years of education: Not on file  ? Highest education level: Not on file  ?Occupational History  ? Occupation: retired from [employer]  ?Tobacco Use  ? Smoking status: Some Days  ?  Types: Cigars  ? Smokeless  tobacco: Never  ? Tobacco comments:  ?  2 cigars a month  ?Vaping Use  ? Vaping Use: Never used  ?Substance and Sexual Activity  ? Alcohol use: Yes  ?  Alcohol/week: 2.0 standard drinks  ?  Types: 2 Cans of beer per week  ? Drug use: No  ? Sexual activity: Yes  ?Other Topics Concern  ? Not on file  ?Social History Narrative  ? Not on file  ? ?Social Determinants of Health  ? ?Financial Resource Strain: Low Risk   ? Difficulty of Paying Living Expenses: Not hard at all  ?Food Insecurity: No Food Insecurity  ? Worried About Charity fundraiser in the Last Year: Never true  ? Ran Out of Food in the Last Year: Never true  ?Transportation Needs: No Transportation Needs  ? Lack of Transportation (Medical): No  ? Lack of Transportation (Non-Medical): No  ?Physical Activity: Sufficiently Active  ? Days of Exercise per Week: 4 days  ? Minutes of Exercise per Session: 120 min  ?Stress: No Stress Concern Present  ? Feeling of Stress : Not at all  ?Social Connections: Moderately Integrated  ? Frequency of Communication with Friends and Family: More than three times a week  ? Frequency of Social Gatherings with Friends and Family: More than three times a week  ? Attends Religious Services: More than  4 times per year  ? Active Member of Clubs or Organizations: No  ? Attends Archivist Meetings: Never  ? Marital Status: Married  ? ?Allergies  ?Allergen Reactions  ? Aspirin   ?  REACTION: gi  ? ?Family History  ?Problem Relation Age of Onset  ? Liver disease Father   ? Cancer Father   ?     pancreas ?  ? Cancer - Other Other   ?     intestinal cancer  ? ? ? ? ?Current Outpatient Medications (Respiratory):  ?  montelukast (SINGULAIR) 10 MG tablet, Take 1 tablet (10 mg total) by mouth at bedtime. ? ? ? ?Current Outpatient Medications (Other):  ?  Cholecalciferol (VITAMIN D3) 50 MCG (2000 UT) capsule, Take 1 capsule (2,000 Units total) by mouth daily. ?  PFIZER COVID-19 VAC BIVALENT injection,  ? ? ?Reviewed prior external  information including notes and imaging from  ?primary care provider ?As well as notes that were available from care everywhere and other healthcare systems. ? ?Past medical history, social, surgical and family history all reviewed in electronic medical record.  No pertanent information unless stated regarding to the chief complaint.  ? ?Review of Systems: ? No headache, visual changes, nausea, vomiting, diarrhea, constipation, dizziness, abdominal pain, skin rash, fevers, chills, night sweats, weight loss, swollen lymph nodes, body aches, joint swelling, chest pain, shortness of breath, mood changes. POSITIVE muscle aches ? ?Objective  ?Blood pressure 124/78, pulse (!) 102, height '5\' 7"'$  (1.702 m), weight 178 lb (80.7 kg), SpO2 97 %. ?  ?General: No apparent distress alert and oriented x3 mood and affect normal, dressed appropriately.  ?HEENT: Pupils equal, extraocular movements intact  ?Respiratory: Patient's speak in full sentences and does not appear short of breath  ?Cardiovascular: 2+ lower extremity edema with some mild hemosiderin deposits, non tender, no erythema  ?Gait antalgic gait noted. 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  Trace effusion noted.  Lacks last 5 degrees of flexion extension.  Mild instability noted with valgus and varus force. ? ?After informed written and verbal consent, patient was seated on exam table. Left knee was prepped with alcohol swab and utilizing anterolateral approach, patient's left knee space was injected with 4:1  marcaine 0.5%: Kenalog '40mg'$ /dL. Patient tolerated the procedure well without immediate complications. ? ?  ?Impression and Recommendations:  ?  ?The above documentation has been reviewed and is accurate and complete Lyndal Pulley, DO ? ? ? ?

## 2021-04-10 ENCOUNTER — Encounter: Payer: Self-pay | Admitting: Family Medicine

## 2021-04-10 ENCOUNTER — Ambulatory Visit (INDEPENDENT_AMBULATORY_CARE_PROVIDER_SITE_OTHER): Payer: Medicare HMO | Admitting: Family Medicine

## 2021-04-10 DIAGNOSIS — M1712 Unilateral primary osteoarthritis, left knee: Secondary | ICD-10-CM

## 2021-04-10 NOTE — Patient Instructions (Addendum)
?1. Every night use the following during periods of upper airway symptoms: ? ? A.  Mometasone two sprays each nostril daily for a stuffy nose.  In the right nostril, point the applicator out toward the right ear. In the left nostril, point the applicator out toward the left ear ? ? B. Azelastine 2 sprays each nostril twice a day as needed for a runny nose ? C. Montelukast 10 mg tablet daily ? D. Nasal saline rinse ? E. Prednisone 10 mg tablets twice a day for the next 3 days. Then stop ?2. If needed: ? ? A. over-the-counter antihistamine ? B. Over the counter nasal saline ? ?3. Return to clinic in 1 year or earlier if problem ? ?Reducing Pollen Exposure ?The American Academy of Allergy, Asthma and Immunology suggests the following steps to reduce your exposure to pollen during allergy seasons. ?Do not hang sheets or clothing out to dry; pollen may collect on these items. ?Do not mow lawns or spend time around freshly cut grass; mowing stirs up pollen. ?Keep windows closed at night.  Keep car windows closed while driving. ?Minimize morning activities outdoors, a time when pollen counts are usually at their highest. ?Stay indoors as much as possible when pollen counts or humidity is high and on windy days when pollen tends to remain in the air longer. ?Use air conditioning when possible.  Many air conditioners have filters that trap the pollen spores. ?Use a HEPA room air filter to remove pollen form the indoor air you breathe. ? ?Control of Mold Allergen ?Mold and fungi can grow on a variety of surfaces provided certain temperature and moisture conditions exist.  Outdoor molds grow on plants, decaying vegetation and soil.  The major outdoor mold, Alternaria and Cladosporium, are found in very high numbers during hot and dry conditions.  Generally, a late Summer - Fall peak is seen for common outdoor fungal spores.  Rain will temporarily lower outdoor mold spore count, but counts rise rapidly when the rainy period  ends.  The most important indoor molds are Aspergillus and Penicillium.  Dark, humid and poorly ventilated basements are ideal sites for mold growth.  The next most common sites of mold growth are the bathroom and the kitchen. ? ?Outdoor [employer] ?Use air conditioning and keep windows closed ?Avoid exposure to decaying vegetation. ?Avoid leaf raking. ?Avoid grain handling. ?Consider wearing a face mask if working in moldy areas. ? ?Indoor Mold Control ?Maintain humidity below 50%. ?Clean washable surfaces with 5% bleach solution. ?Remove sources e.g. Contaminated carpets. ? ? ?Control of Dust Mite Allergen ?Dust mites play a major role in allergic asthma and rhinitis. They occur in environments with high humidity wherever human skin is found. Dust mites absorb humidity from the atmosphere (ie, they do not drink) and feed on organic matter (including shed human and animal skin). Dust mites are a microscopic type of insect that you cannot see with the naked eye. High levels of dust mites have been detected from mattresses, pillows, carpets, upholstered furniture, bed covers, clothes, soft toys and any woven material. The principal allergen of the dust mite is found in its feces. A gram of dust may contain 1,000 mites and 250,000 fecal particles. Mite antigen is easily measured in the air during house cleaning activities. Dust mites do not bite and do not cause harm to humans, other than by triggering allergies/asthma. ? ?Ways to decrease your exposure to dust mites in your home: ? ?1. Encase mattresses, box springs and  pillows with a mite-impermeable barrier or cover ? ?2. Wash sheets, blankets and drapes weekly in hot water (130? F) with detergent and dry them in a dryer on the hot setting. ? ?3. Have the room cleaned frequently with a vacuum cleaner and a damp dust-mop. For carpeting or rugs, vacuuming with a vacuum cleaner equipped with a high-efficiency particulate air (HEPA) filter. The dust mite allergic  individual should not be in a room which is being cleaned and should wait 1 hour after cleaning before going into the room. ? ?4. Do not sleep on upholstered furniture (eg, couches). ? ?5. If possible removing carpeting, upholstered furniture and drapery from the home is ideal. Horizontal blinds should be eliminated in the rooms where the person spends the most time (bedroom, study, television room). Washable vinyl, roller-type shades are optimal. ? ?6. Remove all non-washable stuffed toys from the bedroom. Wash stuffed toys weekly like sheets and blankets above. ? ?7. Reduce indoor humidity to less than 50%. Inexpensive humidity monitors can be purchased at most hardware stores. Do not use a humidifier as can make the problem worse and are not recommended. ? ?  ?

## 2021-04-10 NOTE — Patient Instructions (Signed)
See me in 8 weeks but if good push it out ?Injected knee today ?

## 2021-04-10 NOTE — Assessment & Plan Note (Signed)
Steroid injection given today, we will get approval for viscosupplementation if needed.  Patient has responded extremely well to today's.  We discussed icing regimen and home exercises.  Follow-up again in 8 weeks.  Chronic problem with exacerbation. ?

## 2021-04-10 NOTE — Progress Notes (Signed)
? ?[place] [id number] ?Dept: [phone] ? ?FOLLOW UP NOTE ? ?Patient ID: Jeremy [name], male    DOB: March 19, 1940  Age: 81 y.o. MRN: [id number] ?Date of Office Visit: 04/11/2021 ? ?Assessment  ?Chief Complaint: Allergic Rhinitis  ("My allergies have been really bad, nose runny, stopped up, and sneezing more. I came hoping that I could get a shot.") and Follow-up ? ?HPI ?Jeremy [name] is an 81 year old male who presents to the clinic for a follow up visit. He was last seen in this clinic on 12/03/2020 by Dr. Neldon Mc for evaluation of allergic rhinitis and bilateral cerumen impaction.  At today's visit, he reports allergic rhinitis has been poorly controlled with symptoms including clear thin rhinorrhea, nasal congestion which is worse at night, and sneezing that began about 2 weeks ago.  He continues montelukast 10 mg once a day, mometasone nasal spray 2 sprays in each nostril once a day, azelastine 2 sprays in each nostril once a day, and is not currently using a nasal saline rinse.  He reports poor application technique with mometasone nasal spray.  He reports that he usually gets a steroid injection about once a year to control allergy symptoms.  Of note, he also receives steroid injections for knee condition several times a year.  Long-term effects of steroid use thoroughly discussed. His last environmental allergy skin testing was on 09/11/2014 and was positive to dust mites, molds, and tree pollens.  Dust mite covers are in use on the mattress and pillows.  He denies symptoms of allergic conjunctivitis and is not currently using an allergy eyedrop.  He does have a history of cerumen impaction and reports occasional bilateral ear popping.  He denies a change in his hearing.  His current medications are listed in the chart. ? ?Drug Allergies:  ?Allergies  ?Allergen Reactions  ? Aspirin   ?  REACTION: gi  ? ? ?Physical Exam: ?BP 124/60   Pulse 79   Temp (!) 97.5 ?F (36.4 ?C)  (Temporal)   Resp 20   Ht 5' 7.5" (1.715 m)   Wt 179 lb (81.2 kg)   SpO2 97%   BMI 27.62 kg/m?   ? ?Physical Exam ?Vitals reviewed.  ?Constitutional:   ?   Appearance: Normal appearance.  ?HENT:  ?   Head: Normocephalic and atraumatic.  ?   Right Ear: Tympanic membrane normal.  ?   Left Ear: Tympanic membrane normal.  ?   Nose:  ?   Comments: Bilateral nares slightly erythematous with thin clear nasal drainage noted.  Pharynx normal.  Ears normal.  Eyes normal. ?   Mouth/Throat:  ?   Pharynx: Oropharynx is clear.  ?Eyes:  ?   Conjunctiva/sclera: Conjunctivae normal.  ?Cardiovascular:  ?   Rate and Rhythm: Normal rate and regular rhythm.  ?   Heart sounds: Normal heart sounds. No murmur heard. ?Pulmonary:  ?   Effort: Pulmonary effort is normal.  ?   Breath sounds: Normal breath sounds.  ?   Comments: Lungs clear to auscultation ?Musculoskeletal:     ?   General: Normal range of motion.  ?   Cervical back: Normal range of motion and neck supple.  ?Skin: ?   General: Skin is warm and dry.  ?Neurological:  ?   Mental Status: He is alert and oriented to person, place, and time.  ?Psychiatric:     ?   Mood and Affect: Mood normal.     ?  Behavior: Behavior normal.     ?   Thought Content: Thought content normal.     ?   Judgment: Judgment normal.  ? ? ?Assessment and Plan: ?1. Seasonal allergic rhinitis due to pollen   ? ? ?Meds ordered this encounter  ?Medications  ? mometasone (NASONEX) 50 MCG/ACT nasal spray  ?  Sig: Place 2 sprays into the nose daily.  ?  Dispense:  1 each  ?  Refill:  11  ? azelastine (ASTELIN) 0.1 % nasal spray  ?  Sig: Place 2 sprays into both nostrils 2 (two) times daily. Use in each nostril as directed  ?  Dispense:  30 mL  ?  Refill:  11  ? ? ?Patient Instructions  ? ?1. Every night use the following during periods of upper airway symptoms: ? ? A.  Mometasone two sprays each nostril daily for a stuffy nose.  In the right nostril, point the applicator out toward the right ear. In the left  nostril, point the applicator out toward the left ear ? ? B. Azelastine 2 sprays each nostril twice a day as needed for a runny nose ? C. Montelukast 10 mg tablet daily ? D. Nasal saline rinse ? E. Prednisone 10 mg tablets twice a day for the next 3 days. Then stop ?2. If needed: ? ? A. over-the-counter antihistamine ? B. Over the counter nasal saline ? ?3. Return to clinic in 1 year or earlier if problem ? ? ?Return in about 1 year (around 04/12/2022), or if symptoms worsen or fail to improve. ?  ? ?Thank you for the opportunity to care for this patient.  Please do not hesitate to contact me with questions. ? ?Gareth Morgan, FNP ?[facility] ? ? ? ? ? ?

## 2021-04-11 ENCOUNTER — Encounter: Payer: Self-pay | Admitting: Family Medicine

## 2021-04-11 ENCOUNTER — Ambulatory Visit: Payer: Medicare HMO | Admitting: Family Medicine

## 2021-04-11 VITALS — BP 124/60 | HR 79 | Temp 97.5°F | Resp 20 | Ht 67.5 in | Wt 179.0 lb

## 2021-04-11 DIAGNOSIS — J301 Allergic rhinitis due to pollen: Secondary | ICD-10-CM | POA: Diagnosis not present

## 2021-04-11 MED ORDER — MOMETASONE FUROATE 50 MCG/ACT NA SUSP: 2.0000 | Freq: Every day | NASAL | 11 refills | Status: DC

## 2021-04-11 MED ORDER — AZELASTINE HCL 0.1 % NA SOLN: 2.0000 | Freq: Two times a day (BID) | NASAL | 11 refills | Status: DC

## 2021-05-26 DIAGNOSIS — L570 Actinic keratosis: Secondary | ICD-10-CM | POA: Diagnosis not present

## 2021-05-26 DIAGNOSIS — D485 Neoplasm of uncertain behavior of skin: Secondary | ICD-10-CM | POA: Diagnosis not present

## 2021-06-12 ENCOUNTER — Ambulatory Visit (INDEPENDENT_AMBULATORY_CARE_PROVIDER_SITE_OTHER): Payer: Medicare HMO | Admitting: Internal Medicine

## 2021-06-12 ENCOUNTER — Encounter: Payer: Self-pay | Admitting: Internal Medicine

## 2021-06-12 VITALS — BP 120/70 | HR 65 | Temp 97.7°F | Ht 67.5 in | Wt 172.0 lb

## 2021-06-12 DIAGNOSIS — E785 Hyperlipidemia, unspecified: Secondary | ICD-10-CM

## 2021-06-12 DIAGNOSIS — M545 Low back pain, unspecified: Secondary | ICD-10-CM | POA: Diagnosis not present

## 2021-06-12 DIAGNOSIS — G8929 Other chronic pain: Secondary | ICD-10-CM | POA: Diagnosis not present

## 2021-06-12 DIAGNOSIS — Z0000 Encounter for general adult medical examination without abnormal findings: Secondary | ICD-10-CM

## 2021-06-12 NOTE — Patient Instructions (Signed)
Blue-Emu cream -- use 2-3 times a day ? ?

## 2021-06-12 NOTE — Assessment & Plan Note (Signed)
CT ca scoring nl "0" score, no CAD 12/19 On diet

## 2021-06-12 NOTE — Progress Notes (Signed)
Subjective:  Patient ID: Jeremy [name], male    DOB: 04/23/1940  Age: 81 y.o. MRN: [id number]  CC: No chief complaint on file.   HPI Jeremy [name] presents for a 6 mo f/u - OA, allergies  Outpatient Medications Prior to Visit  Medication Sig Dispense Refill   azelastine (ASTELIN) 0.1 % nasal spray Place 2 sprays into both nostrils 2 (two) times daily. Use in each nostril as directed 30 mL 11   Cholecalciferol (VITAMIN D3) 50 MCG (2000 UT) capsule Take 1 capsule (2,000 Units total) by mouth daily. 100 capsule 3   mometasone (NASONEX) 50 MCG/ACT nasal spray Place 2 sprays into the nose daily. 1 each 11   montelukast (SINGULAIR) 10 MG tablet Take 1 tablet (10 mg total) by mouth at bedtime. 30 tablet 11   No facility-administered medications prior to visit.    ROS: Review of Systems  Constitutional:  Negative for appetite change, fatigue and unexpected weight change.  HENT:  Positive for rhinorrhea. Negative for congestion, nosebleeds, sneezing, sore throat and trouble swallowing.   Eyes:  Negative for itching and visual disturbance.  Respiratory:  Negative for cough.   Cardiovascular:  Negative for chest pain, palpitations and leg swelling.  Gastrointestinal:  Negative for abdominal distention, blood in stool, diarrhea and nausea.  Genitourinary:  Negative for frequency and hematuria.  Musculoskeletal:  Positive for arthralgias and back pain. Negative for gait problem, joint swelling and neck pain.  Skin:  Negative for rash.  Neurological:  Negative for dizziness, tremors, speech difficulty and weakness.  Psychiatric/Behavioral:  Negative for agitation, dysphoric mood and sleep disturbance. The patient is not nervous/anxious.    Objective:  BP 120/70 (BP Location: Left Arm, Patient Position: Sitting, Cuff Size: Large)   Pulse 65   Temp 97.7 F (36.5 C) (Oral)   Ht 5' 7.5" (1.715 m)   Wt 172 lb (78 kg)   SpO2 99%   BMI 26.54 kg/m   BP Readings from Last 3  Encounters:  06/12/21 120/70  04/11/21 124/60  04/10/21 124/78    Wt Readings from Last 3 Encounters:  06/12/21 172 lb (78 kg)  04/11/21 179 lb (81.2 kg)  04/10/21 178 lb (80.7 kg)    Physical Exam Constitutional:      General: He is not in acute distress.    Appearance: He is well-developed. He is obese.     Comments: NAD  Eyes:     Conjunctiva/sclera: Conjunctivae normal.     Pupils: Pupils are equal, round, and reactive to light.  Neck:     Thyroid: No thyromegaly.     Vascular: No JVD.  Cardiovascular:     Rate and Rhythm: Normal rate and regular rhythm.     Heart sounds: Normal heart sounds. No murmur heard.   No friction rub. No gallop.  Pulmonary:     Effort: Pulmonary effort is normal. No respiratory distress.     Breath sounds: Normal breath sounds. No wheezing or rales.  Chest:     Chest wall: No tenderness.  Abdominal:     General: Bowel sounds are normal. There is no distension.     Palpations: Abdomen is soft. There is no mass.     Tenderness: There is no abdominal tenderness. There is no guarding or rebound.  Musculoskeletal:        General: No tenderness. Normal range of motion.     Cervical back: Normal range of motion.  Lymphadenopathy:     Cervical: No cervical  adenopathy.  Skin:    General: Skin is warm and dry.     Findings: No rash.  Neurological:     Mental Status: He is alert and oriented to person, place, and time.     Cranial Nerves: No cranial nerve deficit.     Motor: No abnormal muscle tone.     Coordination: Coordination normal.     Gait: Gait normal.     Deep Tendon Reflexes: Reflexes are normal and symmetric.  Psychiatric:        Behavior: Behavior normal.        Thought Content: Thought content normal.        Judgment: Judgment normal.    Lab Results  Component Value Date   WBC 4.9 05/30/2020   HGB 15.1 05/30/2020   HCT 43.3 05/30/2020   PLT 186.0 05/30/2020   GLUCOSE 90 12/12/2020   CHOL 140 12/12/2020   TRIG 55.0  12/12/2020   HDL 42.60 12/12/2020   LDLCALC 86 12/12/2020   ALT 22 12/12/2020   AST 21 12/12/2020   NA 140 12/12/2020   K 4.4 12/12/2020   CL 105 12/12/2020   CREATININE 0.86 12/12/2020   BUN 20 12/12/2020   CO2 29 12/12/2020   TSH 1.38 12/12/2020   PSA 0.07 (L) 05/30/2020   INR 1.0 ratio 03/27/2008    No results found.  Assessment & Plan:   Problem List Items Addressed This Visit     Chronic low back pain    Blue-Emu cream was recommended to use 2-3 times a day        Dyslipidemia    CT ca scoring nl "0" score, no CAD 12/19 On diet      Relevant Orders   TSH   Lipid panel   Well adult exam - Primary   Relevant Orders   TSH   Urinalysis   CBC with Differential/Platelet   Lipid panel   Comprehensive metabolic panel      No orders of the defined types were placed in this encounter.     Follow-up: Return in about 6 months (around 12/12/2021) for Wellness Exam.  Walker Kehr, MD

## 2021-06-12 NOTE — Assessment & Plan Note (Signed)
Blue-Emu cream was recommended to use 2-3 times a day ? ?

## 2021-06-16 DIAGNOSIS — C61 Malignant neoplasm of prostate: Secondary | ICD-10-CM | POA: Diagnosis not present

## 2021-07-01 ENCOUNTER — Emergency Department (HOSPITAL_BASED_OUTPATIENT_CLINIC_OR_DEPARTMENT_OTHER): Payer: Medicare HMO

## 2021-07-01 ENCOUNTER — Encounter (HOSPITAL_BASED_OUTPATIENT_CLINIC_OR_DEPARTMENT_OTHER): Payer: Self-pay | Admitting: Urology

## 2021-07-01 ENCOUNTER — Emergency Department (HOSPITAL_BASED_OUTPATIENT_CLINIC_OR_DEPARTMENT_OTHER)
Admission: EM | Admit: 2021-07-01 | Discharge: 2021-07-01 | Disposition: A | Discharge status: Home / Self Care | Payer: Medicare HMO | Attending: Emergency Medicine | Admitting: Emergency Medicine

## 2021-07-01 DIAGNOSIS — L03114 Cellulitis of left upper limb: Secondary | ICD-10-CM | POA: Diagnosis not present

## 2021-07-01 DIAGNOSIS — M79642 Pain in left hand: Secondary | ICD-10-CM | POA: Diagnosis not present

## 2021-07-01 DIAGNOSIS — M7989 Other specified soft tissue disorders: Secondary | ICD-10-CM | POA: Diagnosis not present

## 2021-07-01 MED ORDER — DIPHENHYDRAMINE HCL 25 MG PO CAPS
25.0000 mg | ORAL_CAPSULE | Freq: Once | ORAL | Status: AC
Start: 2021-07-01 — End: 2021-07-01
  Administered 2021-07-01: 25 mg via ORAL
  Filled 2021-07-01: qty 1

## 2021-07-01 MED ORDER — CEFTRIAXONE SODIUM 1 G IJ SOLR
1.0000 g | Freq: Once | INTRAMUSCULAR | Status: AC
  Administered 2021-07-01: 1 g via INTRAMUSCULAR
  Filled 2021-07-01: qty 10

## 2021-07-01 MED ORDER — SODIUM CHLORIDE 0.9 % IV BOLUS: 1000.0000 mL | Freq: Once | INTRAVENOUS | Status: DC

## 2021-07-01 MED ORDER — CEPHALEXIN 500 MG PO CAPS: 500.0000 mg | ORAL_CAPSULE | Freq: Three times a day (TID) | ORAL | 0 refills | Status: AC

## 2021-07-01 MED ORDER — SODIUM CHLORIDE 0.9 % IV SOLN: 2.0000 g | Freq: Once | INTRAVENOUS | Status: DC

## 2021-07-01 MED ORDER — METHYLPREDNISOLONE SODIUM SUCC 125 MG IJ SOLR: 125.0000 mg | Freq: Once | INTRAMUSCULAR | Status: DC

## 2021-07-01 NOTE — ED Provider Notes (Signed)
[facility] EMERGENCY DEPARTMENT Provider Note   CSN: [id number] Arrival date & time: 07/01/21  1049     History  Chief Complaint  Patient presents with   Hand Problem    FRANCISCOJAVIER WRONSKI is a 81 y.o. male.  Patient is an 81 year old male presenting for complaints of swelling to left hand.  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  Stated several hours later he noticed redness and swelling to the dorsum of his left hand.  Patient hypothesizes that he possibly got bit by a bug however states he never felt any pain or saw puncture wounds.  Patient now has redness, swelling, and a tightness sensation of the entire right hand with extension halfway up the right arm.  Denies any fevers or chills.  The history is provided by the patient. No language interpreter was used.       Home Medications Prior to Admission medications   Medication Sig Start Date End Date Taking? Authorizing Provider  cephALEXin (KEFLEX) 500 MG capsule Take 1 capsule (500 mg total) by mouth 3 (three) times daily for 10 days. 07/01/21 4/69/62 Yes Campbell Stall P, DO  azelastine (ASTELIN) 0.1 % nasal spray Place 2 sprays into both nostrils 2 (two) times daily. Use in each nostril as directed 04/11/21   Ambs, Kathrine Cords, FNP  Cholecalciferol (VITAMIN D3) 50 MCG (2000 UT) capsule Take 1 capsule (2,000 Units total) by mouth daily. 11/30/19   Plotnikov, Evie Lacks, MD  mometasone (NASONEX) 50 MCG/ACT nasal spray Place 2 sprays into the nose daily. 04/11/21   Dara Hoyer, FNP  montelukast (SINGULAIR) 10 MG tablet Take 1 tablet (10 mg total) by mouth at bedtime. 12/03/20   Kozlow, Donnamarie Poag, MD      Allergies    Aspirin    Review of Systems   Review of Systems  Constitutional:  Negative for chills and fever.  HENT:  Negative for ear pain and sore throat.   Eyes:  Negative for pain and visual disturbance.  Respiratory:  Negative for cough and shortness of breath.   Cardiovascular:  Negative for  chest pain and palpitations.  Gastrointestinal:  Negative for abdominal pain and vomiting.  Genitourinary:  Negative for dysuria and hematuria.  Musculoskeletal:  Negative for arthralgias and back pain.  Skin:  Positive for rash. Negative for color change.  Neurological:  Negative for seizures and syncope.  All other systems reviewed and are negative.   Physical Exam Updated Vital Signs BP (!) 153/91 (BP Location: Left Arm)   Pulse 81   Temp 98 F (36.7 C) (Oral)   Resp 18   Ht '5\' 8"'$  (1.727 m)   Wt 78 kg   SpO2 99%   BMI 26.15 kg/m  Physical Exam Vitals and nursing note reviewed.  Constitutional:      General: He is not in acute distress.    Appearance: He is well-developed.  HENT:     Head: Normocephalic and atraumatic.  Eyes:     Conjunctiva/sclera: Conjunctivae normal.  Cardiovascular:     Rate and Rhythm: Normal rate and regular rhythm.     Heart sounds: No murmur heard. Pulmonary:     Effort: Pulmonary effort is normal. No respiratory distress.     Breath sounds: Normal breath sounds.  Abdominal:     Palpations: Abdomen is soft.     Tenderness: There is no abdominal tenderness.  Musculoskeletal:        General: No swelling.  Cervical back: Neck supple.  Skin:    General: Skin is warm and dry.     Capillary Refill: Capillary refill takes less than 2 seconds.       Neurological:     Mental Status: He is alert.  Psychiatric:        Mood and Affect: Mood normal.     ED Results / Procedures / Treatments   Labs (all labs ordered are listed, but only abnormal results are displayed) Labs Reviewed - No data to display  EKG None  Radiology US Venous Img Upper Uni Left  Result Date: 07/01/2021 CLINICAL DATA:  Left hand swelling and pain EXAM: LEFT UPPER EXTREMITY VENOUS DOPPLER ULTRASOUND TECHNIQUE: Yakir Wenke-scale sonography with graded compression, as well as color Doppler and duplex ultrasound were performed to evaluate the upper extremity deep venous  system from the level of the subclavian vein and including the jugular, axillary, basilic, radial, ulnar and upper cephalic vein. Spectral Doppler was utilized to evaluate flow at rest and with distal augmentation maneuvers. COMPARISON:  None Available. FINDINGS: Contralateral Subclavian Vein: Respiratory phasicity is normal and symmetric with the symptomatic side. No evidence of thrombus. Normal compressibility. Internal Jugular Vein: No evidence of thrombus. Normal compressibility, respiratory phasicity and response to augmentation. Subclavian Vein: No evidence of thrombus. Normal compressibility, respiratory phasicity and response to augmentation. Axillary Vein: No evidence of thrombus. Normal compressibility, respiratory phasicity and response to augmentation. Cephalic Vein: No evidence of thrombus. Normal compressibility, respiratory phasicity and response to augmentation. Basilic Vein: No evidence of thrombus. Normal compressibility, respiratory phasicity and response to augmentation. Brachial Veins: No evidence of thrombus. Normal compressibility, respiratory phasicity and response to augmentation. Radial Veins: No evidence of thrombus. Normal compressibility, respiratory phasicity and response to augmentation. Ulnar Veins: No evidence of thrombus. Normal compressibility, respiratory phasicity and response to augmentation. Venous Reflux:  None visualized. Other Findings:  None visualized. IMPRESSION: Negative examination for deep venous thrombosis in the left upper extremity. Electronically Signed   By: Delanna Ahmadi M.D.   On: 07/01/2021 13:27    Procedures Procedures    Medications Ordered in ED Medications  diphenhydrAMINE (BENADRYL) capsule 25 mg (25 mg Oral Given 07/01/21 1237)  cefTRIAXone (ROCEPHIN) injection 1 g (1 g Intramuscular Given 07/01/21 1238)    ED Course/ Medical Decision Making/ A&P                           Medical Decision Making Risk Prescription drug management.   1:54  PM Patient is an 81 year old male presenting for complaints of swelling to left hand.  Physical exam concerning for cellulitis.  No signs of tenosynovitis.  Compartments are soft.  Rocephin given in ED.  Ultrasound demonstrates no DVT.  Patient sent home with prescription for Keflex for cellulitis.  Recommended for close follow-up with PCP in the next 3 to 5 days.  Recommend prompt return to ED for any worsening of symptoms.  Patient in no distress and overall condition improved here in the ED. Detailed discussions were had with the patient regarding current findings, and need for close f/u with PCP or on call doctor. The patient has been instructed to return immediately if the symptoms worsen in any way for re-evaluation. Patient verbalized understanding and is in agreement with current care plan. All questions answered prior to discharge.        Final Clinical Impression(s) / ED Diagnoses Final diagnoses:  Cellulitis of left upper extremity    Rx /  DC Orders ED Discharge Orders          Ordered    cephALEXin (KEFLEX) 500 MG capsule  3 times daily        07/01/21 1352              Lianne Cure, DO 85/90/93 1354

## 2021-07-01 NOTE — Discharge Instructions (Signed)
Ultrasound demonstrates no blood clot in the upper extremity.  Concerns for cellulitis of the left upper extremity.  Antibiotics Keflex prescribed to your pharmacy.  Please take until complete prescription is gone.  Follow-up with your primary care physician in the next 3 to 5 days if symptoms do not start to resolve.  Return to emergency department for any pain with movement of your fingers.

## 2021-07-01 NOTE — ED Triage Notes (Signed)
Pt states possible insect bite to left hand.  Swelling and redness noted to left hand State itching

## 2021-07-01 NOTE — ED Notes (Signed)
D/c paperwork reviewed with pt, including prescription and f/u care. All questions addressed prior to d/c. Pt ambulatory to ED exit without assistance, NAD.

## 2021-07-22 DIAGNOSIS — H04221 Epiphora due to insufficient drainage, right lacrimal gland: Secondary | ICD-10-CM | POA: Diagnosis not present

## 2021-08-21 DIAGNOSIS — L570 Actinic keratosis: Secondary | ICD-10-CM | POA: Diagnosis not present

## 2021-08-21 DIAGNOSIS — L821 Other seborrheic keratosis: Secondary | ICD-10-CM | POA: Diagnosis not present

## 2021-08-21 DIAGNOSIS — D485 Neoplasm of uncertain behavior of skin: Secondary | ICD-10-CM | POA: Diagnosis not present

## 2021-08-21 DIAGNOSIS — L814 Other melanin hyperpigmentation: Secondary | ICD-10-CM | POA: Diagnosis not present

## 2021-08-21 DIAGNOSIS — C44311 Basal cell carcinoma of skin of nose: Secondary | ICD-10-CM | POA: Diagnosis not present

## 2021-08-21 DIAGNOSIS — D225 Melanocytic nevi of trunk: Secondary | ICD-10-CM | POA: Diagnosis not present

## 2021-08-24 ENCOUNTER — Other Ambulatory Visit: Payer: Self-pay | Admitting: Internal Medicine

## 2021-08-24 NOTE — Telephone Encounter (Signed)
Please refill as per office routine med refill policy (all routine meds to be refilled for 3 mo or monthly (per pt preference) up to one year from last visit, then month to month grace period for 3 mo, then further med refills will have to be denied) ? ?

## 2021-10-16 DIAGNOSIS — H02532 Eyelid retraction right lower eyelid: Secondary | ICD-10-CM | POA: Diagnosis not present

## 2021-10-16 DIAGNOSIS — H04521 Eversion of right lacrimal punctum: Secondary | ICD-10-CM | POA: Diagnosis not present

## 2021-10-16 DIAGNOSIS — H02135 Senile ectropion of left lower eyelid: Secondary | ICD-10-CM | POA: Diagnosis not present

## 2021-10-16 DIAGNOSIS — H02132 Senile ectropion of right lower eyelid: Secondary | ICD-10-CM | POA: Diagnosis not present

## 2021-10-16 DIAGNOSIS — H04221 Epiphora due to insufficient drainage, right lacrimal gland: Secondary | ICD-10-CM | POA: Diagnosis not present

## 2021-10-16 DIAGNOSIS — H0270 Unspecified degenerative disorders of eyelid and periocular area: Secondary | ICD-10-CM | POA: Diagnosis not present

## 2021-10-16 DIAGNOSIS — H04561 Stenosis of right lacrimal punctum: Secondary | ICD-10-CM | POA: Diagnosis not present

## 2021-10-16 DIAGNOSIS — H04123 Dry eye syndrome of bilateral lacrimal glands: Secondary | ICD-10-CM | POA: Diagnosis not present

## 2021-10-16 DIAGNOSIS — H16211 Exposure keratoconjunctivitis, right eye: Secondary | ICD-10-CM | POA: Diagnosis not present

## 2021-10-23 NOTE — Patient Instructions (Addendum)
1. Every night use the following during periods of upper airway symptoms:   A. Begin Ryaltris 2 sprays in each nostril twice a day as needed for nasal symptoms. Call the clinic if you do not like this medication and we will break it down to it's separate components  B. Montelukast 10 mg tablet daily  C. Nasal saline rinse  2. If needed:   A. over-the-counter antihistamine  B. Over the counter nasal saline  3. Return to clinic in 1 year or earlier if problem  Reducing Pollen Exposure The American Academy of All[employer], [employer] and [employer] suggests the following steps to reduce your exposure to pollen during allergy seasons. Do not hang sheets or clothing out to dry; pollen may collect on these items. Do not mow lawns or spend time around freshly cut grass; mowing stirs up pollen. Keep windows closed at night.  Keep car windows closed while driving. Minimize morning activities outdoors, a time when pollen counts are usually at their highest. Stay indoors as much as possible when pollen counts or humidity is high and on windy days when pollen tends to remain in the air longer. Use air conditioning when possible.  Many air conditioners have filters that trap the pollen spores. Use a HEPA room air filter to remove pollen form the indoor air you breathe.  Control of Mold Allergen Mold and fungi can grow on a variety of surfaces provided certain temperature and moisture conditions exist.  Outdoor molds grow on plants, decaying vegetation and soil.  The major outdoor mold, Alternaria and Cladosporium, are found in very high numbers during hot and dry conditions.  Generally, a late Summer - Fall peak is seen for common outdoor fungal spores.  Rain will temporarily lower outdoor mold spore count, but counts rise rapidly when the rainy period ends.  The most important indoor molds are Aspergillus and Penicillium.  Dark, humid and poorly ventilated basements are ideal sites for mold growth.  The next  most common sites of mold growth are the bathroom and the kitchen.  Outdoor [employer] Use air conditioning and keep windows closed Avoid exposure to decaying vegetation. Avoid leaf raking. Avoid grain handling. Consider wearing a face mask if working in moldy areas.  Indoor Mold Control Maintain humidity below 50%. Clean washable surfaces with 5% bleach solution. Remove sources e.g. Contaminated carpets.   Control of Dust Mite Allergen Dust mites play a major role in allergic asthma and rhinitis. They occur in environments with high humidity wherever human skin is found. Dust mites absorb humidity from the atmosphere (ie, they do not drink) and feed on organic matter (including shed human and animal skin). Dust mites are a microscopic type of insect that you cannot see with the naked eye. High levels of dust mites have been detected from mattresses, pillows, carpets, upholstered furniture, bed covers, clothes, soft toys and any woven material. The principal allergen of the dust mite is found in its feces. A gram of dust may contain 1,000 mites and 250,000 fecal particles. Mite antigen is easily measured in the air during house cleaning activities. Dust mites do not bite and do not cause harm to humans, other than by triggering allergies/asthma.  Ways to decrease your exposure to dust mites in your home:  1. Encase mattresses, box springs and pillows with a mite-impermeable barrier or cover  2. Wash sheets, blankets and drapes weekly in hot water (130 F) with detergent and dry them in a dryer on the hot setting.  3. Have the room cleaned frequently with a vacuum cleaner and a damp dust-mop. For carpeting or rugs, vacuuming with a vacuum cleaner equipped with a high-efficiency particulate air (HEPA) filter. The dust mite allergic individual should not be in a room which is being cleaned and should wait 1 hour after cleaning before going into the room.  4. Do not sleep on upholstered  furniture (eg, couches).  5. If possible removing carpeting, upholstered furniture and drapery from the home is ideal. Horizontal blinds should be eliminated in the rooms where the person spends the most time (bedroom, study, television room). Washable vinyl, roller-type shades are optimal.  6. Remove all non-washable stuffed toys from the bedroom. Wash stuffed toys weekly like sheets and blankets above.  7. Reduce indoor humidity to less than 50%. Inexpensive humidity monitors can be purchased at most hardware stores. Do not use a humidifier as can make the problem worse and are not recommended.

## 2021-10-23 NOTE — Progress Notes (Signed)
[place] [id number] Dept: [phone]  FOLLOW UP NOTE  Patient ID: Jeremy [name], male    DOB: 05/11/1940  Age: 81 y.o. MRN: [id number] Date of Office Visit: 10/24/2021  Assessment  Chief Complaint: Nasal Congestion  HPI Jeremy [name] is an 81 year old male who presents to the clinic for follow-up visit with allergic rhinitis flare.  He was last seen in this clinic on 04/11/2021 by Gareth Morgan, FNP, for evaluation of allergic rhinitis with acute flare requiring prednisone for 3 days.  At today's visit, he reports his allergic rhinitis has been poorly controlled over the last 2 to 3 weeks with symptoms including intermittent rhinorrhea, nasal congestion occurring mainly at nightly, and sneezing.  He reports these symptoms seem to be seasonal occurring in the spring and fall.  He has been using Afrin over the last 2 weeks with relief of symptoms.  He does report relief with Flonase and azelastine previously, however, he has run out of these medications at this time. His last environmental allergy testing was in 2016 and was positive to dust mites, mold, and tree pollens.  His current medications are listed in the chart.   Drug Allergies:  Allergies  Allergen Reactions   Aspirin     REACTION: gi    Physical Exam: BP 138/70 (BP Location: Left Arm, Patient Position: Sitting, Cuff Size: Normal)   Pulse 82   Temp 98.7 F (37.1 C) (Temporal)   Resp 18   Ht 5' 7.5" (1.715 m)   Wt 173 lb 9.6 oz (78.7 kg)   SpO2 97%   BMI 26.79 kg/m    Physical Exam Vitals reviewed.  Constitutional:      Appearance: Normal appearance.  HENT:     Head: Normocephalic and atraumatic.     Right Ear: Tympanic membrane normal.     Left Ear: Tympanic membrane normal.     Nose:     Comments: Bilateral nares edematous and pale with clear nasal drainage noted.  Pharynx normal.  Ears normal.  Eyes normal.    Mouth/Throat:     Pharynx: Oropharynx is clear.  Eyes:      Conjunctiva/sclera: Conjunctivae normal.  Cardiovascular:     Rate and Rhythm: Normal rate and regular rhythm.     Heart sounds: Normal heart sounds. No murmur heard. Pulmonary:     Effort: Pulmonary effort is normal.     Breath sounds: Normal breath sounds.     Comments: Lungs clear to auscultation Musculoskeletal:        General: Normal range of motion.     Cervical back: Normal range of motion and neck supple.  Skin:    General: Skin is warm and dry.  Neurological:     Mental Status: He is alert and oriented to person, place, and time.  Psychiatric:        Mood and Affect: Mood normal.        Behavior: Behavior normal.        Thought Content: Thought content normal.        Judgment: Judgment normal.      Assessment and Plan: 1. Seasonal allergic rhinitis due to pollen     Meds ordered this encounter  Medications   RYALTRIS 665-25 MCG/ACT SUSP    Sig: Place 2 sprays into the nose 2 (two) times daily as needed.    Dispense:  29 g    Refill:  5   montelukast (SINGULAIR) 10 MG tablet  Sig: Take 1 tablet (10 mg total) by mouth daily.    Dispense:  30 tablet    Refill:  5    Patient Instructions   1. Every night use the following during periods of upper airway symptoms:   A. Begin Ryaltris 2 sprays in each nostril twice a day as needed for nasal symptoms. Call the clinic if you do not like this medication and we will break it down to it's separate components  B. Montelukast 10 mg tablet daily  C. Nasal saline rinse  2. If needed:   A. over-the-counter antihistamine  B. Over the counter nasal saline  3. Return to clinic in 1 year or earlier if problem   Return in about 1 year (around 10/25/2022), or if symptoms worsen or fail to improve.    Thank you for the opportunity to care for this patient.  Please do not hesitate to contact me with questions.  Gareth Morgan, FNP [facility]

## 2021-10-24 ENCOUNTER — Ambulatory Visit (INDEPENDENT_AMBULATORY_CARE_PROVIDER_SITE_OTHER): Payer: Medicare HMO | Admitting: *Deleted

## 2021-10-24 ENCOUNTER — Encounter: Payer: Self-pay | Admitting: Family Medicine

## 2021-10-24 ENCOUNTER — Ambulatory Visit (INDEPENDENT_AMBULATORY_CARE_PROVIDER_SITE_OTHER): Payer: Medicare HMO | Admitting: Family Medicine

## 2021-10-24 VITALS — BP 138/70 | HR 82 | Temp 98.7°F | Resp 18 | Ht 67.5 in | Wt 173.6 lb

## 2021-10-24 DIAGNOSIS — J301 Allergic rhinitis due to pollen: Secondary | ICD-10-CM

## 2021-10-24 DIAGNOSIS — Z23 Encounter for immunization: Secondary | ICD-10-CM | POA: Diagnosis not present

## 2021-10-24 MED ORDER — RYALTRIS 665-25 MCG/ACT NA SUSP: 2.0000 | Freq: Two times a day (BID) | NASAL | 5 refills | Status: DC | PRN

## 2021-10-24 MED ORDER — MONTELUKAST SODIUM 10 MG PO TABS: 10.0000 mg | ORAL_TABLET | Freq: Every day | ORAL | 5 refills | Status: DC

## 2021-10-24 NOTE — Progress Notes (Signed)
Administered high dose flu shot right deltoid. Pt tolerated well. 

## 2021-10-30 DIAGNOSIS — C44319 Basal cell carcinoma of skin of other parts of face: Secondary | ICD-10-CM | POA: Diagnosis not present

## 2021-11-20 ENCOUNTER — Ambulatory Visit (INDEPENDENT_AMBULATORY_CARE_PROVIDER_SITE_OTHER): Payer: Medicare HMO | Admitting: Nurse Practitioner

## 2021-11-20 VITALS — BP 118/72 | HR 76 | Temp 98.1°F | Ht 67.5 in | Wt 173.5 lb

## 2021-11-20 DIAGNOSIS — T148XXA Other injury of unspecified body region, initial encounter: Secondary | ICD-10-CM | POA: Diagnosis not present

## 2021-11-20 MED ORDER — METHOCARBAMOL 500 MG PO TABS: 500.0000 mg | ORAL_TABLET | Freq: Two times a day (BID) | ORAL | 0 refills | Status: DC | PRN

## 2021-11-20 NOTE — Assessment & Plan Note (Addendum)
Acute, no red flags noted on exam.  Will treat symptomatically with Tylenol 6 or 50 mg by mouth scheduled every 8 hours x3 days, then take 650 mg by mouth every 8 hours as needed.  Robaxin 500 mg every 12 hours as needed (educated to avoid driving or operating heavy machinery while taking).  Use of heating pad 20 minutes on and 20 minutes off as needed.  Use of topical pain patch, patient educated not to have pain patch in place when using heating pad.  Patient ALSO educated to not take more than 3000 mg of Tylenol in a 24-hour period.  Patient educated on signs and symptoms of cauda equina syndrome and to call 911 if these occur.  Patient reports understanding.  Patient encouraged to notify our office if symptoms persist the next week at which point may consider imaging of the spine.

## 2021-11-20 NOTE — Patient Instructions (Signed)
Take tylenol 325-'650mg'$  by mouth every 8 hours for pain. Take this scheduled for 3 days then only as needed. Do not exceed '3000mg'$  by mouth in 24 hours. Take robaxin (methocarbamol) '500mg'$  tablet - take 1 tablet by mouth every 12 hours as needed for pain/muscle spasm. Do not drive/operate heavy machinery while taking this.  Use heating pad as needed for pain. Use for 20 minutes and then take a break for at least 20 minutes. Protect skin by using a barrier (such as towel or shirt) between heating pad and skin.  You can use topical pain patch as well but take this off before using heating pad.  If you experience sensory changes or weakness in legs or start to experience new/sudden bowel/bladder incontinence call 911.   If symptoms persist into next week call our office and we will consider further evaluation.

## 2021-11-20 NOTE — Progress Notes (Signed)
Established Patient Office Visit  Subjective   Patient ID: Jeremy [name], male    DOB: [birth year]  Age: 81 y.o. MRN: [id number]  Chief Complaint  Patient presents with   Back Pain    Symptom onset approximately 9 days ago.  Patient's wife had a fall at home and he helped pick her up, since then has been having intermittent low back pain.  Is reproducible with twisting motion.  Denies weakness or sensory changes in lower extremities denies new onset of incontinence.  Has been taking Tylenol 2 tablets by mouth at bedtime at home.  Has been unable to participate in golf for exercise due to pain.  Has been using heating pad and topical pain patches as needed (reports not using at same time).    Review of Systems  Constitutional:  Negative for fever.  Genitourinary:  Negative for hematuria.  Musculoskeletal:  Positive for back pain.      Objective:     BP 118/72   Pulse 76   Temp 98.1 F (36.7 C) (Oral)   Ht 5' 7.5" (1.715 m)   Wt 173 lb 8 oz (78.7 kg)   SpO2 99%   BMI 26.77 kg/m    Physical Exam Vitals reviewed.  Constitutional:      Appearance: Normal appearance.  HENT:     Head: Normocephalic and atraumatic.  Cardiovascular:     Rate and Rhythm: Normal rate and regular rhythm.  Pulmonary:     Effort: Pulmonary effort is normal.     Breath sounds: Normal breath sounds.  Musculoskeletal:     Cervical back: Normal and neck supple.     Thoracic back: Normal.     Lumbar back: No tenderness or bony tenderness. Decreased range of motion (due to pain). Negative right straight leg raise test and negative left straight leg raise test.  Skin:    General: Skin is warm and dry.  Neurological:     Mental Status: He is alert and oriented to person, place, and time.  Psychiatric:        Mood and Affect: Mood normal.        Behavior: Behavior normal.        Thought Content: Thought content normal.        Judgment: Judgment normal.      No results found for any  visits on 11/20/21.    The ASCVD Risk score (Arnett DK, et al., 2019) failed to calculate for the following reasons:   The 2019 ASCVD risk score is only valid for ages 38 to 19    Assessment & Plan:   Problem List Items Addressed This Visit       Musculoskeletal and Integument   Muscle strain - Primary    Acute, no red flags noted on exam.  Will treat symptomatically with Tylenol 6 or 50 mg by mouth scheduled every 8 hours x3 days, then take 650 mg by mouth every 8 hours as needed.  Robaxin 500 mg every 12 hours as needed (educated to avoid driving or operating heavy machinery while taking).  Use of heating pad 20 minutes on and 20 minutes off as needed.  Use of topical pain patch, patient educated not to have pain patch in place when using heating pad.  Patient ALSO educated to not take more than 3000 mg of Tylenol in a 24-hour period.  Patient educated on signs and symptoms of cauda equina syndrome and to call 911 if these occur.  Patient reports understanding.  Patient encouraged to notify our office if symptoms persist the next week at which point may consider imaging of the spine.       Relevant Medications   methocarbamol (ROBAXIN) 500 MG tablet    Return if symptoms worsen or fail to improve.    Ailene Ards, NP

## 2021-11-25 ENCOUNTER — Ambulatory Visit (INDEPENDENT_AMBULATORY_CARE_PROVIDER_SITE_OTHER): Payer: Medicare HMO | Admitting: Emergency Medicine

## 2021-11-25 ENCOUNTER — Ambulatory Visit (INDEPENDENT_AMBULATORY_CARE_PROVIDER_SITE_OTHER): Payer: Medicare HMO

## 2021-11-25 ENCOUNTER — Encounter: Payer: Self-pay | Admitting: Emergency Medicine

## 2021-11-25 VITALS — BP 132/72 | HR 73 | Temp 97.6°F | Ht 67.5 in | Wt 177.1 lb

## 2021-11-25 DIAGNOSIS — S39012A Strain of muscle, fascia and tendon of lower back, initial encounter: Secondary | ICD-10-CM

## 2021-11-25 DIAGNOSIS — M549 Dorsalgia, unspecified: Secondary | ICD-10-CM

## 2021-11-25 DIAGNOSIS — M545 Low back pain, unspecified: Secondary | ICD-10-CM | POA: Diagnosis not present

## 2021-11-25 DIAGNOSIS — M47816 Spondylosis without myelopathy or radiculopathy, lumbar region: Secondary | ICD-10-CM | POA: Diagnosis not present

## 2021-11-25 MED ORDER — TRAMADOL HCL 50 MG PO TABS: 50.0000 mg | ORAL_TABLET | Freq: Three times a day (TID) | ORAL | 0 refills | Status: AC | PRN

## 2021-11-25 NOTE — Assessment & Plan Note (Signed)
Sustained 2 weeks ago.  Still having some pain Mechanical in nature No red flag signs or symptoms. Continue muscle relaxants Robaxin 500 mg twice a day and Tylenol as needed for pain X-rays done today

## 2021-11-25 NOTE — Assessment & Plan Note (Signed)
Secondary to lumbar strain.  Pain management discussed. May continue Tylenol for mild to moderate pain and start tramadol for moderate to severe pain.

## 2021-11-25 NOTE — Patient Instructions (Signed)
Acute Back Pain, Adult Acute back pain is sudden and usually short-lived. It is often caused by an injury to the muscles and tissues in the back. The injury may result from: A muscle, tendon, or ligament getting overstretched or torn. Ligaments are tissues that connect bones to each other. Lifting something improperly can cause a back strain. Wear and tear (degeneration) of the spinal disks. Spinal disks are circular tissue that provide cushioning between the bones of the spine (vertebrae). Twisting motions, such as while playing sports or doing yard work. A hit to the back. Arthritis. You may have a physical exam, lab tests, and imaging tests to find the cause of your pain. Acute back pain usually goes away with rest and home care. Follow these instructions at home: Managing pain, stiffness, and swelling Take over-the-counter and prescription medicines only as told by your health care provider. Treatment may include medicines for pain and inflammation that are taken by mouth or applied to the skin, or muscle relaxants. Your health care provider may recommend applying ice during the first 24-48 hours after your pain starts. To do this: Put ice in a plastic bag. Place a towel between your skin and the bag. Leave the ice on for 20 minutes, 2-3 times a day. Remove the ice if your skin turns bright red. This is very important. If you cannot feel pain, heat, or cold, you have a greater risk of damage to the area. If directed, apply heat to the affected area as often as told by your health care provider. Use the heat source that your health care provider recommends, such as a moist heat pack or a heating pad. Place a towel between your skin and the heat source. Leave the heat on for 20-30 minutes. Remove the heat if your skin turns bright red. This is especially important if you are unable to feel pain, heat, or cold. You have a greater risk of getting burned. Activity  Do not stay in bed. Staying in  bed for more than 1-2 days can delay your recovery. Sit up and stand up straight. Avoid leaning forward when you sit or hunching over when you stand. If you work at a desk, sit close to it so you do not need to lean over. Keep your chin tucked in. Keep your neck drawn back, and keep your elbows bent at a 90-degree angle (right angle). Sit high and close to the steering wheel when you drive. Add lower back (lumbar) support to your car seat, if needed. Take short walks on even surfaces as soon as you are able. Try to increase the length of time you walk each day. Do not sit, drive, or stand in one place for more than 30 minutes at a time. Sitting or standing for long periods of time can put stress on your back. Do not drive or use heavy machinery while taking prescription pain medicine. Use proper lifting techniques. When you bend and lift, use positions that put less stress on your back: Bend your knees. Keep the load close to your body. Avoid twisting. Exercise regularly as told by your health care provider. Exercising helps your back heal faster and helps prevent back injuries by keeping muscles strong and flexible. Work with a physical therapist to make a safe exercise program, as recommended by your health care provider. Do any exercises as told by your physical therapist. Lifestyle Maintain a healthy weight. Extra weight puts stress on your back and makes it difficult to have good   posture. Avoid activities or situations that make you feel anxious or stressed. Stress and anxiety increase muscle tension and can make back pain worse. Learn ways to manage anxiety and stress, such as through exercise. General instructions Sleep on a firm mattress in a comfortable position. Try lying on your side with your knees slightly bent. If you lie on your back, put a pillow under your knees. Keep your head and neck in a straight line with your spine (neutral position) when using electronic equipment like  smartphones or pads. To do this: Raise your smartphone or pad to look at it instead of bending your head or neck to look down. Put the smartphone or pad at the level of your face while looking at the screen. Follow your treatment plan as told by your health care provider. This may include: Cognitive or behavioral therapy. Acupuncture or massage therapy. Meditation or yoga. Contact a health care provider if: You have pain that is not relieved with rest or medicine. You have increasing pain going down into your legs or buttocks. Your pain does not improve after 2 weeks. You have pain at night. You lose weight without trying. You have a fever or chills. You develop nausea or vomiting. You develop abdominal pain. Get help right away if: You develop new bowel or bladder control problems. You have unusual weakness or numbness in your arms or legs. You feel faint. These symptoms may represent a serious problem that is an emergency. Do not wait to see if the symptoms will go away. Get medical help right away. Call your local emergency services (911 in the [place] Do not drive yourself to the hospital. Summary Acute back pain is sudden and usually short-lived. Use proper lifting techniques. When you bend and lift, use positions that put less stress on your back. Take over-the-counter and prescription medicines only as told by your health care provider, and apply heat or ice as told. This information is not intended to replace advice given to you by your health care provider. Make sure you discuss any questions you have with your health care provider. Document Revised: 03/22/2020 Document Reviewed: 03/22/2020 Elsevier Patient Education  2023 Elsevier Inc.  

## 2021-11-25 NOTE — Progress Notes (Signed)
Jeremy [name] 81 y.o.   Chief Complaint  Patient presents with   Acute Visit    Back pain, patient seen NP last week, given muscle relaxer not working     HISTORY OF PRESENT ILLNESS: Acute problem visit today.  Patient of Dr. Cristie Hem [name]. This is a 81 y.o. male complaining of persistent pain to lumbar area that started 2 weeks ago when she helped his wife get off the floor after she fell. Seen here on 11/20/2021 and started on muscle relaxers.  Has been taking Tylenol for pain management but not helping much. Pain is localized, sharp, without associated symptoms.  No radiation.  Denies bowel or bladder symptoms.  Denies neurological symptoms to his legs. Able to ambulate but movement makes pain worse. No other complaints or medical concerns today.  HPI   Prior to Admission medications   Medication Sig Start Date End Date Taking? Authorizing Provider  Cholecalciferol (VITAMIN D3) 50 MCG (2000 UT) capsule Take 1 capsule (2,000 Units total) by mouth daily. 11/30/19  Yes [name], Jeremy Lacks, MD  methocarbamol (ROBAXIN) 500 MG tablet Take 1 tablet (500 mg total) by mouth 2 (two) times daily as needed for muscle spasms. 11/20/21  Yes Jeremy Ards, NP  mometasone (NASONEX) 50 MCG/ACT nasal spray Place 2 sprays into the nose daily. 04/11/21  Yes [name], Jeremy Cords, FNP  montelukast (SINGULAIR) 10 MG tablet Take 1 tablet (10 mg total) by mouth daily. 10/24/21  Yes [name], Jeremy Cords, FNP  RYALTRIS [phone] MCG/ACT SUSP Place 2 sprays into the nose 2 (two) times daily as needed. 10/24/21  Yes [name], Jeremy Cords, FNP  traMADol (ULTRAM) 50 MG tablet Take 1 tablet (50 mg total) by mouth every 8 (eight) hours as needed for up to 5 days. 11/25/21 11/30/21 Yes Jeremy Record, Ines Bloomer, MD  azelastine (ASTELIN) 0.1 % nasal spray Place 2 sprays into both nostrils 2 (two) times daily. Use in each nostril as directed Patient not taking: Reported on 11/25/2021 04/11/21   Jeremy Hoyer, FNP    Allergies  Allergen  Reactions   Aspirin     REACTION: gi    Patient Active Problem List   Diagnosis Date Noted   Muscle strain 11/20/2021   2nd degree burn multiple sites shoulder and arm except wrist and hand 05/29/2019   Excessive cerumen in ear canal, left 03/30/2019   Bilateral impacted cerumen 03/23/2019   Leg laceration, left, subsequent encounter 10/05/2018   Upper respiratory infection 12/21/2017   Greater trochanteric bursitis, left 08/16/2017   Patellofemoral arthritis of right knee 07/30/2017   Chronic low back pain 11/12/2016   Otitis, externa, infective 09/24/2015   Cerumen impaction 10/22/2014   Greater trochanteric bursitis of right hip 10/01/2014   Seasonal allergic rhinitis due to pollen 09/21/2014   Elevated BP without diagnosis of hypertension 02/19/2014   Plantar fasciitis 09/07/2013   Primary localized osteoarthrosis, lower leg 03/15/2013   Left knee pain 02/03/2013   Osteoarthritis of left knee 02/03/2013   Rash and nonspecific skin eruption 06/27/2012   Dyslipidemia 10/08/2011   Well adult exam 09/22/2010   Hip pain, left 09/22/2010   RHINITIS 01/20/2010   Acute sinusitis 01/08/2010   CAROTID BRUIT 08/22/2009   LYMPHADENITIS 03/20/2009   SKIN CANCER, HX OF 05/04/2008   HEMATURIA 03/27/2008   VOMITING 10/31/2007   ABDOMINAL PAIN, RIGHT LOWER QUADRANT 10/31/2007   COLONIC POLYPS, HX OF 05/02/2007   Allergic rhinitis 11/18/2006   OSTEOPENIA 11/18/2006   PROSTATE CANCER, HX OF 10/14/2006  Past Medical History:  Diagnosis Date   AK (actinic keratosis)    Allergic rhinitis    History of colonic polyps    Dr. Ardis [name]- Due 2018   History of skin cancer    Osteopenia    Prostate cancer (Jeremy [name])    hx of s/p XRT, seeds Dr. Risa [name]    Past Surgical History:  Procedure Laterality Date   INSERTION PROSTATE RADIATION SEED     KNEE ARTHROSCOPY Right    KNEE SURGERY Left    TONSILLECTOMY     XRT  2008    Social History   Socioeconomic History   Marital status:  Married    Spouse name: Not on file   Number of children: 2   Years of education: Not on file   Highest education level: Not on file  Occupational History   Occupation: retired from [employer]  Tobacco Use   Smoking status: Some Days    Types: Cigars   Smokeless tobacco: Never   Tobacco comments:    2 cigars a month  Vaping Use   Vaping Use: Never used  Substance and Sexual Activity   Alcohol use: Yes    Alcohol/week: 2.0 standard drinks of alcohol    Types: 2 Cans of beer per week   Drug use: No   Sexual activity: Yes  Other Topics Concern   Not on file  Social History Narrative   Not on file   Social Determinants of Health   Financial Resource Strain: Low Risk  (02/21/2021)   Overall Financial Resource Strain (CARDIA)    Difficulty of Paying Living Expenses: Not hard at all  Food Insecurity: No Food Insecurity (02/21/2021)   Hunger Vital Sign    Worried About Running Out of Food in the Last Year: Never true    Ran Out of Food in the Last Year: Never true  Transportation Needs: No Transportation Needs (02/21/2021)   PRAPARE - Hydrologist (Medical): No    Lack of Transportation (Non-Medical): No  Physical Activity: Sufficiently Active (02/21/2021)   Exercise Vital Sign    Days of Exercise per Week: 4 days    Minutes of Exercise per Session: 120 min  Stress: No Stress Concern Present (02/21/2021)   [place]    Feeling of Stress : Not at all  Social Connections: Moderately Integrated (02/21/2021)   Social Connection and Isolation Panel [NHANES]    Frequency of Communication with Friends and Family: More than three times a week    Frequency of Social Gatherings with Friends and Family: More than three times a week    Attends Religious Services: More than 4 times per year    Active Member of [employer] or Organizations: No    Attends Archivist Meetings: Never    Marital Status:  Married  Human resources officer Violence: Not At Risk (02/21/2021)   Humiliation, Afraid, Rape, and Kick questionnaire    Fear of Current or Ex-Partner: No    Emotionally Abused: No    Physically Abused: No    Sexually Abused: No    Family History  Problem Relation Age of Onset   Liver disease Father    Cancer Father        pancreas ?   Cancer - Other Other        intestinal cancer     Review of Systems  Constitutional: Negative.  Negative for chills and fever.  HENT: Negative.  Negative for congestion and sore throat.   Respiratory: Negative.  Negative for cough and shortness of breath.   Cardiovascular: Negative.  Negative for chest pain and palpitations.  Gastrointestinal:  Negative for abdominal pain, diarrhea, nausea and vomiting.  Genitourinary: Negative.  Negative for dysuria and hematuria.  Skin: Negative.  Negative for rash.  Neurological: Negative.  Negative for dizziness and headaches.  All other systems reviewed and are negative.  Today's Vitals   11/25/21 1103  BP: 132/72  Pulse: 73  Temp: 97.6 F (36.4 C)  TempSrc: Oral  SpO2: 96%  Weight: 177 lb 2 oz (80.3 kg)  Height: 5' 7.5" (1.715 m)   Body mass index is 27.33 kg/m.   Physical Exam Vitals reviewed.  Constitutional:      Appearance: Normal appearance.  HENT:     Head: Normocephalic.  Eyes:     Extraocular Movements: Extraocular movements intact.  Cardiovascular:     Rate and Rhythm: Normal rate.  Pulmonary:     Effort: Pulmonary effort is normal.  Abdominal:     Palpations: Abdomen is soft.     Tenderness: There is no abdominal tenderness.  Musculoskeletal:     Lumbar back: Spasms and tenderness present. No bony tenderness. Decreased range of motion.  Skin:    General: Skin is warm and dry.  Neurological:     Mental Status: He is alert and oriented to person, place, and time.     Sensory: No sensory deficit.     Motor: No weakness.     Deep Tendon Reflexes: Reflexes normal.  Psychiatric:         Mood and Affect: Mood normal.        Behavior: Behavior normal.    DG Lumbar Spine 2-3 Views  Result Date: 11/25/2021 CLINICAL DATA:  Back pain EXAM: LUMBAR SPINE - 2-3 VIEW COMPARISON:  CT abdomen and pelvis done on 11/11/2020 FINDINGS: There is mild decrease in height of upper endplate of body of L2 vertebra with 10% decrease in height. This finding is new in comparison with the CT done on 11/11/2020. Coarse trabeculae in the body of L3 vertebra suggests hemangioma. Alignment of posterior margins of vertebral bodies appears normal. Degenerative changes are noted with anterior bony spurs and facet hypertrophy, more so at L5-S1 level. There is disc space narrowing at the L5-S1 level. There is 1.4 cm calcific density in the right kidney. There is possible small 3 mm calculus in the right kidney. IMPRESSION: There is mild 10% decrease in height of upper endplate of body of L2 vertebra suggesting recent or old mild compression. This finding was not seen in CT done on 11/11/2020. Lumbar spondylosis with bony spurs, facet hypertrophy and disc space narrowing at multiple levels. 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 Right renal stones. Electronically Signed   By: Elmer Picker M.D.   On: 11/25/2021 11:40     ASSESSMENT & PLAN: A total of 45 minutes was spent with the patient and counseling/coordination of care regarding preparing for this visit, review of most recent office visit notes and available medical records, review of today's x-ray report and need for orthopedic evaluation, pain management, review of chronic medical problems and their management, review of all medications, prognosis, documentation and need for follow-up.  Problem List Items Addressed This Visit       Musculoskeletal and Integument   Acute lumbar myofascial strain - Primary    Sustained 2 weeks ago.  Still having some pain Mechanical in  nature No red flag signs or symptoms. Continue muscle relaxants  Robaxin 500 mg twice a day and Tylenol as needed for pain X-rays done today      Relevant Medications   traMADol (ULTRAM) 50 MG tablet   Other Relevant Orders   DG Lumbar Spine 2-3 Views (Completed)   Ambulatory referral to Orthopedic Surgery     Other   Musculoskeletal back pain    Secondary to lumbar strain.  Pain management discussed. May continue Tylenol for mild to moderate pain and start tramadol for moderate to severe pain.      Relevant Medications   traMADol (ULTRAM) 50 MG tablet   Other Relevant Orders   Ambulatory referral to Orthopedic Surgery   Patient Instructions  Acute Back Pain, Adult Acute back pain is sudden and usually short-lived. It is often caused by an injury to the muscles and tissues in the back. The injury may result from: A muscle, tendon, or ligament getting overstretched or torn. Ligaments are tissues that connect bones to each other. Lifting something improperly can cause a back strain. Wear and tear (degeneration) of the spinal disks. Spinal disks are circular tissue that provide cushioning between the bones of the spine (vertebrae). Twisting motions, such as while playing sports or doing yard work. A hit to the back. Arthritis. You may have a physical exam, lab tests, and imaging tests to find the cause of your pain. Acute back pain usually goes away with rest and home care. Follow these instructions at home: Managing pain, stiffness, and swelling Take over-the-counter and prescription medicines only as told by your health care provider. Treatment may include medicines for pain and inflammation that are taken by mouth or applied to the skin, or muscle relaxants. Your health care provider may recommend applying ice during the first 24-48 hours after your pain starts. To do this: Put ice in a plastic bag. Place a towel between your skin and the bag. Leave the ice on for 20 minutes, 2-3 times a day. Remove the ice if your skin turns bright red. This  is very important. If you cannot feel pain, heat, or cold, you have a greater risk of damage to the area. If directed, apply heat to the affected area as often as told by your health care provider. Use the heat source that your health care provider recommends, such as a moist heat pack or a heating pad. Place a towel between your skin and the heat source. Leave the heat on for 20-30 minutes. Remove the heat if your skin turns bright red. This is especially important if you are unable to feel pain, heat, or cold. You have a greater risk of getting burned. Activity  Do not stay in bed. Staying in bed for more than 1-2 days can delay your recovery. Sit up and stand up straight. Avoid leaning forward when you sit or hunching over when you stand. If you work at a desk, sit close to it so you do not need to lean over. Keep your chin tucked in. Keep your neck drawn back, and keep your elbows bent at a 90-degree angle (right angle). Sit high and close to the steering wheel when you drive. Add lower back (lumbar) support to your car seat, if needed. Take short walks on even surfaces as soon as you are able. Try to increase the length of time you walk each day. Do not sit, drive, or stand in one place for more than 30 minutes at a time.  Sitting or standing for long periods of time can put stress on your back. Do not drive or use heavy machinery while taking prescription pain medicine. Use proper lifting techniques. When you bend and lift, use positions that put less stress on your back: [place] your knees. Keep the load close to your body. Avoid twisting. Exercise regularly as told by your health care provider. Exercising helps your back heal faster and helps prevent back injuries by keeping muscles strong and flexible. Work with a physical therapist to make a safe exercise program, as recommended by your health care provider. Do any exercises as told by your physical therapist. Lifestyle Maintain a healthy  weight. Extra weight puts stress on your back and makes it difficult to have good posture. Avoid activities or situations that make you feel anxious or stressed. Stress and anxiety increase muscle tension and can make back pain worse. Learn ways to manage anxiety and stress, such as through exercise. General instructions Sleep on a firm mattress in a comfortable position. Try lying on your side with your knees slightly bent. If you lie on your back, put a pillow under your knees. Keep your head and neck in a straight line with your spine (neutral position) when using electronic equipment like smartphones or pads. To do this: Raise your smartphone or pad to look at it instead of bending your head or neck to look down. Put the smartphone or pad at the level of your face while looking at the screen. Follow your treatment plan as told by your health care provider. This may include: Cognitive or behavioral therapy. Acupuncture or massage therapy. Meditation or yoga. Contact a health care provider if: You have pain that is not relieved with rest or medicine. You have increasing pain going down into your legs or buttocks. Your pain does not improve after 2 weeks. You have pain at night. You lose weight without trying. You have a fever or chills. You develop nausea or vomiting. You develop abdominal pain. Get help right away if: You develop new bowel or bladder control problems. You have unusual weakness or numbness in your arms or legs. You feel faint. These symptoms may represent a serious problem that is an emergency. Do not wait to see if the symptoms will go away. Get medical help right away. Call your local emergency services (911 in the [place] Do not drive yourself to the hospital. Summary Acute back pain is sudden and usually short-lived. Use proper lifting techniques. When you bend and lift, use positions that put less stress on your back. Take over-the-counter and prescription medicines  only as told by your health care provider, and apply heat or ice as told. This information is not intended to replace advice given to you by your health care provider. Make sure you discuss any questions you have with your health care provider. Document Revised: 03/22/2020 Document Reviewed: 03/22/2020 Elsevier Patient Education  [place], MD [facility]

## 2021-11-26 DIAGNOSIS — H02132 Senile ectropion of right lower eyelid: Secondary | ICD-10-CM | POA: Diagnosis not present

## 2021-11-26 DIAGNOSIS — H04521 Eversion of right lacrimal punctum: Secondary | ICD-10-CM | POA: Diagnosis not present

## 2021-11-26 DIAGNOSIS — H04123 Dry eye syndrome of bilateral lacrimal glands: Secondary | ICD-10-CM | POA: Diagnosis not present

## 2021-11-26 DIAGNOSIS — H02532 Eyelid retraction right lower eyelid: Secondary | ICD-10-CM | POA: Diagnosis not present

## 2021-11-26 DIAGNOSIS — H04561 Stenosis of right lacrimal punctum: Secondary | ICD-10-CM | POA: Diagnosis not present

## 2021-11-26 DIAGNOSIS — H02135 Senile ectropion of left lower eyelid: Secondary | ICD-10-CM | POA: Diagnosis not present

## 2021-11-26 DIAGNOSIS — H16211 Exposure keratoconjunctivitis, right eye: Secondary | ICD-10-CM | POA: Diagnosis not present

## 2021-11-26 DIAGNOSIS — H04221 Epiphora due to insufficient drainage, right lacrimal gland: Secondary | ICD-10-CM | POA: Diagnosis not present

## 2021-11-26 DIAGNOSIS — H0270 Unspecified degenerative disorders of eyelid and periocular area: Secondary | ICD-10-CM | POA: Diagnosis not present

## 2021-12-02 ENCOUNTER — Ambulatory Visit (INDEPENDENT_AMBULATORY_CARE_PROVIDER_SITE_OTHER): Payer: Medicare HMO | Admitting: Orthopaedic Surgery

## 2021-12-02 ENCOUNTER — Encounter: Payer: Self-pay | Admitting: Orthopaedic Surgery

## 2021-12-02 VITALS — BP 149/82 | HR 71 | Ht 67.5 in | Wt 168.6 lb

## 2021-12-02 DIAGNOSIS — S32000A Wedge compression fracture of unspecified lumbar vertebra, initial encounter for closed fracture: Secondary | ICD-10-CM | POA: Insufficient documentation

## 2021-12-02 DIAGNOSIS — S32020A Wedge compression fracture of second lumbar vertebra, initial encounter for closed fracture: Secondary | ICD-10-CM

## 2021-12-02 NOTE — Progress Notes (Addendum)
Office Visit Note   Patient: Jeremy [name]           Date of Birth: 1940/07/16           MRN: [id number] Visit Date: 12/02/2021              Requested by: Horald Pollen, MD [address],  [place] [id number] PCP: Plotnikov, Evie Lacks, MD   Assessment & Plan: Visit Diagnoses:  1. Compression fracture of L2 vertebra, initial encounter ([place])     Plan: We discussed activity modification avoiding bending other activities that would make the fracture potentially settle more.  I will recheck him in 1 month AP lateral lumbar x-ray on return.  We discussed staying in a reclining position using a small pillow underneath his back keeping his back in extension and avoiding lifting.  We discussed alternate techniques if he dropped something on the floor and had to use a three-point brace to lean down and pick it up.  Follow-Up Instructions: No follow-ups on file.   Orders:  No orders of the defined types were placed in this encounter.  No orders of the defined types were placed in this encounter.     Procedures: No procedures performed   Clinical Data: No additional findings.   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  Normally gets fire department help get her back up but he states this time he tried to help get her up she weighs slightly more than 200 pounds and had sharp pain in his back.  This occurred 3 weeks ago.  He has had symptoms mostly on the right side but sometimes it is on his left side.  X-rays demonstrated L2 10% compression fracture.  He states his back still hurts him and cannot go to the gym band turning twisting can play golf.  He has been using some muscle relaxants also ibuprofen.  Review of Systems all other systems are noncontributory to HPI.  He does have a history of prostate cancer.  Last PSA 1 year ago was  0.07.   Objective: Vital Signs: BP (!) 149/82   Pulse 71   Ht 5' 7.5" (1.715 m)   Wt 168 lb 9.6 oz (76.5 kg)   BMI 26.02 kg/m   Physical Exam Constitutional:      Appearance: He is well-developed.  HENT:     Head: Normocephalic and atraumatic.     Right Ear: External ear normal.     Left Ear: External ear normal.  Eyes:     Pupils: Pupils are equal, round, and reactive to light.  Neck:     Thyroid: No thyromegaly.     Trachea: No tracheal deviation.  Cardiovascular:     Rate and Rhythm: Normal rate.  Pulmonary:     Effort: Pulmonary effort is normal.     Breath sounds: No wheezing.  Abdominal:     General: Bowel sounds are normal.     Palpations: Abdomen is soft.  Musculoskeletal:     Cervical back: Neck supple.  Skin:    General: Skin is warm and dry.     Capillary Refill: Capillary refill takes less than 2 seconds.  Neurological:     Mental Status: He is alert and oriented to person, place, and time.  Psychiatric:        Behavior: Behavior normal.  Thought Content: Thought content normal.        Judgment: Judgment normal.     Ortho Exam normal heel-toe gait.  He has some discomfort with forward bending.  Specialty Comments:  No specialty comments available.  Imaging: Narrative & Impression  CLINICAL DATA:  Back pain   EXAM: LUMBAR SPINE - 2-3 VIEW   COMPARISON:  CT abdomen and pelvis done on 11/11/2020   FINDINGS: There is mild decrease in height of upper endplate of body of L2 vertebra with 10% decrease in height. This finding is new in comparison with the CT done on 11/11/2020. Coarse trabeculae in the body of L3 vertebra suggests hemangioma. Alignment of posterior margins of vertebral bodies appears normal. Degenerative changes are noted with anterior bony spurs and facet hypertrophy, more so at L5-S1 level. There is disc space narrowing at the L5-S1 level. There is 1.4 cm calcific density in the right kidney. There is possible small 3 mm  calculus in the right kidney.   IMPRESSION: There is mild 10% decrease in height of upper endplate of body of L2 vertebra suggesting recent or old mild compression. This finding was not seen in CT done on 11/11/2020. Lumbar spondylosis with bony spurs, facet hypertrophy and disc space narrowing at multiple levels. 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   Right renal stones.     Electronically Signed   By: Elmer Picker M.D.   On: 11/25/2021 11:40       PMFS History: Patient Active Problem List   Diagnosis Date Noted   Lumbar compression fracture ([place]) 12/02/2021   Acute lumbar myofascial strain 11/25/2021   Musculoskeletal back pain 11/25/2021   Patellofemoral arthritis of right knee 07/30/2017   Chronic low back pain 11/12/2016   Seasonal allergic rhinitis due to pollen 09/21/2014   Elevated BP without diagnosis of hypertension 02/19/2014   Primary localized osteoarthrosis, lower leg 03/15/2013   Osteoarthritis of left knee 02/03/2013   Dyslipidemia 10/08/2011   CAROTID BRUIT 08/22/2009   SKIN CANCER, HX OF 05/04/2008   COLONIC POLYPS, HX OF 05/02/2007   OSTEOPENIA 11/18/2006   PROSTATE CANCER, HX OF 10/14/2006   Past Medical History:  Diagnosis Date   AK (actinic keratosis)    Allergic rhinitis    History of colonic polyps    Dr. Ardis Hughs- Due 2018   History of skin cancer    Osteopenia    Prostate cancer ([place])    hx of s/p XRT, seeds Dr. Risa Grill    Family History  Problem Relation Age of Onset   Liver disease Father    Cancer Father        pancreas ?   Cancer - Other Other        intestinal cancer    Past Surgical History:  Procedure Laterality Date   INSERTION PROSTATE RADIATION SEED     KNEE ARTHROSCOPY Right    KNEE SURGERY Left    TONSILLECTOMY     XRT  2008   Social History   Occupational History   Occupation: retired from [employer]  Tobacco Use   Smoking status: Some Days    Types: Cigars   Smokeless tobacco: Never   Tobacco  comments:    2 cigars a month  Vaping Use   Vaping Use: Never used  Substance and Sexual Activity   Alcohol use: Yes    Alcohol/week: 2.0 standard drinks of alcohol    Types: 2 Cans of beer per week   Drug use: No  Sexual activity: Yes

## 2021-12-03 ENCOUNTER — Other Ambulatory Visit (INDEPENDENT_AMBULATORY_CARE_PROVIDER_SITE_OTHER): Payer: Medicare HMO

## 2021-12-03 DIAGNOSIS — E785 Hyperlipidemia, unspecified: Secondary | ICD-10-CM | POA: Diagnosis not present

## 2021-12-03 DIAGNOSIS — Z0000 Encounter for general adult medical examination without abnormal findings: Secondary | ICD-10-CM | POA: Diagnosis not present

## 2021-12-03 LAB — COMPREHENSIVE METABOLIC PANEL
ALT: 19 U/L (ref 0–53)
AST: 21 U/L (ref 0–37)
Albumin: 3.8 g/dL (ref 3.5–5.2)
Alkaline Phosphatase: 94 U/L (ref 39–117)
BUN: 20 mg/dL (ref 6–23)
CO2: 29 mEq/L (ref 19–32)
Calcium: 8.7 mg/dL (ref 8.4–10.5)
Chloride: 108 mEq/L (ref 96–112)
Creatinine, Ser: 0.79 mg/dL (ref 0.40–1.50)
GFR: 83.27 mL/min (ref >60.00)
Glucose, Bld: 95 mg/dL (ref 70–99)
Potassium: 4.1 mEq/L (ref 3.5–5.1)
Sodium: 142 mEq/L (ref 135–145)
Total Bilirubin: 0.5 mg/dL (ref 0.2–1.2)
Total Protein: 6.6 g/dL (ref 6.0–8.3)

## 2021-12-03 LAB — LIPID PANEL
Cholesterol: 134 mg/dL (ref 0–200)
HDL: 43.5 mg/dL (ref >39.00)
LDL Cholesterol: 82 mg/dL (ref 0–99)
NonHDL: 90.48
Total CHOL/HDL Ratio: 3
Triglycerides: 42 mg/dL (ref 0.0–149.0)
VLDL: 8.4 mg/dL (ref 0.0–40.0)

## 2021-12-03 LAB — CBC WITH DIFFERENTIAL/PLATELET
Basophils Absolute: 0 10*3/uL (ref 0.0–0.1)
Basophils Relative: 0.5 % (ref 0.0–3.0)
Eosinophils Absolute: 0.3 10*3/uL (ref 0.0–0.7)
Eosinophils Relative: 5.2 % — ABNORMAL HIGH (ref 0.0–5.0)
HCT: 41.9 % (ref 39.0–52.0)
Hemoglobin: 14.3 g/dL (ref 13.0–17.0)
Lymphocytes Relative: 41.1 % (ref 12.0–46.0)
Lymphs Abs: 2.2 10*3/uL (ref 0.7–4.0)
MCHC: 34.3 g/dL (ref 30.0–36.0)
MCV: 100.9 fl — ABNORMAL HIGH (ref 78.0–100.0)
Monocytes Absolute: 0.7 10*3/uL (ref 0.1–1.0)
Monocytes Relative: 12 % (ref 3.0–12.0)
Neutro Abs: 2.2 10*3/uL (ref 1.4–7.7)
Neutrophils Relative %: 41.2 % — ABNORMAL LOW (ref 43.0–77.0)
Platelets: 198 10*3/uL (ref 150.0–400.0)
RBC: 4.15 Mil/uL — ABNORMAL LOW (ref 4.22–5.81)
RDW: 13.2 % (ref 11.5–15.5)
WBC: 5.4 10*3/uL (ref 4.0–10.5)

## 2021-12-03 LAB — TSH: TSH: 2.49 u[IU]/mL (ref 0.35–5.50)

## 2021-12-03 LAB — URINALYSIS
Hgb urine dipstick: NEGATIVE
Ketones, ur: NEGATIVE
Leukocytes,Ua: NEGATIVE
Nitrite: NEGATIVE
Specific Gravity, Urine: >=1.03 — AB (ref 1.000–1.030)
Total Protein, Urine: NEGATIVE
Urine Glucose: NEGATIVE
Urobilinogen, UA: 1 (ref 0.0–1.0)
pH: 5.5 (ref 5.0–8.0)

## 2021-12-08 ENCOUNTER — Encounter: Payer: Self-pay | Admitting: Internal Medicine

## 2021-12-08 ENCOUNTER — Ambulatory Visit (INDEPENDENT_AMBULATORY_CARE_PROVIDER_SITE_OTHER): Payer: Medicare HMO | Admitting: Internal Medicine

## 2021-12-08 VITALS — BP 118/70 | HR 56 | Temp 97.5°F | Ht 67.5 in | Wt 174.0 lb

## 2021-12-08 DIAGNOSIS — Z8546 Personal history of malignant neoplasm of prostate: Secondary | ICD-10-CM

## 2021-12-08 DIAGNOSIS — M549 Dorsalgia, unspecified: Secondary | ICD-10-CM

## 2021-12-08 DIAGNOSIS — S32020A Wedge compression fracture of second lumbar vertebra, initial encounter for closed fracture: Secondary | ICD-10-CM

## 2021-12-08 NOTE — Progress Notes (Signed)
Subjective:  Patient ID: Jeremy [name], male    DOB: 01-09-1941  Age: 81 y.o. MRN: [id number]  CC: Follow-up   HPI Jeremy [name] presents for LBP.  Wife falls - lymphoma since 2019. 2023 - Hospice will come   C/o LBP  Outpatient Medications Prior to Visit  Medication Sig Dispense Refill   azelastine (ASTELIN) 0.1 % nasal spray Place 2 sprays into both nostrils 2 (two) times daily. Use in each nostril as directed (Patient not taking: Reported on 12/08/2021) 30 mL 11   Cholecalciferol (VITAMIN D3) 50 MCG (2000 UT) capsule Take 1 capsule (2,000 Units total) by mouth daily. (Patient not taking: Reported on 12/08/2021) 100 capsule 3   methocarbamol (ROBAXIN) 500 MG tablet Take 1 tablet (500 mg total) by mouth 2 (two) times daily as needed for muscle spasms. (Patient not taking: Reported on 12/08/2021) 20 tablet 0   mometasone (NASONEX) 50 MCG/ACT nasal spray Place 2 sprays into the nose daily. (Patient not taking: Reported on 12/08/2021) 1 each 11   montelukast (SINGULAIR) 10 MG tablet Take 1 tablet (10 mg total) by mouth daily. (Patient not taking: Reported on 12/08/2021) 30 tablet 5   RYALTRIS 665-25 MCG/ACT SUSP Place 2 sprays into the nose 2 (two) times daily as needed. (Patient not taking: Reported on 12/08/2021) 29 g 5   traMADol (ULTRAM) 50 MG tablet  (Patient not taking: Reported on 12/08/2021)     trimethoprim-polymyxin b (POLYTRIM) ophthalmic solution Place 1 drop into the right eye every 6 (six) hours. (Patient not taking: Reported on 12/08/2021)     No facility-administered medications prior to visit.    ROS: Review of Systems  Constitutional:  Negative for appetite change, fatigue and unexpected weight change.  HENT:  Negative for congestion, nosebleeds, sneezing, sore throat and trouble swallowing.   Eyes:  Negative for itching and visual disturbance.  Respiratory:  Negative for cough.   Cardiovascular:  Negative for chest pain, palpitations and leg swelling.   Gastrointestinal:  Negative for abdominal distention, blood in stool, diarrhea and nausea.  Genitourinary:  Negative for frequency and hematuria.  Musculoskeletal:  Positive for back pain. Negative for gait problem, joint swelling and neck pain.  Skin:  Negative for rash.  Neurological:  Negative for dizziness, tremors, speech difficulty and weakness.  Psychiatric/Behavioral:  Negative for agitation, dysphoric mood, sleep disturbance and suicidal ideas. The patient is not nervous/anxious.     Objective:  BP 118/70 (BP Location: Left Arm, Patient Position: Sitting, Cuff Size: Normal)   Pulse (!) 56   Temp (!) 97.5 F (36.4 C) (Oral)   Ht 5' 7.5" (1.715 m)   Wt 174 lb (78.9 kg)   SpO2 99%   BMI 26.85 kg/m   BP Readings from Last 3 Encounters:  12/08/21 118/70  12/02/21 (!) 149/82  11/25/21 132/72    Wt Readings from Last 3 Encounters:  12/08/21 174 lb (78.9 kg)  12/02/21 168 lb 9.6 oz (76.5 kg)  11/25/21 177 lb 2 oz (80.3 kg)    Physical Exam Constitutional:      General: He is not in acute distress.    Appearance: Normal appearance. He is well-developed.     Comments: NAD  Eyes:     Conjunctiva/sclera: Conjunctivae normal.     Pupils: Pupils are equal, round, and reactive to light.  Neck:     Thyroid: No thyromegaly.     Vascular: No JVD.  Cardiovascular:     Rate and Rhythm: Normal rate and regular  rhythm.     Heart sounds: Normal heart sounds. No murmur heard.    No friction rub. No gallop.  Pulmonary:     Effort: Pulmonary effort is normal. No respiratory distress.     Breath sounds: Normal breath sounds. No wheezing or rales.  Chest:     Chest wall: No tenderness.  Abdominal:     General: Bowel sounds are normal. There is no distension.     Palpations: Abdomen is soft. There is no mass.     Tenderness: There is no abdominal tenderness. There is no guarding or rebound.  Musculoskeletal:        General: No tenderness. Normal range of motion.     Cervical  back: Normal range of motion.  Lymphadenopathy:     Cervical: No cervical adenopathy.  Skin:    General: Skin is warm and dry.     Findings: No rash.  Neurological:     Mental Status: He is alert and oriented to person, place, and time.     Cranial Nerves: No cranial nerve deficit.     Motor: No abnormal muscle tone.     Coordination: Coordination normal.     Gait: Gait abnormal.     Deep Tendon Reflexes: Reflexes are normal and symmetric.  Psychiatric:        Behavior: Behavior normal.        Thought Content: Thought content normal.        Judgment: Judgment normal.   LS w/pain   Lab Results  Component Value Date   WBC 5.4 12/03/2021   HGB 14.3 12/03/2021   HCT 41.9 12/03/2021   PLT 198.0 12/03/2021   GLUCOSE 95 12/03/2021   CHOL 134 12/03/2021   TRIG 42.0 12/03/2021   HDL 43.50 12/03/2021   LDLCALC 82 12/03/2021   ALT 19 12/03/2021   AST 21 12/03/2021   NA 142 12/03/2021   K 4.1 12/03/2021   CL 108 12/03/2021   CREATININE 0.79 12/03/2021   BUN 20 12/03/2021   CO2 29 12/03/2021   TSH 2.49 12/03/2021   PSA 0.07 (L) 05/30/2020   INR 1.0 ratio 03/27/2008    US Venous Img Upper Uni Left  Result Date: 07/01/2021 CLINICAL DATA:  Left hand swelling and pain EXAM: LEFT UPPER EXTREMITY VENOUS DOPPLER ULTRASOUND TECHNIQUE: Gray-scale sonography with graded compression, as well as color Doppler and duplex ultrasound were performed to evaluate the upper extremity deep venous system from the level of the subclavian vein and including the jugular, axillary, basilic, radial, ulnar and upper cephalic vein. Spectral Doppler was utilized to evaluate flow at rest and with distal augmentation maneuvers. COMPARISON:  None Available. FINDINGS: Contralateral Subclavian Vein: Respiratory phasicity is normal and symmetric with the symptomatic side. No evidence of thrombus. Normal compressibility. Internal Jugular Vein: No evidence of thrombus. Normal compressibility, respiratory phasicity and  response to augmentation. Subclavian Vein: No evidence of thrombus. Normal compressibility, respiratory phasicity and response to augmentation. Axillary Vein: No evidence of thrombus. Normal compressibility, respiratory phasicity and response to augmentation. Cephalic Vein: No evidence of thrombus. Normal compressibility, respiratory phasicity and response to augmentation. Basilic Vein: No evidence of thrombus. Normal compressibility, respiratory phasicity and response to augmentation. Brachial Veins: No evidence of thrombus. Normal compressibility, respiratory phasicity and response to augmentation. Radial Veins: No evidence of thrombus. Normal compressibility, respiratory phasicity and response to augmentation. Ulnar Veins: No evidence of thrombus. Normal compressibility, respiratory phasicity and response to augmentation. Venous Reflux:  None visualized. Other Findings:  None visualized.  IMPRESSION: Negative examination for deep venous thrombosis in the left upper extremity. Electronically Signed   By: Delanna Ahmadi M.D.   On: 07/01/2021 13:27    Assessment & Plan:   Problem List Items Addressed This Visit     Lumbar compression fracture ([place]) - Primary    New: Pt lifted his wife: mild 10% decrease in height of upper endplate of body of L2 vertebra suggesting recent or old mild compression.      Relevant Orders   DG Bone Density   Musculoskeletal back pain    New: Pt lifted his wife: mild 10% decrease in height of upper endplate of body of L2 vertebra suggesting recent or old mild compression. No golf x 2 month DEXA scan      PROSTATE CANCER, HX OF    Monitor PSA - due in Dec 2023         No orders of the defined types were placed in this encounter.     Follow-up: Return in about 3 months (around 03/10/2022) for a follow-up visit.  Walker Kehr, MD

## 2021-12-08 NOTE — Assessment & Plan Note (Addendum)
Monitor PSA - due in Dec 2023

## 2021-12-08 NOTE — Assessment & Plan Note (Addendum)
New: Pt lifted his wife: mild 10% decrease in height of upper endplate of body of L2 vertebra suggesting recent or old mild compression. No golf x 2 month DEXA scan

## 2021-12-08 NOTE — Assessment & Plan Note (Signed)
New: Pt lifted his wife: mild 10% decrease in height of upper endplate of body of L2 vertebra suggesting recent or old mild compression.

## 2021-12-11 DIAGNOSIS — L578 Other skin changes due to chronic exposure to nonionizing radiation: Secondary | ICD-10-CM | POA: Diagnosis not present

## 2021-12-11 DIAGNOSIS — L570 Actinic keratosis: Secondary | ICD-10-CM | POA: Diagnosis not present

## 2021-12-23 ENCOUNTER — Ambulatory Visit (INDEPENDENT_AMBULATORY_CARE_PROVIDER_SITE_OTHER)
Admission: RE | Admit: 2021-12-23 | Discharge: 2021-12-23 | Disposition: A | Discharge status: Home / Self Care | Payer: Medicare HMO | Source: Ambulatory Visit | Attending: Internal Medicine | Admitting: Internal Medicine

## 2021-12-23 DIAGNOSIS — S32020A Wedge compression fracture of second lumbar vertebra, initial encounter for closed fracture: Secondary | ICD-10-CM

## 2022-01-02 ENCOUNTER — Encounter: Payer: Self-pay | Admitting: Orthopaedic Surgery

## 2022-01-02 ENCOUNTER — Ambulatory Visit: Payer: Medicare HMO | Admitting: Orthopaedic Surgery

## 2022-01-02 ENCOUNTER — Ambulatory Visit (INDEPENDENT_AMBULATORY_CARE_PROVIDER_SITE_OTHER): Payer: Medicare HMO

## 2022-01-02 VITALS — BP 127/56 | HR 82 | Ht 67.5 in | Wt 174.0 lb

## 2022-01-02 DIAGNOSIS — S32020A Wedge compression fracture of second lumbar vertebra, initial encounter for closed fracture: Secondary | ICD-10-CM | POA: Diagnosis not present

## 2022-01-02 DIAGNOSIS — H524 Presbyopia: Secondary | ICD-10-CM | POA: Diagnosis not present

## 2022-01-02 NOTE — Progress Notes (Unsigned)
Office Visit Note   Patient: Jeremy [name]           Date of Birth: November 30, 1940           MRN: [id number] Visit Date: 01/02/2022              Requested by: Cassandria Anger, MD [address],  [place] [id number] PCP: Plotnikov, Evie Lacks, MD   Assessment & Plan: Visit Diagnoses:  1. Compression fracture of L2 vertebra, initial encounter ([place])     Plan: Lumbar compression fracture stable with resolution of symptoms consistent with interval healing.  He can return if he has increased symptoms and can gradually slowly resume activities avoiding having lifting for a month or 2.  Follow-Up Instructions: No follow-ups on file.   Orders:  Orders Placed This Encounter  Procedures   XR Lumbar Spine 2-3 Views   No orders of the defined types were placed in this encounter.     Procedures: No procedures performed   Clinical Data: No additional findings.   Subjective: Chief Complaint  Patient presents with   Lower Back - Follow-up, Fracture    HPI 81 year old male returns for follow-up with L2 fracture.  States he has mild discomfort pain across lumbar spine.  He has not been able to swing the golf club at this point.  He is using topical rubs but not taking any narcotic medication.  All other systems noncontributory to HPI no lower extremity weakness.  He is avoiding bending and lifting.  Review of Systems all systems updated noncontributory.   Objective: Vital Signs: BP (!) 127/56   Pulse 82   Ht 5' 7.5" (1.715 m)   Wt 174 lb (78.9 kg)   BMI 26.85 kg/m   Physical Exam Constitutional:      Appearance: He is well-developed.  HENT:     Head: Normocephalic and atraumatic.     Right Ear: External ear normal.     Left Ear: External ear normal.  Eyes:     Pupils: Pupils are equal, round, and reactive to light.  Neck:     Thyroid: No thyromegaly.     Trachea: No tracheal deviation.  Cardiovascular:     Rate and Rhythm: Normal rate.  Pulmonary:      Effort: Pulmonary effort is normal.     Breath sounds: No wheezing.  Abdominal:     General: Bowel sounds are normal.     Palpations: Abdomen is soft.  Musculoskeletal:     Cervical back: Neck supple.  Skin:    General: Skin is warm and dry.     Capillary Refill: Capillary refill takes less than 2 seconds.  Neurological:     Mental Status: He is alert and oriented to person, place, and time.  Psychiatric:        Behavior: Behavior normal.        Thought Content: Thought content normal.        Judgment: Judgment normal.     Ortho Exam good hip flexion strength normal hip range of motion.  Sensation is intact.  Specialty Comments:  No specialty comments available.  Imaging: No results found.   PMFS History: Patient Active Problem List   Diagnosis Date Noted   Lumbar compression fracture ([place]) 12/02/2021   Acute lumbar myofascial strain 11/25/2021   Musculoskeletal back pain 11/25/2021   Patellofemoral arthritis of right knee 07/30/2017   Chronic low back pain 11/12/2016   Seasonal allergic rhinitis due to pollen 09/21/2014  Elevated BP without diagnosis of hypertension 02/19/2014   Primary localized osteoarthrosis, lower leg 03/15/2013   Osteoarthritis of left knee 02/03/2013   Dyslipidemia 10/08/2011   CAROTID BRUIT 08/22/2009   SKIN CANCER, HX OF 05/04/2008   COLONIC POLYPS, HX OF 05/02/2007   OSTEOPENIA 11/18/2006   PROSTATE CANCER, HX OF 10/14/2006   Past Medical History:  Diagnosis Date   AK (actinic keratosis)    Allergic rhinitis    History of colonic polyps    Dr. Ardis Hughs- Due 2018   History of skin cancer    Osteopenia    Prostate cancer ([place])    hx of s/p XRT, seeds Dr. Risa Grill    Family History  Problem Relation Age of Onset   Liver disease Father    Cancer Father        pancreas ?   Cancer - Other Other        intestinal cancer    Past Surgical History:  Procedure Laterality Date   INSERTION PROSTATE RADIATION SEED     KNEE ARTHROSCOPY  Right    KNEE SURGERY Left    TONSILLECTOMY     XRT  2008   Social History   Occupational History   Occupation: retired from [employer]  Tobacco Use   Smoking status: Some Days    Types: Cigars   Smokeless tobacco: Never   Tobacco comments:    2 cigars a month  Vaping Use   Vaping Use: Never used  Substance and Sexual Activity   Alcohol use: Yes    Alcohol/week: 2.0 standard drinks of alcohol    Types: 2 Cans of beer per week   Drug use: No   Sexual activity: Yes

## 2022-01-19 DIAGNOSIS — C61 Malignant neoplasm of prostate: Secondary | ICD-10-CM | POA: Diagnosis not present

## 2022-01-26 DIAGNOSIS — C61 Malignant neoplasm of prostate: Secondary | ICD-10-CM | POA: Diagnosis not present

## 2022-01-26 DIAGNOSIS — N200 Calculus of kidney: Secondary | ICD-10-CM | POA: Diagnosis not present

## 2022-01-26 DIAGNOSIS — R311 Benign essential microscopic hematuria: Secondary | ICD-10-CM | POA: Diagnosis not present

## 2022-01-30 ENCOUNTER — Telehealth: Payer: Self-pay | Admitting: Internal Medicine

## 2022-01-30 NOTE — Telephone Encounter (Signed)
Left message for patient to call back to schedule Medicare Annual Wellness Visit   Last AWV  02/21/22  Please schedule at anytime with [facility] if patient calls the office back.    30 Minutes appointment   Any questions, please call me at [phone]

## 2022-02-02 ENCOUNTER — Ambulatory Visit (INDEPENDENT_AMBULATORY_CARE_PROVIDER_SITE_OTHER): Payer: Medicare HMO | Admitting: Sports Medicine

## 2022-02-02 ENCOUNTER — Ambulatory Visit (INDEPENDENT_AMBULATORY_CARE_PROVIDER_SITE_OTHER): Payer: Medicare HMO

## 2022-02-02 VITALS — BP 120/78 | HR 78 | Ht 67.0 in | Wt 174.0 lb

## 2022-02-02 DIAGNOSIS — M25551 Pain in right hip: Secondary | ICD-10-CM | POA: Diagnosis not present

## 2022-02-02 DIAGNOSIS — M1611 Unilateral primary osteoarthritis, right hip: Secondary | ICD-10-CM

## 2022-02-02 MED ORDER — MELOXICAM 15 MG PO TABS: 15.0000 mg | ORAL_TABLET | Freq: Every day | ORAL | 0 refills | Status: DC

## 2022-02-02 NOTE — Progress Notes (Signed)
Jeremy [name] D.[place] Phone: [phone]   Assessment and Plan:     1. Right hip pain 2. Primary osteoarthritis of right hip -Chronic with exacerbation, initial sports medicine visit - Most consistent with flare of osteoarthritis of right hip based on HPI, physical exam, x-ray.  Patient also experiencing pain at right ASIS potentially due to sartorius - Start HEP for hip - Start meloxicam 15 mg daily x2 weeks.  If still having pain after 2 weeks, complete 3rd-week of meloxicam. May use remaining meloxicam as needed once daily for pain control.  Do not to use additional NSAIDs while taking meloxicam.  May use Tylenol [phone] mg 2 to 3 times a day for breakthrough pain. - X-ray obtained in clinic.  My interpretation: No acute fracture or dislocation.  Decreased femoral acetabular joint space bilaterally.  Acetabular sclerosis and femoral head cortical changes bilaterally.  ASIS enthesophyte bilaterally.  Surgical clips with past medical history of prostate cancer status post prostatectomy  Other orders - meloxicam (MOBIC) 15 MG tablet; Take 1 tablet (15 mg total) by mouth daily.    Pertinent previous records reviewed include none   Follow Up: 3 to 4 weeks for reevaluation.  Would discontinue meloxicam at that time.  Could consider physical therapy versus [facility]SI if no improvement or worsening of symptoms   Subjective:   I, Jeremy [name], am serving as a Education administrator for Doctor Jeremy [name]  Chief Complaint: right hip pain   HPI:   04/03/2020 Chronic problem with exacerbation.  Repeat injection given today.  Last injection was November 2020.  Hopefully patient will continue to respond.  Discussed icing regimen.  Continue bracing, icing, topical anti-inflammatories.  Follow-up with me again in 2 months.   Updated 04/10/2021 Jeremy [name] is a 82 y.o. male coming in with complaint of left knee pain. Patient  states that his pain is worse. Has good and bad days though. Pain mostly over lateral joint line. Pain is now bothering him at night when it was not before. Injection did give him immediate relief.   02/02/22 Patient is a 82 year old male complaining of right hip pain. 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   Current Outpatient Medications:    azelastine (ASTELIN) 0.1 % nasal spray, Place 2 sprays into both nostrils 2 (two) times daily. Use in each nostril as directed, Disp: 30 mL, Rfl: 11   Cholecalciferol (VITAMIN D3) 50 MCG (2000 UT) capsule, Take 1 capsule (2,000 Units total) by mouth daily., Disp: 100 capsule, Rfl: 3   meloxicam (MOBIC) 15 MG tablet, Take 1 tablet (15 mg total) by mouth daily., Disp: 30 tablet, Rfl: 0   methocarbamol (ROBAXIN) 500 MG tablet, Take 1 tablet (500 mg total) by mouth 2 (two) times daily as needed for muscle spasms., Disp: 20 tablet, Rfl: 0   mometasone (NASONEX) 50 MCG/ACT  nasal spray, Place 2 sprays into the nose daily., Disp: 1 each, Rfl: 11   montelukast (SINGULAIR) 10 MG tablet, Take 1 tablet (10 mg total) by mouth daily., Disp: 30 tablet, Rfl: 5   RYALTRIS 665-25 MCG/ACT SUSP, Place 2 sprays into the nose 2 (two) times daily as needed., Disp: 29 g, Rfl: 5   traMADol (ULTRAM) 50 MG tablet, , Disp: , Rfl:    trimethoprim-polymyxin b (POLYTRIM) ophthalmic solution, Place 1 drop into the right eye every 6 (six) hours., Disp: , Rfl:    Objective:      Vitals:   02/02/22 1454  BP: 120/78  Pulse: 78  SpO2: 100%  Weight: 174 lb (78.9 kg)  Height: '5\' 7"'$  (1.702 m)      Body mass index is 27.25 kg/m.    Physical Exam:    General: awake, alert, and oriented no acute distress, nontoxic Skin: no suspicious lesions or rashes Neuro:sensation intact distally with no dificits, normal muscle tone, no atrophy, strength 5/5 in all tested lower ext groups Psych: normal mood and affect, speech clear  Right hip: No deformity, swelling or wasting ROM Flexion 90, ext 20, IR 30, ER 35 TTP ASIS NTTP over the hip flexors, greater trochanter, gluteal musculature, si joint, lumbar spine Positive log roll with FROM Positive FABER Positive FADIR Negative Piriformis test Negative trendelenberg Gait normal    Electronically signed by:  Jeremy [name] D.[facility] 3:21 PM 02/02/22

## 2022-02-02 NOTE — Patient Instructions (Addendum)
Good to see you  Can buy Voltaren gel and apply topically over areas of pain  - Start meloxicam 15 mg daily x2 weeks.  If still having pain after 2 weeks, complete 3rd-week of meloxicam. May use remaining meloxicam as needed once daily for pain control.  Do not to use additional NSAIDs while taking meloxicam.  May use Tylenol [phone] mg 2 to 3 times a day for breakthrough pain. Hip HEP  3-4 week follow up

## 2022-02-06 DIAGNOSIS — L578 Other skin changes due to chronic exposure to nonionizing radiation: Secondary | ICD-10-CM | POA: Diagnosis not present

## 2022-02-06 DIAGNOSIS — L570 Actinic keratosis: Secondary | ICD-10-CM | POA: Diagnosis not present

## 2022-02-26 DIAGNOSIS — R69 Illness, unspecified: Secondary | ICD-10-CM | POA: Diagnosis not present

## 2022-02-27 NOTE — Progress Notes (Unsigned)
    Jeremy [name] D.[place] Phone: [phone]   Assessment and Plan:     There are no diagnoses linked to this encounter.  ***   Pertinent previous records reviewed include ***   Follow Up: ***     Subjective:   I, Jeremy [name], am serving as a Education administrator for Jeremy [name]   Chief Complaint: right hip pain    HPI:    04/03/2020 Chronic problem with exacerbation.  Repeat injection given today.  Last injection was November 2020.  Hopefully patient will continue to respond.  Discussed icing regimen.  Continue bracing, icing, topical anti-inflammatories.  Follow-up with me again in 2 months.   Updated 04/10/2021 Jeremy [name] is a 82 y.o. male coming in with complaint of left knee pain. Patient states that his pain is worse. Has good and bad days though. Pain mostly over lateral joint line. Pain is now bothering him at night when it was not before. Injection did give him immediate relief.    02/02/22 Patient is a 82 year old male complaining of right hip pain. 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   Current Outpatient Medications:    azelastine (ASTELIN) 0.1 % nasal spray, Place 2 sprays into both nostrils 2 (two) times daily. Use in each nostril as directed, Disp: 30 mL, Rfl: 11   Cholecalciferol (VITAMIN D3) 50 MCG (2000 UT) capsule, Take 1  capsule (2,000 Units total) by mouth daily., Disp: 100 capsule, Rfl: 3   meloxicam (MOBIC) 15 MG tablet, Take 1 tablet (15 mg total) by mouth daily., Disp: 30 tablet, Rfl: 0   methocarbamol (ROBAXIN) 500 MG tablet, Take 1 tablet (500 mg total) by mouth 2 (two) times daily as needed for muscle spasms., Disp: 20 tablet, Rfl: 0   mometasone (NASONEX) 50 MCG/ACT nasal spray, Place 2 sprays into the nose daily., Disp: 1 each, Rfl: 11   montelukast (SINGULAIR) 10 MG tablet, Take 1 tablet (10 mg total) by mouth daily., Disp: 30 tablet, Rfl: 5   RYALTRIS 665-25 MCG/ACT SUSP, Place 2 sprays into the nose 2 (two) times daily as needed., Disp: 29 g, Rfl: 5   traMADol (ULTRAM) 50 MG tablet, , Disp: , Rfl:    trimethoprim-polymyxin b (POLYTRIM) ophthalmic solution, Place 1 drop into the right eye every 6 (six) hours., Disp: , Rfl:    Objective:     There were no vitals filed for this visit.    There is no height or weight on file to calculate BMI.    Physical Exam:    ***   Electronically signed by:  Jeremy [name] D.[facility] 7:35 AM 02/27/22

## 2022-03-02 ENCOUNTER — Ambulatory Visit (INDEPENDENT_AMBULATORY_CARE_PROVIDER_SITE_OTHER): Payer: Medicare HMO | Admitting: Sports Medicine

## 2022-03-02 VITALS — BP 120/78 | HR 70 | Ht 67.0 in | Wt 175.0 lb

## 2022-03-02 DIAGNOSIS — M25551 Pain in right hip: Secondary | ICD-10-CM

## 2022-03-02 DIAGNOSIS — M1611 Unilateral primary osteoarthritis, right hip: Secondary | ICD-10-CM

## 2022-03-02 NOTE — Patient Instructions (Addendum)
Good to see you  Discontinue meloxicam and use remainder as needed  Tylenol for day to day pain relief use ibuprofen for breakthrough pain  As needed follow up

## 2022-03-05 DIAGNOSIS — R69 Illness, unspecified: Secondary | ICD-10-CM | POA: Diagnosis not present

## 2022-03-06 ENCOUNTER — Ambulatory Visit (INDEPENDENT_AMBULATORY_CARE_PROVIDER_SITE_OTHER): Payer: Medicare HMO

## 2022-03-06 VITALS — BP 122/60 | HR 71 | Temp 97.4°F | Ht 67.0 in | Wt 174.4 lb

## 2022-03-06 DIAGNOSIS — Z0000 Encounter for general adult medical examination without abnormal findings: Secondary | ICD-10-CM | POA: Diagnosis not present

## 2022-03-06 NOTE — Patient Instructions (Addendum)
Jeremy [name] , Thank you for taking time to come for your Medicare Wellness Visit. I appreciate your ongoing commitment to your health goals. Please review the following plan we discussed and let me know if I can assist you in the future.   These are the goals we discussed:  Goals      My goal for 2024 is to continue going to the gym and playing golf.        This is a list of the screening recommended for you and due dates:  Health Maintenance  Topic Date Due   COVID-19 Vaccine (4 - 2023-24 season) 09/12/2021   Medicare Annual Wellness Visit  03/07/2023   DTaP/Tdap/Td vaccine (3 - Td or Tdap) 09/27/2028   Pneumonia Vaccine  Completed   Flu Shot  Completed   Zoster (Shingles) Vaccine  Completed   HPV Vaccine  Aged Out    Advanced directives: Yes  Conditions/risks identified: Yes  Next appointment: Follow up in one year for your annual wellness visit.   Preventive Care 82 Years and Older, Male  Preventive care refers to lifestyle choices and visits with your health care provider that can promote health and wellness. What does preventive care include? A yearly physical exam. This is also called an annual well check. Dental exams once or twice a year. Routine eye exams. Ask your health care provider how often you should have your eyes checked. Personal lifestyle choices, including: Daily care of your teeth and gums. Regular physical activity. Eating a healthy diet. Avoiding tobacco and drug use. Limiting alcohol use. Practicing safe sex. Taking low doses of aspirin every day. Taking vitamin and mineral supplements as recommended by your health care provider. What happens during an annual well check? The services and screenings done by your health care provider during your annual well check will depend on your age, overall health, lifestyle risk factors, and family history of disease. Counseling  Your health care provider may ask you questions about your: Alcohol  use. Tobacco use. Drug use. Emotional well-being. Home and relationship well-being. Sexual activity. Eating habits. History of falls. Memory and ability to understand (cognition). Work and work Statistician. Screening  You may have the following tests or measurements: Height, weight, and BMI. Blood pressure. Lipid and cholesterol levels. These may be checked every 5 years, or more frequently if you are over 7 years old. Skin check. Lung cancer screening. You may have this screening every year starting at age 82 if you have a 30-pack-year history of smoking and currently smoke or have quit within the past 15 years. Fecal occult blood test (FOBT) of the stool. You may have this test every year starting at age 82. Flexible sigmoidoscopy or colonoscopy. You may have a sigmoidoscopy every 5 years or a colonoscopy every 10 years starting at age 82. Prostate cancer screening. Recommendations will vary depending on your family history and other risks. Hepatitis C blood test. Hepatitis B blood test. Sexually transmitted disease (STD) testing. Diabetes screening. This is done by checking your blood sugar (glucose) after you have not eaten for a while (fasting). You may have this done every 1-3 years. Abdominal aortic aneurysm (AAA) screening. You may need this if you are a current or former smoker. Osteoporosis. You may be screened starting at age 82 if you are at high risk. Talk with your health care provider about your test results, treatment options, and if necessary, the need for more tests. Vaccines  Your health care provider may recommend certain  vaccines, such as: Influenza vaccine. This is recommended every year. Tetanus, diphtheria, and acellular pertussis (Tdap, Td) vaccine. You may need a Td booster every 10 years. Zoster vaccine. You may need this after age 82. Pneumococcal 13-valent conjugate (PCV13) vaccine. One dose is recommended after age 82. Pneumococcal polysaccharide  (PPSV23) vaccine. One dose is recommended after age 82. Talk to your health care provider about which screenings and vaccines you need and how often you need them. This information is not intended to replace advice given to you by your health care provider. Make sure you discuss any questions you have with your health care provider. Document Released: 01/25/2015 Document Revised: 09/18/2015 Document Reviewed: 10/30/2014 Elsevier Interactive Patient Education  2017 [place] Prevention in the Home Falls can cause injuries. They can happen to people of all ages. There are many things you can do to make your home safe and to help prevent falls. What can I do on the outside of my home? Regularly fix the edges of walkways and driveways and fix any cracks. Remove anything that might make you trip as you walk through a door, such as a raised step or threshold. Trim any bushes or trees on the path to your home. Use bright outdoor lighting. Clear any walking paths of anything that might make someone trip, such as rocks or tools. Regularly check to see if handrails are loose or broken. Make sure that both sides of any steps have handrails. Any raised decks and porches should have guardrails on the edges. Have any leaves, snow, or ice cleared regularly. Use sand or salt on walking paths during winter. Clean up any spills in your garage right away. This includes oil or grease spills. What can I do in the bathroom? Use night lights. Install grab bars by the toilet and in the tub and shower. Do not use towel bars as grab bars. Use non-skid mats or decals in the tub or shower. If you need to sit down in the shower, use a plastic, non-slip stool. Keep the floor dry. Clean up any water that spills on the floor as soon as it happens. Remove soap buildup in the tub or shower regularly. Attach bath mats securely with double-sided non-slip rug tape. Do not have throw rugs and other things on the  floor that can make you trip. What can I do in the bedroom? Use night lights. Make sure that you have a light by your bed that is easy to reach. Do not use any sheets or blankets that are too big for your bed. They should not hang down onto the floor. Have a firm chair that has side arms. You can use this for support while you get dressed. Do not have throw rugs and other things on the floor that can make you trip. What can I do in the kitchen? Clean up any spills right away. Avoid walking on wet floors. Keep items that you use a lot in easy-to-reach places. If you need to reach something above you, use a strong step stool that has a grab bar. Keep electrical cords out of the way. Do not use floor polish or wax that makes floors slippery. If you must use wax, use non-skid floor wax. Do not have throw rugs and other things on the floor that can make you trip. What can I do with my stairs? Do not leave any items on the stairs. Make sure that there are handrails on both sides of the stairs  and use them. Fix handrails that are broken or loose. Make sure that handrails are as long as the stairways. Check any carpeting to make sure that it is firmly attached to the stairs. Fix any carpet that is loose or worn. Avoid having throw rugs at the top or bottom of the stairs. If you do have throw rugs, attach them to the floor with carpet tape. Make sure that you have a light switch at the top of the stairs and the bottom of the stairs. If you do not have them, ask someone to add them for you. What else can I do to help prevent falls? Wear shoes that: Do not have high heels. Have rubber bottoms. Are comfortable and fit you well. Are closed at the toe. Do not wear sandals. If you use a stepladder: Make sure that it is fully opened. Do not climb a closed stepladder. Make sure that both sides of the stepladder are locked into place. Ask someone to hold it for you, if possible. Clearly mark and make  sure that you can see: Any grab bars or handrails. First and last steps. Where the edge of each step is. Use tools that help you move around (mobility aids) if they are needed. These include: Canes. Walkers. Scooters. Crutches. Turn on the lights when you go into a dark area. Replace any light bulbs as soon as they burn out. Set up your furniture so you have a clear path. Avoid moving your furniture around. If any of your floors are uneven, fix them. If there are any pets around you, be aware of where they are. Review your medicines with your doctor. Some medicines can make you feel dizzy. This can increase your chance of falling. Ask your doctor what other things that you can do to help prevent falls. This information is not intended to replace advice given to you by your health care provider. Make sure you discuss any questions you have with your health care provider. Document Released: 10/25/2008 Document Revised: 06/06/2015 Document Reviewed: 02/02/2014 Elsevier Interactive Patient Education  2017 [employer].

## 2022-03-06 NOTE — Progress Notes (Addendum)
Subjective:   Jeremy [name] is a 82 y.o. male who presents for Medicare Annual/Subsequent preventive examination.  Review of Systems     Cardiac Risk Factors include: advanced age (>48men, >38 women);male gender;smoking/ tobacco exposure;Other (see comment) (cigars)     Objective:    Today's Vitals   03/06/22 0926  BP: 122/60  Pulse: 71  Temp: (!) 97.4 F (36.3 C)  TempSrc: Temporal  SpO2: 99%  Weight: 174 lb 6.4 oz (79.1 kg)  Height: 5\' 7"  (1.702 m)  PainSc: 0-No pain   Body mass index is 27.31 kg/m.     03/06/2022    9:38 AM 07/01/2021   11:10 AM 02/21/2021    3:02 PM 12/27/2019    3:45 PM 04/21/2019   12:58 PM 10/12/2016    1:31 PM 11/07/2015    9:55 AM  Advanced Directives  Does Patient Have a Medical Advance Directive? Yes No Yes Yes Yes Yes Yes  Type of Estate agent of [place];Living will  Healthcare Power of [employer] [place];Living will Healthcare Power of [place];Living will  Does patient want to make changes to medical advance directive?    No - Patient declined   No - Patient declined  Copy of Healthcare Power of Attorney in Chart? No - copy requested  No - copy requested   No - copy requested No - copy requested    Current Medications (verified) Outpatient Encounter Medications as of 03/06/2022  Medication Sig   azelastine (ASTELIN) 0.1 % nasal spray Place 2 sprays into both nostrils 2 (two) times daily. Use in each nostril as directed   Cholecalciferol (VITAMIN D3) 50 MCG (2000 UT) capsule Take 1 capsule (2,000 Units total) by mouth daily.   meloxicam (MOBIC) 15 MG tablet Take 1 tablet (15 mg total) by mouth daily. (Patient not taking: Reported on 03/06/2022)   methocarbamol (ROBAXIN) 500 MG tablet Take 1 tablet (500 mg total) by mouth 2 (two) times daily as needed for muscle spasms. (Patient not taking: Reported on 03/06/2022)   mometasone (NASONEX) 50 MCG/ACT nasal spray Place 2 sprays into the nose daily.  (Patient not taking: Reported on 03/06/2022)   montelukast (SINGULAIR) 10 MG tablet Take 1 tablet (10 mg total) by mouth daily. (Patient not taking: Reported on 03/06/2022)   RYALTRIS 665-25 MCG/ACT SUSP Place 2 sprays into the nose 2 (two) times daily as needed. (Patient not taking: Reported on 03/06/2022)   traMADol (ULTRAM) 50 MG tablet  (Patient not taking: Reported on 03/06/2022)   trimethoprim-polymyxin b (POLYTRIM) ophthalmic solution Place 1 drop into the right eye every 6 (six) hours. (Patient not taking: Reported on 03/06/2022)   No facility-administered encounter medications on file as of 03/06/2022.    Allergies (verified) Aspirin   History: Past Medical History:  Diagnosis Date   AK (actinic keratosis)    Allergic rhinitis    History of colonic polyps    Dr. Christella Hartigan- Due 2018   History of skin cancer    Osteopenia    Prostate cancer (HCC)    hx of s/p XRT, seeds Dr. Isabel Caprice   Past Surgical History:  Procedure Laterality Date   INSERTION PROSTATE RADIATION SEED     KNEE ARTHROSCOPY Right    KNEE SURGERY Left    TONSILLECTOMY     XRT  2008   Family History  Problem Relation Age of Onset   Liver disease Father    Cancer Father  pancreas ?   Cancer - Other Other        intestinal cancer   Social History   Socioeconomic History   Marital status: Married    Spouse name: Not on file   Number of children: 2   Years of education: Not on file   Highest education level: Not on file  Occupational History   Occupation: retired from [employer]  Tobacco Use   Smoking status: Some Days    Types: Cigars   Smokeless tobacco: Never   Tobacco comments:    2 cigars a month  Vaping Use   Vaping Use: Never used  Substance and Sexual Activity   Alcohol use: Yes    Alcohol/week: 2.0 standard drinks of alcohol    Types: 2 Cans of beer per week   Drug use: No   Sexual activity: Yes  Other Topics Concern   Not on file  Social History Narrative   Not on file   Social  Determinants of Health   Financial Resource Strain: Low Risk  (03/06/2022)   Overall Financial Resource Strain (CARDIA)    Difficulty of Paying Living Expenses: Not hard at all  Food Insecurity: No Food Insecurity (03/06/2022)   Hunger Vital Sign    Worried About Running Out of Food in the Last Year: Never true    Ran Out of Food in the Last Year: Never true  Transportation Needs: No Transportation Needs (03/06/2022)   PRAPARE - Administrator, Civil Service (Medical): No    Lack of Transportation (Non-Medical): No  Physical Activity: Sufficiently Active (03/06/2022)   Exercise Vital Sign    Days of Exercise per Week: 4 days    Minutes of Exercise per Session: 120 min  Stress: No Stress Concern Present (03/06/2022)   [employer] of Occupational Health - Occupational Stress Questionnaire    Feeling of Stress : Not at all  Social Connections: Moderately Integrated (03/06/2022)   Social Connection and Isolation Panel [NHANES]    Frequency of Communication with Friends and Family: More than three times a week    Frequency of Social Gatherings with Friends and Family: More than three times a week    Attends Religious Services: More than 4 times per year    Active Member of [employer] or Organizations: No    Attends Engineer, structural: Never    Marital Status: Married    Tobacco Counseling Ready to quit: Not Answered Counseling given: Not Answered Tobacco comments: 2 cigars a month   Clinical Intake:  Pre-visit preparation completed: Yes  Pain : No/denies pain Pain Score: 0-No pain     BMI - recorded: 27.31 Nutritional Status: BMI 25 -29 Overweight Nutritional Risks: None Diabetes: No  How often do you need to have someone help you when you read instructions, pamphlets, or other written materials from your doctor or pharmacy?: 1 - Never What is the last grade level you completed in school?: Retired from [employer]  Diabetic? No  Interpreter Needed?:  No  Information entered by :: Susie Cassette, LPN.   Activities of Daily Living    03/06/2022    9:30 AM  In your present state of health, do you have any difficulty performing the following activities:  Hearing? 0  Vision? 0  Difficulty concentrating or making decisions? 0  Walking or climbing stairs? 0  Dressing or bathing? 0  Doing errands, shopping? 0  Preparing Food and eating ? N  Using the Toilet? N  In the  past six months, have you accidently leaked urine? N  Do you have problems with loss of bowel control? N  Managing your Medications? N  Managing your Finances? N  Housekeeping or managing your Housekeeping? N    Patient Care Team: Plotnikov, Georgina Quint, MD as PCP - General Barron Alvine, MD (Inactive) (Urology) Fredrich Birks, OD as Referring Physician (Optometry) Liliane Shi Dorian Furnace, MD as Consulting Physician (Urology)  Indicate any recent Medical Services you may have received from other than [facility] providers in the past year (date may be approximate).     Assessment:   This is a routine wellness examination for [place].  Hearing/Vision screen Hearing Screening - Comments:: Denies hearing difficulties   Vision Screening - Comments:: Wears rx glasses - up to date with routine eye exams with Jon B. Scott, OD.   Dietary issues and exercise activities discussed: Current Exercise Habits: Home exercise routine;Structured exercise class, Type of exercise: walking;treadmill;stretching;strength training/weights;exercise ball;Other - see comments (golf), Time (Minutes): > 60, Frequency (Times/Week): 4, Weekly Exercise (Minutes/Week): 0, Intensity: Moderate, Exercise limited by: orthopedic condition(s)   Goals Addressed             This Visit's Progress    My goal for 2024 is to continue going to the gym and playing golf.        Depression Screen    03/06/2022    9:27 AM 12/08/2021    9:14 AM 11/25/2021   11:04 AM 11/20/2021    4:22 PM 02/21/2021    3:01  PM 12/27/2019    3:45 PM 05/29/2019    9:40 AM  PHQ 2/9 Scores  PHQ - 2 Score 0 0 0 0 0 0 0  PHQ- 9 Score  0  0       Fall Risk    03/06/2022    9:30 AM 12/08/2021    9:14 AM 11/25/2021   11:04 AM 11/20/2021    4:22 PM 02/21/2021    3:03 PM  Fall Risk   Falls in the past year? 0 0 0 0 0  Number falls in past yr: 0 0 0 0 0  Injury with Fall? 0 0 0 0 0  Risk for fall due to : No Fall Risks No Fall Risks No Fall Risks No Fall Risks Impaired vision  Follow up Falls prevention discussed Falls evaluation completed Falls evaluation completed Falls evaluation completed Falls prevention discussed    FALL RISK PREVENTION PERTAINING TO THE HOME:  Any stairs in or around the home? No  If so, are there any without handrails? No  Home free of loose throw rugs in walkways, pet beds, electrical cords, etc? Yes  Adequate lighting in your home to reduce risk of falls? Yes   ASSISTIVE DEVICES UTILIZED TO PREVENT FALLS:  Life alert? Yes  bracelet Use of a cane, walker or w/c? No  Grab bars in the bathroom? Yes  Shower chair or bench in shower? Yes  Elevated toilet seat or a handicapped toilet? Yes   TIMED UP AND GO:  Was the test performed? Yes .  Length of time to ambulate 10 feet: 8 sec.   Gait steady and fast without use of assistive device  Cognitive Function:        03/06/2022    9:31 AM 02/21/2021    3:05 PM  6CIT Screen  What Year? 0 points 0 points  What month? 0 points 0 points  What time? 0 points 0 points  Count back from 20 0  points 0 points  Months in reverse 0 points 0 points  Repeat phrase 0 points 0 points  Total Score 0 points 0 points    Immunizations Immunization History  Administered Date(s) Administered   Fluad Quad(high Dose 65+) 10/05/2018, 10/27/2019, 10/16/2020, 10/24/2021   Influenza Split 10/08/2011   Influenza Whole 11/18/2006, 10/31/2007, 09/22/2010   Influenza, High Dose Seasonal PF 11/07/2015, 11/12/2016, 11/15/2017   Influenza,inj,Quad PF,6+  Mos 10/14/2012, 10/20/2013, 10/22/2014   PFIZER(Purple Top)SARS-COV-2 Vaccination 02/06/2019, 02/27/2019, 11/11/2019   Pneumococcal Conjugate-13 04/22/2015   Pneumococcal Polysaccharide-23 08/22/2009, 05/30/2020   Td 09/13/2008   Tdap 09/28/2018   Zoster Recombinat (Shingrix) 03/11/2018, 07/18/2018   Zoster, Live 10/18/2009    TDAP status: Up to date  Flu Vaccine status: Up to date  Pneumococcal vaccine status: Up to date  Covid-19 vaccine status: Completed vaccines  Qualifies for Shingles Vaccine? Yes   Zostavax completed Yes   Shingrix Completed?: Yes  Screening Tests Health Maintenance  Topic Date Due   COVID-19 Vaccine (4 - 2023-24 season) 09/12/2021   Medicare Annual Wellness (AWV)  03/07/2023   DTaP/Tdap/Td (3 - Td or Tdap) 09/27/2028   Pneumonia Vaccine 31+ Years old  Completed   INFLUENZA VACCINE  Completed   Zoster Vaccines- Shingrix  Completed   HPV VACCINES  Aged Out    Health Maintenance  Health Maintenance Due  Topic Date Due   COVID-19 Vaccine (4 - 2023-24 season) 09/12/2021    Colorectal cancer screening: No longer required.   Lung Cancer Screening: (Low Dose CT Chest recommended if Age 40-80 years, 30 pack-year currently smoking OR have quit w/in 15years.) does not qualify.   Lung Cancer Screening Referral: no  Additional Screening:  Hepatitis C Screening: does not qualify; Completed no  Vision Screening: Recommended annual ophthalmology exams for early detection of glaucoma and other disorders of the eye. Is the patient up to date with their annual eye exam?  Yes  Who is the provider or what is the name of the office in which the patient attends annual eye exams? Theodoro Clock Scott, OD. If pt is not established with a provider, would they like to be referred to a provider to establish care? No .   Dental Screening: Recommended annual dental exams for proper oral hygiene  Community Resource Referral / Chronic Care Management: CRR required this  visit?  No   CCM required this visit?  No      Plan:     I have personally reviewed and noted the following in the patient's chart:   Medical and social history Use of alcohol, tobacco or illicit drugs  Current medications and supplements including opioid prescriptions. Patient is not currently taking opioid prescriptions. Functional ability and status Nutritional status Physical activity Advanced directives List of other physicians Hospitalizations, surgeries, and ER visits in previous 12 months Vitals Screenings to include cognitive, depression, and falls Referrals and appointments  In addition, I have reviewed and discussed with patient certain preventive protocols, quality metrics, and best practice recommendations. A written personalized care plan for preventive services as well as general preventive health recommendations were provided to patient.     Mickeal Needy, LPN   1/61/0960   Nurse Notes:  Normal cognitive status assessed by direct observation by this Nurse Health Advisor. No abnormalities found.    Medical screening examination/treatment/procedure(s) were performed by non-physician practitioner and as supervising physician I was immediately available for consultation/collaboration.  I agree with above. Jacinta Shoe, MD

## 2022-03-09 ENCOUNTER — Ambulatory Visit (INDEPENDENT_AMBULATORY_CARE_PROVIDER_SITE_OTHER): Payer: Medicare HMO | Admitting: Internal Medicine

## 2022-03-09 ENCOUNTER — Encounter: Payer: Self-pay | Admitting: Internal Medicine

## 2022-03-09 VITALS — BP 118/80 | HR 60 | Temp 98.6°F | Ht 67.0 in | Wt 171.0 lb

## 2022-03-09 DIAGNOSIS — G8929 Other chronic pain: Secondary | ICD-10-CM | POA: Diagnosis not present

## 2022-03-09 DIAGNOSIS — M8588 Other specified disorders of bone density and structure, other site: Secondary | ICD-10-CM | POA: Diagnosis not present

## 2022-03-09 DIAGNOSIS — M545 Low back pain, unspecified: Secondary | ICD-10-CM

## 2022-03-09 NOTE — Progress Notes (Signed)
Subjective:  Patient ID: Jeremy [name], male    DOB: Feb 15, 1940  Age: 82 y.o. MRN: [id number]  CC: Follow-up (3 MONTH F/U)   HPI Jeremy [name] presents for LBP, OA  Outpatient Medications Prior to Visit  Medication Sig Dispense Refill   azelastine (ASTELIN) 0.1 % nasal spray Place 2 sprays into both nostrils 2 (two) times daily. Use in each nostril as directed 30 mL 11   Cholecalciferol (VITAMIN D3) 50 MCG (2000 UT) capsule Take 1 capsule (2,000 Units total) by mouth daily. 100 capsule 3   meloxicam (MOBIC) 15 MG tablet Take 1 tablet (15 mg total) by mouth daily. (Patient not taking: Reported on 03/06/2022) 30 tablet 0   methocarbamol (ROBAXIN) 500 MG tablet Take 1 tablet (500 mg total) by mouth 2 (two) times daily as needed for muscle spasms. (Patient not taking: Reported on 03/06/2022) 20 tablet 0   mometasone (NASONEX) 50 MCG/ACT nasal spray Place 2 sprays into the nose daily. (Patient not taking: Reported on 03/06/2022) 1 each 11   montelukast (SINGULAIR) 10 MG tablet Take 1 tablet (10 mg total) by mouth daily. (Patient not taking: Reported on 03/06/2022) 30 tablet 5   RYALTRIS 665-25 MCG/ACT SUSP Place 2 sprays into the nose 2 (two) times daily as needed. (Patient not taking: Reported on 03/06/2022) 29 g 5   traMADol (ULTRAM) 50 MG tablet  (Patient not taking: Reported on 03/06/2022)     trimethoprim-polymyxin b (POLYTRIM) ophthalmic solution Place 1 drop into the right eye every 6 (six) hours. (Patient not taking: Reported on 03/06/2022)     No facility-administered medications prior to visit.    ROS: Review of Systems  Constitutional:  Negative for appetite change, fatigue and unexpected weight change.  HENT:  Negative for congestion, nosebleeds, sneezing, sore throat and trouble swallowing.   Eyes:  Negative for itching and visual disturbance.  Respiratory:  Negative for cough.   Cardiovascular:  Negative for chest pain, palpitations and leg swelling.  Gastrointestinal:   Negative for abdominal distention, blood in stool, diarrhea and nausea.  Genitourinary:  Negative for frequency and hematuria.  Musculoskeletal:  Positive for back pain. Negative for gait problem, joint swelling and neck pain.  Skin:  Negative for rash.  Neurological:  Negative for dizziness, tremors, speech difficulty and weakness.  Psychiatric/Behavioral:  Negative for agitation, dysphoric mood and sleep disturbance. The patient is not nervous/anxious.     Objective:  BP 118/80 (BP Location: Right Arm, Patient Position: Sitting, Cuff Size: Large)   Pulse 60   Temp 98.6 F (37 C) (Oral)   Ht '5\' 7"'$  (1.702 m)   Wt 171 lb (77.6 kg)   SpO2 99%   BMI 26.78 kg/m   BP Readings from Last 3 Encounters:  03/09/22 118/80  03/06/22 122/60  03/02/22 120/78    Wt Readings from Last 3 Encounters:  03/09/22 171 lb (77.6 kg)  03/06/22 174 lb 6.4 oz (79.1 kg)  03/02/22 175 lb (79.4 kg)    Physical Exam Constitutional:      General: He is not in acute distress.    Appearance: Normal appearance. He is well-developed.     Comments: NAD  Eyes:     Conjunctiva/sclera: Conjunctivae normal.     Pupils: Pupils are equal, round, and reactive to light.  Neck:     Thyroid: No thyromegaly.     Vascular: No JVD.  Cardiovascular:     Rate and Rhythm: Normal rate and regular rhythm.     Heart  sounds: Normal heart sounds. No murmur heard.    No friction rub. No gallop.  Pulmonary:     Effort: Pulmonary effort is normal. No respiratory distress.     Breath sounds: Normal breath sounds. No wheezing or rales.  Chest:     Chest wall: No tenderness.  Abdominal:     General: Bowel sounds are normal. There is no distension.     Palpations: Abdomen is soft. There is no mass.     Tenderness: There is no abdominal tenderness. There is no guarding or rebound.  Musculoskeletal:        General: No tenderness. Normal range of motion.     Cervical back: Normal range of motion.  Lymphadenopathy:      Cervical: No cervical adenopathy.  Skin:    General: Skin is warm and dry.     Findings: No rash.  Neurological:     Mental Status: He is alert and oriented to person, place, and time.     Cranial Nerves: No cranial nerve deficit.     Motor: No abnormal muscle tone.     Coordination: Coordination normal.     Gait: Gait normal.     Deep Tendon Reflexes: Reflexes are normal and symmetric.  Psychiatric:        Behavior: Behavior normal.        Thought Content: Thought content normal.        Judgment: Judgment normal.     Lab Results  Component Value Date   WBC 5.4 12/03/2021   HGB 14.3 12/03/2021   HCT 41.9 12/03/2021   PLT 198.0 12/03/2021   GLUCOSE 95 12/03/2021   CHOL 134 12/03/2021   TRIG 42.0 12/03/2021   HDL 43.50 12/03/2021   LDLCALC 82 12/03/2021   ALT 19 12/03/2021   AST 21 12/03/2021   NA 142 12/03/2021   K 4.1 12/03/2021   CL 108 12/03/2021   CREATININE 0.79 12/03/2021   BUN 20 12/03/2021   CO2 29 12/03/2021   TSH 2.49 12/03/2021   PSA 0.07 (L) 05/30/2020   INR 1.0 ratio 03/27/2008    DG Bone Density  Result Date: 12/26/2021 Table formatting from the original result was not included. Date of study: 12/23/2021 Exam: DUAL X-RAY ABSORPTIOMETRY (DXA) FOR BONE MINERAL DENSITY (BMD) Instrument: [employer] Requesting Provider: PCP Indication: follow up for low BMD Comparison: 2010 Clinical data: Pt is a 82 y.o. male with previous history of fracture. Results:  Lumbar spine L1-L4 (L2,L3) Femoral neck (FN) 33% distal radius T-score +0.1 RFN: -1.3 LFN: -0.8 +0.6 Change in BMD from previous DXA test (%) Up 11.0%* Down 9.2%* n/a (*) statistically significant Assessment: By the [facility] Criteria for diagnosis based on bone density, this patient has Low Bone Density FRAX 10-year fracture risk calculator: 11.3 % for any major fracture and 3.9 % for hip fracture. Pharmacologic therapy is recommended if 10 year fracture risk is >20% for any major osteoporotic fracture or >3%  for hip fracture.  L2,L3 vertebrae had to be excluded from analysis due to DJD Comments: the technical quality of the study is good. WHO criteria for diagnosis of osteoporosis in postmenopausal women and in men 22 y/o or older: - normal: T-score -1.0 to + 1.0 - osteopenia/low bone density: T-score between -2.5 and -1.0 - osteoporosis: T-score below -2.5 - severe osteoporosis: T-score below -2.5 with history of fragility fracture Note: although not part of the WHO classification, the presence of a fragility fracture, regardless of the T-score, should be  considered diagnostic of osteoporosis, provided other causes for the fracture have been excluded. RECOMMENDATION: 1. All patients should optimize calcium and vitamin D intake. 2. Consider FDA-approved medical therapies in postmenopausal women and men aged 87 years and older, based on the following: a. A hip or vertebral(clinical or morphometric) fracture. b. T-Score of  -2.5 or less at the femoral neck , total hip or spine after appropriate evaluation to exclude secondary causes c. Low bone mass (T-score between -1.0 and -2.5 at the femoral neck or spine) and a 10 year probability of a hip fracture >3% or a 10 year probability of major osteoporosis-related fracture > 20% based on the [place]-adapted WHO algorithm d. Clinical judgement and/or patient preferences may indicate treatment for people with 10-year fracture probabilities above or below these levels Follow up BMD is recommended: 2 years. Interpreted by : Mack Guise, MD [facility]    Assessment & Plan:   Problem List Items Addressed This Visit       Musculoskeletal and Integument   Osteopenia - Primary    Start Vit D3+K2        Other   Chronic low back pain    Tylenol prn         No orders of the defined types were placed in this encounter.     Follow-up: Return in about 6 months (around 09/07/2022) for Wellness Exam.  Walker Kehr, MD

## 2022-03-09 NOTE — Patient Instructions (Signed)
Start Vit D3+K2

## 2022-03-09 NOTE — Assessment & Plan Note (Signed)
Tylenol prn 

## 2022-03-09 NOTE — Assessment & Plan Note (Signed)
Start Vit D3+K2

## 2022-03-17 DIAGNOSIS — R69 Illness, unspecified: Secondary | ICD-10-CM | POA: Diagnosis not present

## 2022-03-23 DIAGNOSIS — R69 Illness, unspecified: Secondary | ICD-10-CM | POA: Diagnosis not present

## 2022-04-02 DIAGNOSIS — R69 Illness, unspecified: Secondary | ICD-10-CM | POA: Diagnosis not present

## 2022-04-09 DIAGNOSIS — L570 Actinic keratosis: Secondary | ICD-10-CM | POA: Diagnosis not present

## 2022-04-09 DIAGNOSIS — L578 Other skin changes due to chronic exposure to nonionizing radiation: Secondary | ICD-10-CM | POA: Diagnosis not present

## 2022-04-27 DIAGNOSIS — R69 Illness, unspecified: Secondary | ICD-10-CM | POA: Diagnosis not present

## 2022-05-07 DIAGNOSIS — R69 Illness, unspecified: Secondary | ICD-10-CM | POA: Diagnosis not present

## 2022-05-14 DIAGNOSIS — R69 Illness, unspecified: Secondary | ICD-10-CM | POA: Diagnosis not present

## 2022-05-19 DIAGNOSIS — R69 Illness, unspecified: Secondary | ICD-10-CM | POA: Diagnosis not present

## 2022-05-19 NOTE — Progress Notes (Deleted)
[name] [address] [place] [address] Phone: [phone] Subjective:    I'm seeing this patient by the request  of:  Plotnikov, Georgina Quint, MD  CC: Left knee pain  [id number]  04/10/2021 Steroid injection given today, we will get approval for viscosupplementation if needed. Patient has responded extremely well to today's. We discussed icing regimen and home exercises. Follow-up again in 8 weeks. Chronic problem with exacerbation.   Updated 05/20/2022 Jeremy [name] is a 82 y.o. male coming in with complaint of left knee pain.  Has not been seen for quite some time was seen greater than a year ago and given a steroid injection.       Past Medical History:  Diagnosis Date   AK (actinic keratosis)    Allergic rhinitis    History of colonic polyps    Dr. Christella Hartigan- Due 2018   History of skin cancer    Osteopenia    Prostate cancer (HCC)    hx of s/p XRT, seeds Dr. Isabel Caprice   Past Surgical History:  Procedure Laterality Date   INSERTION PROSTATE RADIATION SEED     KNEE ARTHROSCOPY Right    KNEE SURGERY Left    TONSILLECTOMY     XRT  2008   Social History   Socioeconomic History   Marital status: Married    Spouse name: Not on file   Number of children: 2   Years of education: Not on file   Highest education level: Not on file  Occupational History   Occupation: retired from Air[employer]  Tobacco Use   Smoking status: Some Days    Types: Cigars   Smokeless tobacco: Never   Tobacco comments:    2 cigars a month  Vaping Use   Vaping Use: Never used  Substance and Sexual Activity   Alcohol use: Yes    Alcohol/week: 2.0 standard drinks of alcohol    Types: 2 Cans of beer per week   Drug use: No   Sexual activity: Yes  Other Topics Concern   Not on file  Social History Narrative   Not on file   Social Determinants of Health   Financial Resource Strain: Low Risk  (03/06/2022)   Overall Financial Resource Strain (CARDIA)     Difficulty of Paying Living Expenses: Not hard at all  Food Insecurity: No Food Insecurity (03/06/2022)   Hunger Vital Sign    Worried About Running Out of Food in the Last Year: Never true    Ran Out of Food in the Last Year: Never true  Transportation Needs: No Transportation Needs (03/06/2022)   PRAPARE - Administrator, Civil Service (Medical): No    Lack of Transportation (Non-Medical): No  Physical Activity: Sufficiently Active (03/06/2022)   Exercise Vital Sign    Days of Exercise per Week: 4 days    Minutes of Exercise per Session: 120 min  Stress: No Stress Concern Present (03/06/2022)   [employer] of Occupational Health - Occupational Stress Questionnaire    Feeling of Stress : Not at all  Social Connections: Moderately Integrated (03/06/2022)   Social Connection and Isolation Panel [NHANES]    Frequency of Communication with Friends and Family: More than three times a week    Frequency of Social Gatherings with Friends and Family: More than three times a week    Attends Religious Services: More than 4 times per year    Active Member of Clubs or Organizations: No  Attends Banker Meetings: Never    Marital Status: Married   Allergies  Allergen Reactions   Aspirin     REACTION: gi   Family History  Problem Relation Age of Onset   Liver disease Father    Cancer Father        pancreas ?   Cancer - Other Other        intestinal cancer      Current Outpatient Medications (Respiratory):    azelastine (ASTELIN) 0.1 % nasal spray, Place 2 sprays into both nostrils 2 (two) times daily. Use in each nostril as directed    Current Outpatient Medications (Other):    Cholecalciferol (VITAMIN D3) 50 MCG (2000 UT) capsule, Take 1 capsule (2,000 Units total) by mouth daily.   Reviewed prior external information including notes and imaging from  primary care provider As well as notes that were available from care everywhere and other healthcare  systems.  Past medical history, social, surgical and family history all reviewed in electronic medical record.  No pertanent information unless stated regarding to the chief complaint.   Review of Systems:  No headache, visual changes, nausea, vomiting, diarrhea, constipation, dizziness, abdominal pain, skin rash, fevers, chills, night sweats, weight loss, swollen lymph nodes, body aches, joint swelling, chest pain, shortness of breath, mood changes. POSITIVE muscle aches  Objective  There were no vitals taken for this visit.   General: No apparent distress alert and oriented x3 mood and affect normal, dressed appropriately.  HEENT: Pupils equal, extraocular movements intact  Respiratory: Patient's speak in full sentences and does not appear short of breath  Cardiovascular: No lower extremity edema, non tender, no erythema      Impression and Recommendations:    The above documentation has been reviewed and is accurate and complete Judi Saa, DO

## 2022-05-20 ENCOUNTER — Encounter: Payer: Self-pay | Admitting: Family Medicine

## 2022-05-20 ENCOUNTER — Ambulatory Visit: Payer: Medicare HMO | Admitting: Family Medicine

## 2022-05-20 VITALS — BP 130/78 | HR 89 | Ht 67.0 in | Wt 174.0 lb

## 2022-05-20 DIAGNOSIS — M1712 Unilateral primary osteoarthritis, left knee: Secondary | ICD-10-CM

## 2022-05-20 NOTE — Assessment & Plan Note (Signed)
Worsening pain again at this time.  Discussed icing regimen and home exercises.  Instability noted.  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  Injection given to help with some of the pain at this moment.  We discussed the long-term other possibilities is viscosupplementation.  Increase activity slowly otherwise.  Follow-up with me again in 2 to 3 months.

## 2022-05-20 NOTE — Progress Notes (Signed)
[name] [address] [place] [address] Phone: [phone] Subjective:   Jeremy [name], am serving as a scribe for Dr. Antoine [name].  I'm seeing this patient by the request  of:  Jeremy [name], Jeremy Quint, MD  CC: Left knee pain   [id number]  Jeremy [name] is a 82 y.o. male coming in with complaint of L knee pain. Last seen in March 2023. Patient states that his had good and bad days. Pain will transfer from lateral to medial aspect. Patient goes to the gym and is able to walk but he pushes through the pain. No pain on the elliptical. Painful to get in and out of the car. Uses voltaren gel.      Past Medical History:  Diagnosis Date   AK (actinic keratosis)    Allergic rhinitis    History of colonic polyps    Dr. Christella [name]- Due 2018   History of skin cancer    Osteopenia    Prostate cancer (HCC)    hx of s/p XRT, seeds Dr. Isabel [name]   Past Surgical History:  Procedure Laterality Date   INSERTION PROSTATE RADIATION SEED     KNEE ARTHROSCOPY Right    KNEE SURGERY Left    TONSILLECTOMY     XRT  2008   Social History   Socioeconomic History   Marital status: Married    Spouse name: Not on file   Number of children: 2   Years of education: Not on file   Highest education level: Not on file  Occupational History   Occupation: retired from Airline pilot  Tobacco Use   Smoking status: Some Days    Types: Cigars   Smokeless tobacco: Never   Tobacco comments:    2 cigars a month  Vaping Use   Vaping Use: Never used  Substance and Sexual Activity   Alcohol use: Yes    Alcohol/week: 2.0 standard drinks of alcohol    Types: 2 Cans of beer per week   Drug use: No   Sexual activity: Yes  Other Topics Concern   Not on file  Social History Narrative   Not on file   Social Determinants of Health   Financial Resource Strain: Low Risk  (03/06/2022)   Overall Financial Resource Strain (CARDIA)    Difficulty of Paying Living Expenses:  Not hard at all  Food Insecurity: No Food Insecurity (03/06/2022)   Hunger Vital Sign    Worried About Running Out of Food in the Last Year: Never true    Ran Out of Food in the Last Year: Never true  Transportation Needs: No Transportation Needs (03/06/2022)   PRAPARE - Administrator, Civil Service (Medical): No    Lack of Transportation (Non-Medical): No  Physical Activity: Sufficiently Active (03/06/2022)   Exercise Vital Sign    Days of Exercise per Week: 4 days    Minutes of Exercise per Session: 120 min  Stress: No Stress Concern Present (03/06/2022)   [facility]remy [name] of Occupational Health - Occupational Stress Questionnaire    Feeling of Stress : Not at all  Social Connections: Moderately Integrated (03/06/2022)   Social Connection and Isolation Panel [NHANES]    Frequency of Communication with Friends and Family: More than three times a week    Frequency of Social Gatherings with Friends and Family: More than three times a week    Attends Religious Services: More than 4 times per year    Active Member of  Clubs or Organizations: No    Attends Banker Meetings: Never    Marital Status: Married   Allergies  Allergen Reactions   Aspirin     REACTION: gi   Family History  Problem Relation Age of Onset   Liver disease Father    Cancer Father        pancreas ?   Cancer - Other Other        intestinal cancer      Current Outpatient Medications (Respiratory):    azelastine (ASTELIN) 0.1 % nasal spray, Place 2 sprays into both nostrils 2 (two) times daily. Use in each nostril as directed    Current Outpatient Medications (Other):    Cholecalciferol (VITAMIN D3) 50 MCG (2000 UT) capsule, Take 1 capsule (2,000 Units total) by mouth daily.   Reviewed prior external information including notes and imaging from  primary care provider As well as notes that were available from care everywhere and other healthcare systems.  Past medical history,  social, surgical and family history all reviewed in electronic medical record.  No pertanent information unless stated regarding to the chief complaint.   Review of Systems:  No headache, visual changes, nausea, vomiting, diarrhea, constipation, dizziness, abdominal pain, skin rash, fevers, chills, night sweats, weight loss, swollen lymph nodes, joint swelling, chest pain, shortness of breath, mood changes. POSITIVE muscle aches, body aches   Objective  Blood pressure 130/78, pulse 89, height 5\' 7"  (1.702 m), weight 174 lb (78.9 kg), SpO2 98 %.   General: No apparent distress alert and oriented x3 mood and affect normal, dressed appropriately.  HEENT: Pupils equal, extraocular movements intact  Respiratory: Patient's speak in full sentences and does not appear short of breath  Patient does have some instability of the knee noted on the left.  Does have an effusion noted.  Tender to palpation diffusely and lacks last 10 degrees of flexion.  After informed written and verbal consent, patient was seated on exam table. Left knee was prepped with alcohol swab and utilizing anterolateral approach, patient's left knee space was injected with 4:1  marcaine 0.5%: Kenalog 40mg /dL. Patient tolerated the procedure well without immediate complications.    Impression and Recommendations:    The above documentation has been reviewed and is accurate and complete Judi Saa, DO

## 2022-05-20 NOTE — Patient Instructions (Addendum)
Injection in knee today Good to see you! See you again in 6-8 weeks 

## 2022-06-03 DIAGNOSIS — R69 Illness, unspecified: Secondary | ICD-10-CM | POA: Diagnosis not present

## 2022-06-17 DIAGNOSIS — R69 Illness, unspecified: Secondary | ICD-10-CM | POA: Diagnosis not present

## 2022-07-14 DIAGNOSIS — R69 Illness, unspecified: Secondary | ICD-10-CM | POA: Diagnosis not present

## 2022-07-21 DIAGNOSIS — R69 Illness, unspecified: Secondary | ICD-10-CM | POA: Diagnosis not present

## 2022-07-23 DIAGNOSIS — R69 Illness, unspecified: Secondary | ICD-10-CM | POA: Diagnosis not present

## 2022-07-27 ENCOUNTER — Other Ambulatory Visit: Payer: Self-pay

## 2022-07-27 ENCOUNTER — Ambulatory Visit: Payer: Medicare HMO | Admitting: Family Medicine

## 2022-07-27 ENCOUNTER — Emergency Department (HOSPITAL_BASED_OUTPATIENT_CLINIC_OR_DEPARTMENT_OTHER)
Admission: EM | Admit: 2022-07-27 | Discharge: 2022-07-27 | Disposition: A | Discharge status: Home / Self Care | Payer: Medicare HMO | Source: Home / Self Care | Attending: Emergency Medicine | Admitting: Emergency Medicine

## 2022-07-27 ENCOUNTER — Encounter (HOSPITAL_BASED_OUTPATIENT_CLINIC_OR_DEPARTMENT_OTHER): Payer: Self-pay

## 2022-07-27 DIAGNOSIS — T7840XA Allergy, unspecified, initial encounter: Secondary | ICD-10-CM | POA: Diagnosis not present

## 2022-07-27 DIAGNOSIS — R2232 Localized swelling, mass and lump, left upper limb: Secondary | ICD-10-CM | POA: Diagnosis not present

## 2022-07-27 DIAGNOSIS — Z91038 Other insect allergy status: Secondary | ICD-10-CM

## 2022-07-27 DIAGNOSIS — T63421A Toxic effect of venom of ants, accidental (unintentional), initial encounter: Secondary | ICD-10-CM | POA: Diagnosis not present

## 2022-07-27 MED ORDER — CEPHALEXIN 500 MG PO CAPS: ORAL_CAPSULE | ORAL | 0 refills | Status: DC

## 2022-07-27 NOTE — Discharge Instructions (Signed)
I think most likely you are having an allergic reaction to the bites from the ants.  This typically improves with antihistamines.  It is less likely to be infectious but could get infectious due to scratching.  I have prescribed you an antibiotic if you would like.  Please follow-up with family doctor in the office.  Please return for rapid spreading redness or if you develop a fever.  Also return for wheezing throwing up or diarrhea or if you feel you are going to pass out.

## 2022-07-27 NOTE — ED Provider Notes (Signed)
[facility] Provider Note   CSN: [id number] Arrival date & time: 07/27/22  1018     History  Chief Complaint  Patient presents with   Arm Swelling    Jeremy [name] is a 82 y.o. male.  82 yo M with a chief complaints of redness and itching to the left hand.  This has been going on since yesterday.  He picked up something that was in a drain pipe and then realized it was covered with ants.  He had something happen to him before and required antibiotic therapy which cleared up.  He denies any wheezing denies throwing up or diarrhea denies fevers.        Home Medications Prior to Admission medications   Medication Sig Start Date End Date Taking? Authorizing Provider  cephALEXin (KEFLEX) 500 MG capsule 2 caps po bid x 7 days 07/27/22  Yes Melene Plan, DO  azelastine (ASTELIN) 0.1 % nasal spray Place 2 sprays into both nostrils 2 (two) times daily. Use in each nostril as directed 04/11/21   Ambs, Norvel Richards, FNP  Cholecalciferol (VITAMIN D3) 50 MCG (2000 UT) capsule Take 1 capsule (2,000 Units total) by mouth daily. 11/30/19   Plotnikov, Georgina Quint, MD      Allergies    Aspirin    Review of Systems   Review of Systems  Physical Exam Updated Vital Signs BP (!) 157/85 (BP Location: Right Arm)   Pulse 75   Temp 97.9 F (36.6 C) (Oral)   Resp 18   Ht 5\' 7"  (1.702 m)   Wt 77.1 kg   SpO2 98%   BMI 26.63 kg/m  Physical Exam Vitals and nursing note reviewed.  Constitutional:      Appearance: He is well-developed.  HENT:     Head: Normocephalic and atraumatic.  Eyes:     Pupils: Pupils are equal, round, and reactive to light.  Neck:     Vascular: No JVD.  Cardiovascular:     Rate and Rhythm: Normal rate and regular rhythm.     Heart sounds: No murmur heard.    No friction rub. No gallop.  Pulmonary:     Effort: No respiratory distress.     Breath sounds: No wheezing.  Abdominal:     General: There is no distension.      Tenderness: There is no abdominal tenderness. There is no guarding or rebound.  Musculoskeletal:        General: Normal range of motion.     Cervical back: Normal range of motion and neck supple.     Comments: Erythema mostly to the dorsal aspect of the left hand and forearm.  There is some pustules along the dorsum of the hand.  Pulse motor and sensation intact.  Skin:    Coloration: Skin is not pale.     Findings: No rash.  Neurological:     Mental Status: He is alert and oriented to person, place, and time.  Psychiatric:        Behavior: Behavior normal.     ED Results / Procedures / Treatments   Labs (all labs ordered are listed, but only abnormal results are displayed) Labs Reviewed - No data to display  EKG None  Radiology No results found.  Procedures Procedures    Medications Ordered in ED Medications - No data to display  ED Course/ Medical Decision Making/ A&P  Medical Decision Making Risk Prescription drug management.   82 yo M with a chief complaints of itching and swelling and redness to the left hand.  This is after being bit by ants.  Most likely the patient is having a localized reaction to an insect bite or sting.  He is here for antibiotic therapy.  I discussed with him the limitations of antibiotics currently.  I encouraged him to try watchful waiting at home.  Offered steroids as well.  Will have him follow-up with his family doctor in the office.  10:33 AM:  I have discussed the diagnosis/risks/treatment options with the patient.  Evaluation and diagnostic testing in the emergency department does not suggest an emergent condition requiring admission or immediate intervention beyond what has been performed at this time.  They will follow up with PCP. We also discussed returning to the ED immediately if new or worsening sx occur. We discussed the sx which are most concerning (e.g., sudden worsening pain, fever, inability to  tolerate by mouth, anaphylaxis s/sx, rapid spreading redness) that necessitate immediate return. Medications administered to the patient during their visit and any new prescriptions provided to the patient are listed below.  Medications given during this visit Medications - No data to display   The patient appears reasonably screen and/or stabilized for discharge and I doubt any other medical condition or other [facility] requiring further screening, evaluation, or treatment in the ED at this time prior to discharge.          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

## 2022-07-27 NOTE — ED Triage Notes (Signed)
States was bit by small black ants yesterday, c/o worsening left hand/forearm redness and swelling since. Hx of cellulitis from mosquitos in past.

## 2022-07-30 DIAGNOSIS — R69 Illness, unspecified: Secondary | ICD-10-CM | POA: Diagnosis not present

## 2022-08-11 DIAGNOSIS — H10023 Other mucopurulent conjunctivitis, bilateral: Secondary | ICD-10-CM | POA: Diagnosis not present

## 2022-08-12 ENCOUNTER — Encounter (INDEPENDENT_AMBULATORY_CARE_PROVIDER_SITE_OTHER): Payer: Self-pay

## 2022-08-19 DIAGNOSIS — R69 Illness, unspecified: Secondary | ICD-10-CM | POA: Diagnosis not present

## 2022-08-25 DIAGNOSIS — R69 Illness, unspecified: Secondary | ICD-10-CM | POA: Diagnosis not present

## 2022-08-27 DIAGNOSIS — L578 Other skin changes due to chronic exposure to nonionizing radiation: Secondary | ICD-10-CM | POA: Diagnosis not present

## 2022-08-27 DIAGNOSIS — L570 Actinic keratosis: Secondary | ICD-10-CM | POA: Diagnosis not present

## 2022-08-27 DIAGNOSIS — D225 Melanocytic nevi of trunk: Secondary | ICD-10-CM | POA: Diagnosis not present

## 2022-08-27 DIAGNOSIS — L538 Other specified erythematous conditions: Secondary | ICD-10-CM | POA: Diagnosis not present

## 2022-08-27 DIAGNOSIS — L814 Other melanin hyperpigmentation: Secondary | ICD-10-CM | POA: Diagnosis not present

## 2022-08-27 DIAGNOSIS — L821 Other seborrheic keratosis: Secondary | ICD-10-CM | POA: Diagnosis not present

## 2022-08-27 DIAGNOSIS — Z08 Encounter for follow-up examination after completed treatment for malignant neoplasm: Secondary | ICD-10-CM | POA: Diagnosis not present

## 2022-08-27 DIAGNOSIS — L820 Inflamed seborrheic keratosis: Secondary | ICD-10-CM | POA: Diagnosis not present

## 2022-08-27 DIAGNOSIS — L728 Other follicular cysts of the skin and subcutaneous tissue: Secondary | ICD-10-CM | POA: Diagnosis not present

## 2022-08-27 DIAGNOSIS — Z85828 Personal history of other malignant neoplasm of skin: Secondary | ICD-10-CM | POA: Diagnosis not present

## 2022-09-07 ENCOUNTER — Ambulatory Visit (INDEPENDENT_AMBULATORY_CARE_PROVIDER_SITE_OTHER): Payer: Medicare HMO | Admitting: Internal Medicine

## 2022-09-07 ENCOUNTER — Encounter: Payer: Self-pay | Admitting: Internal Medicine

## 2022-09-07 VITALS — BP 130/80 | HR 88 | Temp 97.3°F | Ht 67.0 in | Wt 171.0 lb

## 2022-09-07 DIAGNOSIS — M545 Low back pain, unspecified: Secondary | ICD-10-CM

## 2022-09-07 DIAGNOSIS — M549 Dorsalgia, unspecified: Secondary | ICD-10-CM

## 2022-09-07 DIAGNOSIS — C61 Malignant neoplasm of prostate: Secondary | ICD-10-CM | POA: Diagnosis not present

## 2022-09-07 DIAGNOSIS — E785 Hyperlipidemia, unspecified: Secondary | ICD-10-CM | POA: Diagnosis not present

## 2022-09-07 DIAGNOSIS — F4321 Adjustment disorder with depressed mood: Secondary | ICD-10-CM | POA: Diagnosis not present

## 2022-09-07 DIAGNOSIS — G8929 Other chronic pain: Secondary | ICD-10-CM | POA: Diagnosis not present

## 2022-09-07 DIAGNOSIS — Z0000 Encounter for general adult medical examination without abnormal findings: Secondary | ICD-10-CM

## 2022-09-07 LAB — LIPID PANEL
Cholesterol: 157 mg/dL (ref 0–200)
HDL: 37.5 mg/dL — ABNORMAL LOW (ref >39.00)
LDL Cholesterol: 105 mg/dL — ABNORMAL HIGH (ref 0–99)
NonHDL: 119.17
Total CHOL/HDL Ratio: 4
Triglycerides: 72 mg/dL (ref 0.0–149.0)
VLDL: 14.4 mg/dL (ref 0.0–40.0)

## 2022-09-07 LAB — CBC WITH DIFFERENTIAL/PLATELET
Basophils Absolute: 0 10*3/uL (ref 0.0–0.1)
Basophils Relative: 0.7 % (ref 0.0–3.0)
Eosinophils Absolute: 0.1 10*3/uL (ref 0.0–0.7)
Eosinophils Relative: 2.4 % (ref 0.0–5.0)
HCT: 45 % (ref 39.0–52.0)
Hemoglobin: 15.2 g/dL (ref 13.0–17.0)
Lymphocytes Relative: 41.9 % (ref 12.0–46.0)
Lymphs Abs: 2 10*3/uL (ref 0.7–4.0)
MCHC: 33.7 g/dL (ref 30.0–36.0)
MCV: 101.7 fl — ABNORMAL HIGH (ref 78.0–100.0)
Monocytes Absolute: 0.5 10*3/uL (ref 0.1–1.0)
Monocytes Relative: 9.6 % (ref 3.0–12.0)
Neutro Abs: 2.2 10*3/uL (ref 1.4–7.7)
Neutrophils Relative %: 45.4 % (ref 43.0–77.0)
Platelets: 200 10*3/uL (ref 150.0–400.0)
RBC: 4.43 Mil/uL (ref 4.22–5.81)
RDW: 13.8 % (ref 11.5–15.5)
WBC: 4.8 10*3/uL (ref 4.0–10.5)

## 2022-09-07 LAB — URINALYSIS
Bilirubin Urine: NEGATIVE
Hgb urine dipstick: NEGATIVE
Ketones, ur: NEGATIVE
Leukocytes,Ua: NEGATIVE
Nitrite: NEGATIVE
Specific Gravity, Urine: 1.025 (ref 1.000–1.030)
Total Protein, Urine: NEGATIVE
Urine Glucose: NEGATIVE
Urobilinogen, UA: 1 (ref 0.0–1.0)
pH: 6.5 (ref 5.0–8.0)

## 2022-09-07 LAB — TSH: TSH: 1.58 u[IU]/mL (ref 0.35–5.50)

## 2022-09-07 LAB — COMPREHENSIVE METABOLIC PANEL
ALT: 18 U/L (ref 0–53)
AST: 18 U/L (ref 0–37)
Albumin: 3.9 g/dL (ref 3.5–5.2)
Alkaline Phosphatase: 69 U/L (ref 39–117)
BUN: 17 mg/dL (ref 6–23)
CO2: 27 mEq/L (ref 19–32)
Calcium: 9.1 mg/dL (ref 8.4–10.5)
Chloride: 105 mEq/L (ref 96–112)
Creatinine, Ser: 0.77 mg/dL (ref 0.40–1.50)
GFR: 83.47 mL/min (ref >60.00)
Glucose, Bld: 96 mg/dL (ref 70–99)
Potassium: 4.5 mEq/L (ref 3.5–5.1)
Sodium: 139 mEq/L (ref 135–145)
Total Bilirubin: 0.8 mg/dL (ref 0.2–1.2)
Total Protein: 6.5 g/dL (ref 6.0–8.3)

## 2022-09-07 LAB — PSA: PSA: 0.21 ng/mL (ref 0.10–4.00)

## 2022-09-07 NOTE — Assessment & Plan Note (Signed)
CT ca scoring nl "0" score, no CAD 12/19 On diet

## 2022-09-07 NOTE — Assessment & Plan Note (Signed)
Hx of prostate cancer 2011 S/p XRT, seeds - not seeing Urology Check PSA

## 2022-09-07 NOTE — Assessment & Plan Note (Signed)
We discussed age appropriate health related issues, including available/recomended screening tests and vaccinations. We discussed a need for adhering to healthy diet and exercise. Labs were ordered to be later reviewed . All questions were answered. CT ca scoring nl "0" score, no CAD 12/19 Colon 2018

## 2022-09-07 NOTE — Assessment & Plan Note (Signed)
DEXA scan

## 2022-09-07 NOTE — Assessment & Plan Note (Signed)
Wife died in June 2024 - was married for 60 years

## 2022-09-07 NOTE — Progress Notes (Addendum)
Subjective:  Patient ID: Jeremy [name], male    DOB: 01-Jul-1940  Age: 82 y.o. MRN: [id number]  CC: Annual Exam   HPI Jeremy [name] presents for HTN, LBP Well exam  Wife died in 06/20/22  Outpatient Medications Prior to Visit  Medication Sig Dispense Refill   azelastine (ASTELIN) 0.1 % nasal spray Place 2 sprays into both nostrils 2 (two) times daily. Use in each nostril as directed 30 mL 11   cephALEXin (KEFLEX) 500 MG capsule 2 caps po bid x 7 days 28 capsule 0   Cholecalciferol (VITAMIN D3) 50 MCG (2000 UT) capsule Take 1 capsule (2,000 Units total) by mouth daily. 100 capsule 3   No facility-administered medications prior to visit.    ROS: Review of Systems  Constitutional:  Negative for appetite change, fatigue and unexpected weight change.  HENT:  Negative for congestion, nosebleeds, sneezing, sore throat and trouble swallowing.   Eyes:  Negative for itching and visual disturbance.  Respiratory:  Negative for cough.   Cardiovascular:  Negative for chest pain, palpitations and leg swelling.  Gastrointestinal:  Negative for abdominal distention, blood in stool, diarrhea and nausea.  Genitourinary:  Negative for frequency and hematuria.  Musculoskeletal:  Negative for back pain, gait problem, joint swelling and neck pain.  Skin:  Negative for rash.  Neurological:  Negative for dizziness, tremors, speech difficulty and weakness.  Psychiatric/Behavioral:  Negative for agitation, dysphoric mood and sleep disturbance. The patient is not nervous/anxious.     Objective:  BP 130/80   Pulse 88   Temp (!) 97.3 F (36.3 C) (Temporal)   Ht 5\' 7"  (1.702 m)   Wt 171 lb (77.6 kg)   SpO2 99%   BMI 26.78 kg/m   BP Readings from Last 3 Encounters:  09/07/22 130/80  07/27/22 (!) 157/85  05/20/22 130/78    Wt Readings from Last 3 Encounters:  09/07/22 171 lb (77.6 kg)  07/27/22 170 lb (77.1 kg)  05/20/22 174 lb (78.9 kg)    Physical Exam Constitutional:       General: He is not in acute distress.    Appearance: He is well-developed.     Comments: NAD  Eyes:     Conjunctiva/sclera: Conjunctivae normal.     Pupils: Pupils are equal, round, and reactive to light.  Neck:     Thyroid: No thyromegaly.     Vascular: No JVD.  Cardiovascular:     Rate and Rhythm: Normal rate and regular rhythm.     Heart sounds: Normal heart sounds. No murmur heard.    No friction rub. No gallop.  Pulmonary:     Effort: Pulmonary effort is normal. No respiratory distress.     Breath sounds: Normal breath sounds. No wheezing or rales.  Chest:     Chest wall: No tenderness.  Abdominal:     General: Bowel sounds are normal. There is no distension.     Palpations: Abdomen is soft. There is no mass.     Tenderness: There is no abdominal tenderness. There is no guarding or rebound.  Musculoskeletal:        General: No tenderness. Normal range of motion.     Cervical back: Normal range of motion.  Lymphadenopathy:     Cervical: No cervical adenopathy.  Skin:    General: Skin is warm and dry.     Findings: No rash.  Neurological:     Mental Status: He is alert and oriented to person, place, and  time.     Cranial Nerves: No cranial nerve deficit.     Motor: No abnormal muscle tone.     Coordination: Coordination normal.     Gait: Gait normal.     Deep Tendon Reflexes: Reflexes are normal and symmetric.  Psychiatric:        Behavior: Behavior normal.        Thought Content: Thought content normal.        Judgment: Judgment normal.     Lab Results  Component Value Date   WBC 5.4 12/03/2021   HGB 14.3 12/03/2021   HCT 41.9 12/03/2021   PLT 198.0 12/03/2021   GLUCOSE 95 12/03/2021   CHOL 134 12/03/2021   TRIG 42.0 12/03/2021   HDL 43.50 12/03/2021   LDLCALC 82 12/03/2021   ALT 19 12/03/2021   AST 21 12/03/2021   NA 142 12/03/2021   K 4.1 12/03/2021   CL 108 12/03/2021   CREATININE 0.79 12/03/2021   BUN 20 12/03/2021   CO2 29 12/03/2021   TSH 2.49  12/03/2021   PSA 0.07 (L) 05/30/2020   INR 1.0 ratio 03/27/2008    No results found.  Assessment & Plan:   Problem List Items Addressed This Visit     Chronic low back pain    OA - some "back fatigue" present      Dyslipidemia    CT ca scoring nl "0" score, no CAD 12/19 On diet      Relevant Orders   TSH   Lipid panel   Grief     Wife died in 06/21/22- was married for 60 years      Musculoskeletal back pain     DEXA scan      Prostate cancer (HCC)    Hx of prostate cancer 2011 S/p XRT, seeds - not seeing Urology Check PSA      Relevant Orders   PSA   Well adult exam - Primary    We discussed age appropriate health related issues, including available/recomended screening tests and vaccinations. We discussed a need for adhering to healthy diet and exercise. Labs were ordered to be later reviewed . All questions were answered. CT ca scoring nl "0" score, no CAD 12/19 Colon 2018      Relevant Orders   TSH   Urinalysis   CBC with Differential/Platelet   Lipid panel   PSA   Comprehensive metabolic panel      No orders of the defined types were placed in this encounter.     Follow-up: Return in about 6 months (around 03/10/2023) for a follow-up visit.  Sonda Primes, MD

## 2022-09-07 NOTE — Assessment & Plan Note (Signed)
OA - some "back fatigue" present

## 2022-09-08 DIAGNOSIS — R69 Illness, unspecified: Secondary | ICD-10-CM | POA: Diagnosis not present

## 2022-09-08 NOTE — Addendum Note (Signed)
Addended by: Tresa Garter on: 09/08/2022 12:09 AM   Modules accepted: Orders, Level of Service

## 2022-10-01 DIAGNOSIS — R69 Illness, unspecified: Secondary | ICD-10-CM | POA: Diagnosis not present

## 2022-10-02 DIAGNOSIS — H02811 Retained foreign body in right upper eyelid: Secondary | ICD-10-CM | POA: Diagnosis not present

## 2022-10-05 ENCOUNTER — Encounter: Payer: Self-pay | Admitting: Family Medicine

## 2022-10-05 ENCOUNTER — Ambulatory Visit (INDEPENDENT_AMBULATORY_CARE_PROVIDER_SITE_OTHER): Payer: Medicare HMO | Admitting: Family Medicine

## 2022-10-05 ENCOUNTER — Other Ambulatory Visit: Payer: Self-pay

## 2022-10-05 VITALS — BP 130/70 | HR 73 | Temp 97.6°F | Resp 12 | Wt 177.7 lb

## 2022-10-05 DIAGNOSIS — R69 Illness, unspecified: Secondary | ICD-10-CM | POA: Diagnosis not present

## 2022-10-05 DIAGNOSIS — J301 Allergic rhinitis due to pollen: Secondary | ICD-10-CM

## 2022-10-05 MED ORDER — MONTELUKAST SODIUM 10 MG PO TABS: 10.0000 mg | ORAL_TABLET | Freq: Every day | ORAL | 5 refills | Status: AC

## 2022-10-05 MED ORDER — MONTELUKAST SODIUM 10 MG PO TABS: 10.0000 mg | ORAL_TABLET | Freq: Every day | ORAL | 5 refills | Status: DC

## 2022-10-05 NOTE — Progress Notes (Signed)
[address] [place] [address] Dept: [phone]  FOLLOW UP NOTE  Patient ID: Jeremy [name], male    DOB: 1940-06-28  Age: 82 y.o. MRN: [id number] Date of Office Visit: 10/05/2022  Assessment  Chief Complaint: Follow-up  HPI Jeremy [name] is an 82 year old male who presents to the clinic for follow-up visit.  He was last seen in this clinic on 10/24/2021 by Thermon Leyland, FNP, for allergic rhinitis evaluation.  His last environmental allergy skin testing was in 2016 and was positive to dust mite, mold, and tree pollen.    At today's visit, he reports that his allergic rhinitis has not been well controlled over the last few weeks with symptoms including clear rhinorrhea, nasal congestion, and sneezing. He continues Sudafed and Afrin for relief of symptoms. He reports that he usually experiences symptoms in the spring and has not typically had many symptoms in the fall season. He reports that he has previously  used Ryaltris with relief of symptoms. He reports that his blood pressure has been elevated recently. He has attributed this to his wife recently passing. We also discussed the potential for Sudafed to increase his blood pressure.   Allergic conjunctivitis is reported as moderately well controlled with occasional red and itchy eyes for which he is not currently using any medical intervention.   He reports that about 2 months ago he was cleaning out his gutters and he reached into a group of ants. He reports some local swelling for which he took antihistamines with relief of symptoms. He denies concomitant cardiopulmonary or gastrointestinal symptoms with the local swelling.   His current medications are listed in the chart.    Drug Allergies:  Allergies  Allergen Reactions   Aspirin     REACTION: gi    Physical Exam: BP 130/70   Pulse 73   Temp 97.6 F (36.4 C) (Temporal)   Resp 12   Wt 177 lb 11.2 oz (80.6 kg)   SpO2 98%   BMI 27.83 kg/m    Physical  Exam Vitals reviewed.  Constitutional:      Appearance: Normal appearance.  HENT:     Head: Normocephalic and atraumatic.     Right Ear: Tympanic membrane normal.     Left Ear: Tympanic membrane normal.     Nose:     Comments: Bilateral nares slightly erythematous with clear nasal drainage noted. Pharynx normal. Ears normal. Eyes normal.    Mouth/Throat:     Pharynx: Oropharynx is clear.  Eyes:     Conjunctiva/sclera: Conjunctivae normal.  Cardiovascular:     Rate and Rhythm: Normal rate and regular rhythm.     Heart sounds: Normal heart sounds. No murmur heard. Pulmonary:     Effort: Pulmonary effort is normal.     Breath sounds: Normal breath sounds.     Comments: Lungs clear to ausculation Musculoskeletal:        General: Normal range of motion.     Cervical back: Normal range of motion and neck supple.  Skin:    General: Skin is warm and dry.  Neurological:     Mental Status: He is alert and oriented to person, place, and time.  Psychiatric:        Mood and Affect: Mood normal.        Behavior: Behavior normal.        Thought Content: Thought content normal.        Judgment: Judgment normal.     Assessment and  Plan: 1. Seasonal allergic rhinitis due to pollen     Meds ordered this encounter  Medications   DISCONTD: montelukast (SINGULAIR) 10 MG tablet    Sig: Take 1 tablet (10 mg total) by mouth at bedtime.    Dispense:  30 tablet    Refill:  5   montelukast (SINGULAIR) 10 MG tablet    Sig: Take 1 tablet (10 mg total) by mouth at bedtime.    Dispense:  30 tablet    Refill:  5    Patient Instructions   1. Every night use the following during periods of upper airway symptoms:   A. Begin Ryaltris 2 sprays in each nostril twice a day as needed for nasal symptoms. (Stop Afrin)  B. Montelukast 10 mg tablet daily  C. Nasal saline rinse  2. If needed use over-the-counter antihistamine (may make you sleepy). Some examples of over the counter antihistamines  include Zyrtec (cetirizine), Xyzal (levocetirizine), Allegra (fexofenadine), and Claritin (loratidine). Stop Sudafed as this may increase your blood pressure  3. Return to clinic in 1 year or earlier if problem   Return in about 1 year (around 10/05/2023), or if symptoms worsen or fail to improve.    Thank you for the opportunity to care for this patient.  Please do not hesitate to contact me with questions.  Thermon Leyland, FNP [facility]

## 2022-10-05 NOTE — Patient Instructions (Addendum)
1. Every night use the following during periods of upper airway symptoms:   A. Begin Ryaltris 2 sprays in each nostril twice a day as needed for nasal symptoms. (Stop Afrin)  B. Montelukast 10 mg tablet daily  C. Nasal saline rinse  2. If needed use over-the-counter antihistamine (may make you sleepy). Some examples of over the counter antihistamines include Zyrtec (cetirizine), Xyzal (levocetirizine), Allegra (fexofenadine), and Claritin (loratidine). Stop Sudafed as this may increase your blood pressure  3. Return to clinic in 1 year or earlier if problem  Reducing Pollen Exposure The American Academy of Allergy, Asthma and Immunology suggests the following steps to reduce your exposure to pollen during allergy seasons. Do not hang sheets or clothing out to dry; pollen may collect on these items. Do not mow lawns or spend time around freshly cut grass; mowing stirs up pollen. Keep windows closed at night.  Keep car windows closed while driving. Minimize morning activities outdoors, a time when pollen counts are usually at their highest. Stay indoors as much as possible when pollen counts or humidity is high and on windy days when pollen tends to remain in the air longer. Use air conditioning when possible.  Many air conditioners have filters that trap the pollen spores. Use a HEPA room air filter to remove pollen form the indoor air you breathe.  Control of Mold Allergen Mold and fungi can grow on a variety of surfaces provided certain temperature and moisture conditions exist.  Outdoor molds grow on plants, decaying vegetation and soil.  The major outdoor mold, Alternaria and Cladosporium, are found in very high numbers during hot and dry conditions.  Generally, a late Summer - Fall peak is seen for common outdoor fungal spores.  Rain will temporarily lower outdoor mold spore count, but counts rise rapidly when the rainy period ends.  The most important indoor molds are Aspergillus and  Penicillium.  Dark, humid and poorly ventilated basements are ideal sites for mold growth.  The next most common sites of mold growth are the bathroom and the kitchen.  Outdoor [employer] Use air conditioning and keep windows closed Avoid exposure to decaying vegetation. Avoid leaf raking. Avoid grain handling. Consider wearing a face mask if working in moldy areas.  Indoor Mold Control Maintain humidity below 50%. Clean washable surfaces with 5% bleach solution. Remove sources e.g. Contaminated carpets.   Control of Dust Mite Allergen Dust mites play a major role in allergic asthma and rhinitis. They occur in environments with high humidity wherever human skin is found. Dust mites absorb humidity from the atmosphere (ie, they do not drink) and feed on organic matter (including shed human and animal skin). Dust mites are a microscopic type of insect that you cannot see with the naked eye. High levels of dust mites have been detected from mattresses, pillows, carpets, upholstered furniture, bed covers, clothes, soft toys and any woven material. The principal allergen of the dust mite is found in its feces. A gram of dust may contain 1,000 mites and 250,000 fecal particles. Mite antigen is easily measured in the air during house cleaning activities. Dust mites do not bite and do not cause harm to humans, other than by triggering allergies/asthma.  Ways to decrease your exposure to dust mites in your home:  1. Encase mattresses, box springs and pillows with a mite-impermeable barrier or cover  2. Wash sheets, blankets and drapes weekly in hot water (130 F) with detergent and dry them in a dryer on the  hot setting.  3. Have the room cleaned frequently with a vacuum cleaner and a damp dust-mop. For carpeting or rugs, vacuuming with a vacuum cleaner equipped with a high-efficiency particulate air (HEPA) filter. The dust mite allergic individual should not be in a room which is being cleaned and  should wait 1 hour after cleaning before going into the room.  4. Do not sleep on upholstered furniture (eg, couches).  5. If possible removing carpeting, upholstered furniture and drapery from the home is ideal. Horizontal blinds should be eliminated in the rooms where the person spends the most time (bedroom, study, television room). Washable vinyl, roller-type shades are optimal.  6. Remove all non-washable stuffed toys from the bedroom. Wash stuffed toys weekly like sheets and blankets above.  7. Reduce indoor humidity to less than 50%. Inexpensive humidity monitors can be purchased at most hardware stores. Do not use a humidifier as can make the problem worse and are not recommended.

## 2022-10-06 MED ORDER — RYALTRIS 665-25 MCG/ACT NA SUSP: 2.0000 | Freq: Two times a day (BID) | NASAL | 5 refills | Status: DC

## 2022-10-06 NOTE — Addendum Note (Signed)
Addended by: Rolland Bimler D on: 10/06/2022 08:50 AM   Modules accepted: Orders

## 2022-10-07 DIAGNOSIS — R69 Illness, unspecified: Secondary | ICD-10-CM | POA: Diagnosis not present

## 2022-10-15 DIAGNOSIS — R69 Illness, unspecified: Secondary | ICD-10-CM | POA: Diagnosis not present

## 2022-10-19 ENCOUNTER — Ambulatory Visit: Payer: Medicare HMO

## 2022-10-21 ENCOUNTER — Ambulatory Visit (INDEPENDENT_AMBULATORY_CARE_PROVIDER_SITE_OTHER): Payer: Medicare HMO | Admitting: Radiology

## 2022-10-21 DIAGNOSIS — Z23 Encounter for immunization: Secondary | ICD-10-CM | POA: Diagnosis not present

## 2022-10-21 NOTE — Progress Notes (Cosign Needed Addendum)
Patient here for Flu shot. Patient tolerated well with no complications.   Medical screening examination/treatment/procedure(s) were performed by non-physician practitioner and as supervising physician I was immediately available for consultation/collaboration.  I agree with above. Jacinta Shoe, MD

## 2022-10-22 ENCOUNTER — Ambulatory Visit: Payer: Medicare HMO

## 2022-11-05 DIAGNOSIS — R69 Illness, unspecified: Secondary | ICD-10-CM | POA: Diagnosis not present

## 2022-11-10 DIAGNOSIS — R69 Illness, unspecified: Secondary | ICD-10-CM | POA: Diagnosis not present

## 2022-11-17 DIAGNOSIS — R69 Illness, unspecified: Secondary | ICD-10-CM | POA: Diagnosis not present

## 2022-11-25 ENCOUNTER — Ambulatory Visit: Payer: Medicare HMO | Admitting: Internal Medicine

## 2022-11-25 ENCOUNTER — Encounter: Payer: Self-pay | Admitting: Internal Medicine

## 2022-11-25 ENCOUNTER — Ambulatory Visit (INDEPENDENT_AMBULATORY_CARE_PROVIDER_SITE_OTHER): Payer: Medicare HMO

## 2022-11-25 VITALS — BP 124/64 | HR 67 | Temp 97.7°F | Ht 67.0 in | Wt 178.0 lb

## 2022-11-25 DIAGNOSIS — M545 Low back pain, unspecified: Secondary | ICD-10-CM

## 2022-11-25 DIAGNOSIS — N200 Calculus of kidney: Secondary | ICD-10-CM | POA: Diagnosis not present

## 2022-11-25 DIAGNOSIS — M48061 Spinal stenosis, lumbar region without neurogenic claudication: Secondary | ICD-10-CM | POA: Diagnosis not present

## 2022-11-25 DIAGNOSIS — G8929 Other chronic pain: Secondary | ICD-10-CM

## 2022-11-25 DIAGNOSIS — M47816 Spondylosis without myelopathy or radiculopathy, lumbar region: Secondary | ICD-10-CM | POA: Diagnosis not present

## 2022-11-25 DIAGNOSIS — F4321 Adjustment disorder with depressed mood: Secondary | ICD-10-CM

## 2022-11-25 MED ORDER — MELOXICAM 15 MG PO TABS: ORAL_TABLET | ORAL | 1 refills | Status: DC

## 2022-11-25 MED ORDER — METHOCARBAMOL 500 MG PO TABS: 500.0000 mg | ORAL_TABLET | Freq: Three times a day (TID) | ORAL | 1 refills | Status: DC | PRN

## 2022-11-25 NOTE — Progress Notes (Signed)
Subjective:  Patient ID: Jeremy [name], male    DOB: October 29, 1940  Age: 82 y.o. MRN: [id number]  CC: No chief complaint on file.   HPI Jeremy [name] presents for LBP, L side - worse w/turning  X ray 12/2021: AP lateral lumbar x-rays obtained and reviewed.  Again noted 3 mm likely  kidney stone.  L2 10% compression unchanged.  Comparison 11/25/2021  images.   Impression: Stable L2 lumbar compression fracture 10%.   Outpatient Medications Prior to Visit  Medication Sig Dispense Refill   Cholecalciferol (VITAMIN D3) 50 MCG (2000 UT) capsule Take 1 capsule (2,000 Units total) by mouth daily. 100 capsule 3   montelukast (SINGULAIR) 10 MG tablet Take 1 tablet (10 mg total) by mouth at bedtime. 30 tablet 5   azelastine (ASTELIN) 0.1 % nasal spray Place 2 sprays into both nostrils 2 (two) times daily. Use in each nostril as directed (Patient not taking: Reported on 12/08/2022) 30 mL 11   cephALEXin (KEFLEX) 500 MG capsule 2 caps po bid x 7 days (Patient not taking: Reported on 10/05/2022) 28 capsule 0   Olopatadine-Mometasone (RYALTRIS) 665-25 MCG/ACT SUSP Place 2 sprays into the nose 2 (two) times daily. (Patient not taking: Reported on 12/08/2022) 29 g 5   No facility-administered medications prior to visit.    ROS: Review of Systems  Constitutional:  Negative for appetite change, fatigue and unexpected weight change.  HENT:  Negative for congestion, nosebleeds, sneezing, sore throat and trouble swallowing.   Eyes:  Negative for itching and visual disturbance.  Respiratory:  Negative for cough.   Cardiovascular:  Negative for chest pain, palpitations and leg swelling.  Gastrointestinal:  Negative for abdominal distention, blood in stool, diarrhea and nausea.  Genitourinary:  Negative for frequency and hematuria.  Musculoskeletal:  Negative for back pain, gait problem, joint swelling and neck pain.  Skin:  Negative for rash.  Neurological:  Negative for dizziness, tremors,  speech difficulty and weakness.  Psychiatric/Behavioral:  Negative for agitation, dysphoric mood and sleep disturbance. The patient is not nervous/anxious.     Objective:  BP 124/64 (BP Location: Left Arm, Patient Position: Sitting, Cuff Size: Normal)   Pulse 67   Temp 97.7 F (36.5 C) (Oral)   Ht 5\' 7"  (1.702 m)   Wt 178 lb (80.7 kg)   SpO2 98%   BMI 27.88 kg/m   BP Readings from Last 3 Encounters:  12/08/22 134/68  11/25/22 124/64  10/05/22 130/70    Wt Readings from Last 3 Encounters:  11/25/22 178 lb (80.7 kg)  10/05/22 177 lb 11.2 oz (80.6 kg)  09/07/22 171 lb (77.6 kg)    Physical Exam Constitutional:      General: He is not in acute distress.    Appearance: He is well-developed.     Comments: NAD  Eyes:     Conjunctiva/sclera: Conjunctivae normal.     Pupils: Pupils are equal, round, and reactive to light.  Neck:     Thyroid: No thyromegaly.     Vascular: No JVD.  Cardiovascular:     Rate and Rhythm: Normal rate and regular rhythm.     Heart sounds: Normal heart sounds. No murmur heard.    No friction rub. No gallop.  Pulmonary:     Effort: Pulmonary effort is normal. No respiratory distress.     Breath sounds: Normal breath sounds. No wheezing or rales.  Chest:     Chest wall: No tenderness.  Abdominal:     General: Bowel  sounds are normal. There is no distension.     Palpations: Abdomen is soft. There is no mass.     Tenderness: There is no abdominal tenderness. There is no guarding or rebound.  Musculoskeletal:        General: No tenderness. Normal range of motion.     Cervical back: Normal range of motion.  Lymphadenopathy:     Cervical: No cervical adenopathy.  Skin:    General: Skin is warm and dry.     Findings: No rash.  Neurological:     Mental Status: He is alert and oriented to person, place, and time.     Cranial Nerves: No cranial nerve deficit.     Motor: No abnormal muscle tone.     Coordination: Coordination normal.     Gait: Gait  normal.     Deep Tendon Reflexes: Reflexes are normal and symmetric.  Psychiatric:        Behavior: Behavior normal.        Thought Content: Thought content normal.        Judgment: Judgment normal.     Lab Results  Component Value Date   WBC 4.8 09/07/2022   HGB 15.2 09/07/2022   HCT 45.0 09/07/2022   PLT 200.0 09/07/2022   GLUCOSE 96 09/07/2022   CHOL 157 09/07/2022   TRIG 72.0 09/07/2022   HDL 37.50 (L) 09/07/2022   LDLCALC 105 (H) 09/07/2022   ALT 18 09/07/2022   AST 18 09/07/2022   NA 139 09/07/2022   K 4.5 09/07/2022   CL 105 09/07/2022   CREATININE 0.77 09/07/2022   BUN 17 09/07/2022   CO2 27 09/07/2022   TSH 1.58 09/07/2022   PSA 0.21 09/07/2022   INR 1.0 ratio 03/27/2008    No results found.  Assessment & Plan:   Problem List Items Addressed This Visit     Chronic low back pain - Primary   Relevant Orders   DG Lumbar Spine 2-3 Views (Completed)   Grief    [name] has been coping.  The family is very supportive.         Meds ordered this encounter  Medications   DISCONTD: meloxicam (MOBIC) 15 MG tablet    Sig: Take 1 po pc x 10 days, then prn pain    Dispense:  30 tablet    Refill:  1   DISCONTD: methocarbamol (ROBAXIN) 500 MG tablet    Sig: Take 1 tablet (500 mg total) by mouth every 8 (eight) hours as needed for muscle spasms.    Dispense:  60 tablet    Refill:  1      Follow-up: No follow-ups on file.  Sonda Primes, MD

## 2022-12-03 DIAGNOSIS — L820 Inflamed seborrheic keratosis: Secondary | ICD-10-CM | POA: Diagnosis not present

## 2022-12-03 DIAGNOSIS — L538 Other specified erythematous conditions: Secondary | ICD-10-CM | POA: Diagnosis not present

## 2022-12-03 DIAGNOSIS — L578 Other skin changes due to chronic exposure to nonionizing radiation: Secondary | ICD-10-CM | POA: Diagnosis not present

## 2022-12-03 DIAGNOSIS — L821 Other seborrheic keratosis: Secondary | ICD-10-CM | POA: Diagnosis not present

## 2022-12-03 DIAGNOSIS — L814 Other melanin hyperpigmentation: Secondary | ICD-10-CM | POA: Diagnosis not present

## 2022-12-03 DIAGNOSIS — L570 Actinic keratosis: Secondary | ICD-10-CM | POA: Diagnosis not present

## 2022-12-07 DIAGNOSIS — R69 Illness, unspecified: Secondary | ICD-10-CM | POA: Diagnosis not present

## 2022-12-07 NOTE — Progress Notes (Signed)
   [address] [place] [address] Dept: [phone]  FOLLOW UP NOTE  Patient ID: Jeremy [name], male    DOB: 1940-10-30  Age: 82 y.o. MRN: [id number] Date of Office Visit: 12/08/2022  Assessment  Chief Complaint: No chief complaint on file.  HPI Jeremy [name] is an 82 year old male who presents to the clinic for evaluation of sinus issues.  His last visit to this clinic was on 10/05/2022 by Thermon Leyland, FNP, for evaluation of allergic rhinitis. His last environmental allergy skin testing was on 09/11/2014 and was positive to dust mites, molds, and tree pollens.  Dust mite covers are in use on the mattress and pillows. Discussed the use of AI scribe software for clinical note transcription with the patient, who gave verbal consent to proceed.  History of Present Illness             Drug Allergies:  Allergies  Allergen Reactions   Aspirin     REACTION: gi    Physical Exam: There were no vitals taken for this visit.   Physical Exam  Diagnostics:    Assessment and Plan: No diagnosis found.  No orders of the defined types were placed in this encounter.   There are no Patient Instructions on file for this visit.  No follow-ups on file.    Thank you for the opportunity to care for this patient.  Please do not hesitate to contact me with questions.  Thermon Leyland, FNP [facility]

## 2022-12-07 NOTE — Patient Instructions (Incomplete)
1. Every night use the following during periods of upper airway symptoms:   A. Begin azelastine 2 sprays in each nostril twice a day if needed for a runny nose. (Stop Afrin). Begin Flonase 2 sprays in each nostril once a day for a stuffy nose  B. Montelukast 10 mg tablet daily  C. Nasal saline rinse  2. If needed use over-the-counter antihistamine (may make you sleepy). Some examples of over the counter antihistamines include Zyrtec (cetirizine), Xyzal (levocetirizine), Allegra (fexofenadine), and Claritin (loratidine).   3. Return to clinic in 1 year or earlier if problem.  Consider updating your environmental allergy testing.  Reducing Pollen Exposure The American Academy of Allergy, Asthma and Immunology suggests the following steps to reduce your exposure to pollen during allergy seasons. Do not hang sheets or clothing out to dry; pollen may collect on these items. Do not mow lawns or spend time around freshly cut grass; mowing stirs up pollen. Keep windows closed at night.  Keep car windows closed while driving. Minimize morning activities outdoors, a time when pollen counts are usually at their highest. Stay indoors as much as possible when pollen counts or humidity is high and on windy days when pollen tends to remain in the air longer. Use air conditioning when possible.  Many air conditioners have filters that trap the pollen spores. Use a HEPA room air filter to remove pollen form the indoor air you breathe.  Control of Mold Allergen Mold and fungi can grow on a variety of surfaces provided certain temperature and moisture conditions exist.  Outdoor molds grow on plants, decaying vegetation and soil.  The major outdoor mold, Alternaria and Cladosporium, are found in very high numbers during hot and dry conditions.  Generally, a late Summer - Fall peak is seen for common outdoor fungal spores.  Rain will temporarily lower outdoor mold spore count, but counts rise rapidly when the  rainy period ends.  The most important indoor molds are Aspergillus and Penicillium.  Dark, humid and poorly ventilated basements are ideal sites for mold growth.  The next most common sites of mold growth are the bathroom and the kitchen.  Outdoor [employer] Use air conditioning and keep windows closed Avoid exposure to decaying vegetation. Avoid leaf raking. Avoid grain handling. Consider wearing a face mask if working in moldy areas.  Indoor Mold Control Maintain humidity below 50%. Clean washable surfaces with 5% bleach solution. Remove sources e.g. Contaminated carpets.   Control of Dust Mite Allergen Dust mites play a major role in allergic asthma and rhinitis. They occur in environments with high humidity wherever human skin is found. Dust mites absorb humidity from the atmosphere (ie, they do not drink) and feed on organic matter (including shed human and animal skin). Dust mites are a microscopic type of insect that you cannot see with the naked eye. High levels of dust mites have been detected from mattresses, pillows, carpets, upholstered furniture, bed covers, clothes, soft toys and any woven material. The principal allergen of the dust mite is found in its feces. A gram of dust may contain 1,000 mites and 250,000 fecal particles. Mite antigen is easily measured in the air during house cleaning activities. Dust mites do not bite and do not cause harm to humans, other than by triggering allergies/asthma.  Ways to decrease your exposure to dust mites in your home:  1. Encase mattresses, box springs and pillows with a mite-impermeable barrier or cover  2. Wash sheets, blankets and drapes weekly in  hot water (130 F) with detergent and dry them in a dryer on the hot setting.  3. Have the room cleaned frequently with a vacuum cleaner and a damp dust-mop. For carpeting or rugs, vacuuming with a vacuum cleaner equipped with a high-efficiency particulate air (HEPA) filter. The dust mite  allergic individual should not be in a room which is being cleaned and should wait 1 hour after cleaning before going into the room.  4. Do not sleep on upholstered furniture (eg, couches).  5. If possible removing carpeting, upholstered furniture and drapery from the home is ideal. Horizontal blinds should be eliminated in the rooms where the person spends the most time (bedroom, study, television room). Washable vinyl, roller-type shades are optimal.  6. Remove all non-washable stuffed toys from the bedroom. Wash stuffed toys weekly like sheets and blankets above.  7. Reduce indoor humidity to less than 50%. Inexpensive humidity monitors can be purchased at most hardware stores. Do not use a humidifier as can make the problem worse and are not recommended.

## 2022-12-08 ENCOUNTER — Encounter: Payer: Self-pay | Admitting: Family Medicine

## 2022-12-08 ENCOUNTER — Ambulatory Visit: Payer: Medicare HMO | Admitting: Family Medicine

## 2022-12-08 VITALS — BP 134/68 | HR 65 | Temp 97.6°F | Resp 16

## 2022-12-08 DIAGNOSIS — J301 Allergic rhinitis due to pollen: Secondary | ICD-10-CM | POA: Diagnosis not present

## 2022-12-08 MED ORDER — AZELASTINE HCL 0.1 % NA SOLN: 2.0000 | Freq: Two times a day (BID) | NASAL | 5 refills | Status: AC

## 2022-12-08 MED ORDER — FLUTICASONE PROPIONATE 50 MCG/ACT NA SUSP: 2.0000 | Freq: Every day | NASAL | 5 refills | Status: AC | PRN

## 2022-12-13 ENCOUNTER — Encounter: Payer: Self-pay | Admitting: Internal Medicine

## 2022-12-13 NOTE — Assessment & Plan Note (Signed)
Jeremy [name] has been coping.  The family is very supportive.

## 2023-01-04 DIAGNOSIS — H524 Presbyopia: Secondary | ICD-10-CM | POA: Diagnosis not present

## 2023-02-18 ENCOUNTER — Ambulatory Visit: Payer: Medicare HMO

## 2023-02-18 VITALS — BP 142/80 | HR 72 | Ht 67.0 in | Wt 181.8 lb

## 2023-02-18 DIAGNOSIS — Z0000 Encounter for general adult medical examination without abnormal findings: Secondary | ICD-10-CM | POA: Diagnosis not present

## 2023-02-18 NOTE — Patient Instructions (Signed)
 Mr. Engelmann , Thank you for taking time to come for your Medicare Wellness Visit. I appreciate your ongoing commitment to your health goals. Please review the following plan we discussed and let me know if I can assist you in the future.   Referrals/Orders/Follow-Ups/Clinician Recommendations: It was nice meeting you today.  Keep up the good work.   This is a list of the screening recommended for you and due dates:  Health Maintenance  Topic Date Due   COVID-19 Vaccine (4 - 2024-25 season) 09/13/2022   Medicare Annual Wellness Visit  02/18/2024   DTaP/Tdap/Td vaccine (3 - Td or Tdap) 09/27/2028   Pneumonia Vaccine  Completed   Flu Shot  Completed   Zoster (Shingles) Vaccine  Completed   HPV Vaccine  Aged Out    Advanced directives: (Copy Requested) Please bring a copy of your health care power of attorney and living will to the office to be added to your chart at your convenience.  Next Medicare Annual Wellness Visit scheduled for next year: Yes

## 2023-02-18 NOTE — Progress Notes (Addendum)
 Subjective:   Jeremy [name] is a 83 y.o. male who presents for Medicare Annual/Subsequent preventive examination.  Visit Complete: In person  Patient Medicare AWV questionnaire was completed by the patient on 02/17/2023; I have confirmed that all information answered by patient is correct and no changes since this date.  Cardiac Risk Factors include: advanced age (>3men, >72 women);male gender;Other (see comment);dyslipidemia, Risk factor comments: Prostate cancer     Objective:    Today's Vitals   02/18/23 1500  BP: (!) 142/80  Pulse: 72  SpO2: 100%  Weight: 181 lb 12.8 oz (82.5 kg)  Height: 5' 7 (1.702 m)   Body mass index is 28.47 kg/m.     02/18/2023    3:04 PM 07/27/2022   10:25 AM 03/06/2022    9:38 AM 07/01/2021   11:10 AM 02/21/2021    3:02 PM 12/27/2019    3:45 PM 04/21/2019   12:58 PM  Advanced Directives  Does Patient Have a Medical Advance Directive? Yes Yes Yes No Yes Yes Yes  Type of Estate Agent of [place];Living will Healthcare Power of [place];Living will Healthcare Power of [place];Living will  Healthcare Power of Attorney    Does patient want to make changes to medical advance directive?      No - Patient declined   Copy of Healthcare Power of Attorney in Chart? No - copy requested  No - copy requested  No - copy requested      Current Medications (verified) Outpatient Encounter Medications as of 02/18/2023  Medication Sig   azelastine  (ASTELIN ) 0.1 % nasal spray Place 2 sprays into both nostrils 2 (two) times daily.   Cholecalciferol (VITAMIN D3) 50 MCG (2000 UT) capsule Take 1 capsule (2,000 Units total) by mouth daily.   fluticasone  (FLONASE ) 50 MCG/ACT nasal spray Place 2 sprays into both nostrils daily as needed for allergies or rhinitis.   montelukast  (SINGULAIR ) 10 MG tablet Take 1 tablet (10 mg total) by mouth at bedtime.   No facility-administered encounter medications on file as of 02/18/2023.    Allergies  (verified) Aspirin   History: Past Medical History:  Diagnosis Date   AK (actinic keratosis)    Allergic rhinitis    History of colonic polyps    Dr. Teressa- Due 2018   History of skin cancer    Osteopenia    Prostate cancer (HCC)    hx of s/p XRT, seeds Dr. Alline   Past Surgical History:  Procedure Laterality Date   INSERTION PROSTATE RADIATION SEED     KNEE ARTHROSCOPY Right    KNEE SURGERY Left    TONSILLECTOMY     XRT  2008   Family History  Problem Relation Age of Onset   Liver disease Father    Cancer Father        pancreas ?   Cancer - Other Other        intestinal cancer   Social History   Socioeconomic History   Marital status: Widowed    Spouse name: Not on file   Number of children: 2   Years of education: Not on file   Highest education level: Some college, no degree  Occupational History   Occupation: retired from airline pilot  Tobacco Use   Smoking status: Some Days    Types: Cigars   Smokeless tobacco: Never   Tobacco comments:    2 cigars a month  Vaping Use   Vaping status: Never Used  Substance and Sexual Activity  Alcohol use: Yes    Alcohol/week: 2.0 standard drinks of alcohol    Types: 2 Cans of beer per week   Drug use: No   Sexual activity: Yes  Other Topics Concern   Not on file  Social History Narrative   Lives alone-2025   Social Drivers of Health   Financial Resource Strain: Low Risk  (02/17/2023)   Overall Financial Resource Strain (CARDIA)    Difficulty of Paying Living Expenses: Not hard at all  Food Insecurity: No Food Insecurity (02/17/2023)   Hunger Vital Sign    Worried About Running Out of Food in the Last Year: Never true    Ran Out of Food in the Last Year: Never true  Transportation Needs: No Transportation Needs (02/17/2023)   PRAPARE - Administrator, Civil Service (Medical): No    Lack of Transportation (Non-Medical): No  Physical Activity: Sufficiently Active (02/17/2023)   Exercise Vital Sign    Days  of Exercise per Week: 3 days    Minutes of Exercise per Session: 150+ min  Stress: No Stress Concern Present (02/17/2023)   [employer] of Occupational Health - Occupational Stress Questionnaire    Feeling of Stress : Only a little  Social Connections: Moderately Integrated (02/17/2023)   Social Connection and Isolation Panel [NHANES]    Frequency of Communication with Friends and Family: More than three times a week    Frequency of Social Gatherings with Friends and Family: More than three times a week    Attends Religious Services: More than 4 times per year    Active Member of [employer] or Organizations: Yes    Attends Banker Meetings: More than 4 times per year    Marital Status: Widowed    Tobacco Counseling Ready to quit: Not Answered Counseling given: Not Answered Tobacco comments: 2 cigars a month   Clinical Intake:  Pre-visit preparation completed: Yes  Pain : No/denies pain     Nutritional Risks: None  How often do you need to have someone help you when you read instructions, pamphlets, or other written materials from your doctor or pharmacy?: 1 - Never  Interpreter Needed?: No  Information entered by :: Tabetha Haraway, RMA   Activities of Daily Living    02/17/2023    4:07 PM 03/06/2022    9:30 AM  In your present state of health, do you have any difficulty performing the following activities:  Hearing? 0 0  Vision? 0 0  Difficulty concentrating or making decisions? 0 0  Walking or climbing stairs? 0 0  Dressing or bathing? 0 0  Doing errands, shopping? 0 0  Preparing Food and eating ? N N  Using the Toilet? N N  In the past six months, have you accidently leaked urine? N N  Do you have problems with loss of bowel control? N N  Managing your Medications? N N  Managing your Finances? N N  Housekeeping or managing your Housekeeping? N N    Patient Care Team: Plotnikov, Karlynn GAILS, MD as PCP - General Alline Lenis, MD (Inactive)  (Urology) Glendia Simmonds, OD as Referring Physician (Optometry) Devere Lonni Righter, MD as Consulting Physician (Urology)  Indicate any recent Medical Services you may have received from other than [facility] providers in the past year (date may be approximate).     Assessment:   This is a routine wellness examination for Jeremy [name].  Hearing/Vision screen Hearing Screening - Comments:: Denies hearing difficulties   Vision Screening -  Comments:: Denies vision issues.    Goals Addressed             This Visit's Progress    My goal for 2024 is to continue going to the gym and playing golf.   On track     Depression Screen    02/18/2023    3:04 PM 11/25/2022    4:10 PM 09/07/2022    9:07 AM 03/06/2022    9:27 AM 12/08/2021    9:14 AM 11/25/2021   11:04 AM 11/20/2021    4:22 PM  PHQ 2/9 Scores  PHQ - 2 Score 0 0 1 0 0 0 0  PHQ- 9 Score 0 0   0  0    Fall Risk    02/17/2023    4:07 PM 11/25/2022    4:10 PM 09/07/2022    9:07 AM 03/06/2022    9:30 AM 12/08/2021    9:14 AM  Fall Risk   Falls in the past year? 0 0 0 0 0  Number falls in past yr: 0 0 0 0 0  Injury with Fall? 0 0 0 0 0  Risk for fall due to :   No Fall Risks No Fall Risks No Fall Risks  Follow up Falls evaluation completed;Falls prevention discussed Falls evaluation completed Falls evaluation completed Falls prevention discussed Falls evaluation completed    MEDICARE RISK AT HOME: Medicare Risk at Home Any stairs in or around the home?: (Patient-Rptd) No If so, are there any without handrails?: (Patient-Rptd) No Home free of loose throw rugs in walkways, pet beds, electrical cords, etc?: (Patient-Rptd) Yes Adequate lighting in your home to reduce risk of falls?: (Patient-Rptd) Yes Life alert?: (Patient-Rptd) No Use of a cane, walker or w/c?: (Patient-Rptd) No Grab bars in the bathroom?: (Patient-Rptd) No Shower chair or bench in shower?: (Patient-Rptd) No Elevated toilet seat or a handicapped toilet?:  (Patient-Rptd) No  TIMED UP AND GO:  Was the test performed?  Yes  Length of time to ambulate 10 feet: 15 sec Gait slow and steady without use of assistive device    Cognitive Function:        02/18/2023    2:54 PM 03/06/2022    9:31 AM 02/21/2021    3:05 PM  6CIT Screen  What Year? 0 points 0 points 0 points  What month? 0 points 0 points 0 points  What time? 0 points 0 points 0 points  Count back from 20 0 points 0 points 0 points  Months in reverse 0 points 0 points 0 points  Repeat phrase 0 points 0 points 0 points  Total Score 0 points 0 points 0 points    Immunizations Immunization History  Administered Date(s) Administered   Fluad Quad(high Dose 65+) 10/05/2018, 10/27/2019, 10/16/2020, 10/24/2021   Fluad Trivalent(High Dose 65+) 10/21/2022   Influenza Split 10/08/2011   Influenza Whole 11/18/2006, 10/31/2007, 09/22/2010   Influenza, High Dose Seasonal PF 11/07/2015, 11/12/2016, 11/15/2017   Influenza,inj,Quad PF,6+ Mos 10/14/2012, 10/20/2013, 10/22/2014   PFIZER(Purple Top)SARS-COV-2 Vaccination 02/06/2019, 02/27/2019, 11/11/2019   Pneumococcal Conjugate-13 04/22/2015   Pneumococcal Polysaccharide-23 08/22/2009, 05/30/2020   Td 09/13/2008   Tdap 09/28/2018   Zoster Recombinant(Shingrix) 03/11/2018, 07/18/2018   Zoster, Live 10/18/2009    TDAP status: Up to date  Flu Vaccine status: Up to date  Pneumococcal vaccine status: Up to date  Covid-19 vaccine status: Declined, Education has been provided regarding the importance of this vaccine but patient still declined. Advised may receive this vaccine at local  pharmacy or Health Dept.or vaccine clinic. Aware to provide a copy of the vaccination record if obtained from local pharmacy or Health Dept. Verbalized acceptance and understanding.  Qualifies for Shingles Vaccine? Yes   Zostavax completed Yes   Shingrix Completed?: Yes  Screening Tests Health Maintenance  Topic Date Due   COVID-19 Vaccine (4 - 2024-25  season) 09/13/2022   Medicare Annual Wellness (AWV)  02/18/2024   DTaP/Tdap/Td (3 - Td or Tdap) 09/27/2028   Pneumonia Vaccine 40+ Years old  Completed   INFLUENZA VACCINE  Completed   Zoster Vaccines- Shingrix  Completed   HPV VACCINES  Aged Out    Health Maintenance  Health Maintenance Due  Topic Date Due   COVID-19 Vaccine (4 - 2024-25 season) 09/13/2022    Colorectal cancer screening: No longer required.   Lung Cancer Screening: (Low Dose CT Chest recommended if Age 48-80 years, 20 pack-year currently smoking OR have quit w/in 15years.) does not qualify.   Lung Cancer Screening Referral: N/A  Additional Screening:  Hepatitis C Screening: does not qualify;   Vision Screening: Recommended annual ophthalmology exams for early detection of glaucoma and other disorders of the eye. Is the patient up to date with their annual eye exam?  Yes  Who is the provider or what is the name of the office in which the patient attends annual eye exams? Dr. Glendia If pt is not established with a provider, would they like to be referred to a provider to establish care? No .   Dental Screening: Recommended annual dental exams for proper oral hygiene  Community Resource Referral / Chronic Care Management: CRR required this visit?  No   CCM required this visit?  No     Plan:     I have personally reviewed and noted the following in the patient's chart:   Medical and social history Use of alcohol, tobacco or illicit drugs  Current medications and supplements including opioid prescriptions. Patient is not currently taking opioid prescriptions. Functional ability and status Nutritional status Physical activity Advanced directives List of other physicians Hospitalizations, surgeries, and ER visits in previous 12 months Vitals Screenings to include cognitive, depression, and falls Referrals and appointments  In addition, I have reviewed and discussed with patient certain preventive  protocols, quality metrics, and best practice recommendations. A written personalized care plan for preventive services as well as general preventive health recommendations were provided to patient.     Mackie Goon L Dandrae Kustra, CMA   02/18/2023   After Visit Summary: (MyChart) Due to this being a telephonic visit, the after visit summary with patients personalized plan was offered to patient via MyChart   Nurse Notes: Patient is up to date on all health maintenance with no concerns to address today.  Medical screening examination/treatment/procedure(s) were performed by non-physician practitioner and as supervising physician I was immediately available for consultation/collaboration.  I agree with above. Karlynn Noel, MD

## 2023-02-23 NOTE — Progress Notes (Unsigned)
[name] [address] [place] [address] Phone: [phone] Subjective:   Jeremy [name], am serving as a scribe for Dr. Antoine Primas.  I'm seeing this patient by the request  of:  Plotnikov, Georgina Quint, MD  CC: Left knee pain  [id number]  05/20/2022 Worsening pain again at this time.  Discussed icing regimen and home exercises.  Instability noted.  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  Injection given to help with some of the pain at this moment.  We discussed the long-term other possibilities is viscosupplementation.  Increase activity slowly otherwise.  Follow-up with me again in 2 to 3 months.      Update 02/24/2023 Jeremy [name] is a 83 y.o. male coming in with complaint of L knee pain.  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  Patient states has been doing relatively well.  Given a steroid injection in May. Patient said his pain increased last week after using a ladder.      Past Medical History:  Diagnosis Date   AK (actinic keratosis)    Allergic rhinitis    History of colonic polyps    Dr. Christella Hartigan- Due 2018   History of skin cancer    Osteopenia    Prostate cancer (HCC)    hx of s/p XRT, seeds Dr. Isabel Caprice   Past Surgical History:  Procedure Laterality Date   INSERTION PROSTATE RADIATION SEED     KNEE ARTHROSCOPY Right    KNEE SURGERY Left    TONSILLECTOMY     XRT  2008   Social History   Socioeconomic History   Marital status: Widowed    Spouse name: Not on file   Number of children: 2   Years of education: Not on file   Highest education level: Some college, no degree  Occupational History   Occupation: retired from Airline pilot  Tobacco Use   Smoking status: Some Days    Types: Cigars   Smokeless tobacco: Never   Tobacco comments:    2 cigars a month  Vaping Use   Vaping status: Never Used   Substance and Sexual Activity   Alcohol use: Yes    Alcohol/week: 2.0 standard drinks of alcohol    Types: 2 Cans of beer per week   Drug use: No   Sexual activity: Yes  Other Topics Concern   Not on file  Social History Narrative   Lives alone-2025   Social Drivers of Health   Financial Resource Strain: Low Risk  (02/17/2023)   Overall Financial Resource Strain (CARDIA)    Difficulty of Paying Living Expenses: Not hard at all  Food Insecurity: No Food Insecurity (02/17/2023)   Hunger Vital Sign    Worried About Running Out of Food in the Last Year: Never true    Ran Out of Food in the Last Year: Never true  Transportation Needs: No Transportation Needs (02/17/2023)   PRAPARE - Administrator, Civil Service (Medical): No    Lack of Transportation (Non-Medical): No  Physical Activity: Sufficiently Active (02/17/2023)   Exercise Vital Sign    Days of Exercise per Week: 3 days    Minutes of Exercise per Session: 150+ min  Stress: No Stress Concern Present (02/17/2023)   [employer] of Occupational Health - Occupational Stress Questionnaire    Feeling of Stress :  Only a little  Social Connections: Moderately Integrated (02/17/2023)   Social Connection and Isolation Panel [NHANES]    Frequency of Communication with Friends and Family: More than three times a week    Frequency of Social Gatherings with Friends and Family: More than three times a week    Attends Religious Services: More than 4 times per year    Active Member of [employer] or Organizations: Yes    Attends Banker Meetings: More than 4 times per year    Marital Status: Widowed   Allergies  Allergen Reactions   Aspirin     REACTION: gi   Family History  Problem Relation Age of Onset   Liver disease Father    Cancer Father        pancreas ?   Cancer - Other Other        intestinal cancer      Current Outpatient Medications (Respiratory):    azelastine (ASTELIN) 0.1 % nasal spray, Place 2  sprays into both nostrils 2 (two) times daily.   fluticasone (FLONASE) 50 MCG/ACT nasal spray, Place 2 sprays into both nostrils daily as needed for allergies or rhinitis.   montelukast (SINGULAIR) 10 MG tablet, Take 1 tablet (10 mg total) by mouth at bedtime.    Current Outpatient Medications (Other):    Cholecalciferol (VITAMIN D3) 50 MCG (2000 UT) capsule, Take 1 capsule (2,000 Units total) by mouth daily.   Reviewed prior external information including notes and imaging from  primary care provider As well as notes that were available from care everywhere and other healthcare systems.  Past medical history, social, surgical and family history all reviewed in electronic medical record.  No pertanent information unless stated regarding to the chief complaint.   Review of Systems:  No headache, visual changes, nausea, vomiting, diarrhea, constipation, dizziness, abdominal pain, skin rash, fevers, chills, night sweats, weight loss, swollen lymph nodes, body aches, joint swelling, chest pain, shortness of breath, mood changes. POSITIVE muscle aches  Objective  Blood pressure 132/72, pulse 75, height 5\' 7"  (1.702 m), weight 179 lb (81.2 kg), SpO2 98%.   General: No apparent distress alert and oriented x3 mood and affect normal, dressed appropriately.  HEENT: Pupils equal, extraocular movements intact  Respiratory: Patient's speak in full sentences and does not appear short of breath  Cardiovascular: No lower extremity edema, non tender, no erythema  Instability noted with crepitus noted as well. Trace effusion   Injection after informed written and verbal consent, patient was seated on exam table. Right knee was prepped with alcohol swab and utilizing anterolateral approach, patient's right knee space was injected with 4:1  marcaine 0.5%: Kenalog 40mg /dL. Patient tolerated the procedure well without immediate complications.    Impression and Recommendations:      The above  documentation has been reviewed and is accurate and complete Judi Saa, DO

## 2023-02-24 ENCOUNTER — Ambulatory Visit: Payer: Medicare HMO | Admitting: Family Medicine

## 2023-02-24 ENCOUNTER — Ambulatory Visit (INDEPENDENT_AMBULATORY_CARE_PROVIDER_SITE_OTHER): Payer: Medicare HMO

## 2023-02-24 ENCOUNTER — Encounter: Payer: Self-pay | Admitting: Family Medicine

## 2023-02-24 VITALS — BP 132/72 | HR 75 | Ht 67.0 in | Wt 179.0 lb

## 2023-02-24 DIAGNOSIS — M25521 Pain in right elbow: Secondary | ICD-10-CM | POA: Diagnosis not present

## 2023-02-24 DIAGNOSIS — M1711 Unilateral primary osteoarthritis, right knee: Secondary | ICD-10-CM | POA: Diagnosis not present

## 2023-02-24 DIAGNOSIS — M19021 Primary osteoarthritis, right elbow: Secondary | ICD-10-CM | POA: Diagnosis not present

## 2023-02-24 NOTE — Assessment & Plan Note (Signed)
Patient at the end of the exam did discuss right elbow pain.  States that he did have an injury when he was in high school.  Does have some limited range of motion in all planes and would like x-rays to further evaluate.  Depending on findings then we will discuss different treatment options.  Patient is in agreement with the plan.

## 2023-02-24 NOTE — Patient Instructions (Addendum)
Injected knee today Xray today Thumbs up with lifting Limit ROM See me in 3 months

## 2023-02-24 NOTE — Assessment & Plan Note (Addendum)
Injection given, discussed home exercises discussed brqace, maybe visco  Rtc in 8-12 weeks

## 2023-03-11 ENCOUNTER — Ambulatory Visit (INDEPENDENT_AMBULATORY_CARE_PROVIDER_SITE_OTHER): Payer: Medicare HMO | Admitting: Internal Medicine

## 2023-03-11 ENCOUNTER — Encounter: Payer: Self-pay | Admitting: Internal Medicine

## 2023-03-11 ENCOUNTER — Encounter: Payer: Self-pay | Admitting: Family Medicine

## 2023-03-11 VITALS — BP 128/74 | HR 66 | Temp 97.6°F | Ht 67.0 in | Wt 174.2 lb

## 2023-03-11 DIAGNOSIS — M549 Dorsalgia, unspecified: Secondary | ICD-10-CM

## 2023-03-11 DIAGNOSIS — C61 Malignant neoplasm of prostate: Secondary | ICD-10-CM | POA: Diagnosis not present

## 2023-03-11 DIAGNOSIS — E785 Hyperlipidemia, unspecified: Secondary | ICD-10-CM

## 2023-03-11 DIAGNOSIS — F4321 Adjustment disorder with depressed mood: Secondary | ICD-10-CM | POA: Diagnosis not present

## 2023-03-11 LAB — LIPID PANEL
Cholesterol: 149 mg/dL (ref 0–200)
HDL: 40.8 mg/dL (ref >39.00)
LDL Cholesterol: 95 mg/dL (ref 0–99)
NonHDL: 107.78
Total CHOL/HDL Ratio: 4
Triglycerides: 65 mg/dL (ref 0.0–149.0)
VLDL: 13 mg/dL (ref 0.0–40.0)

## 2023-03-11 LAB — COMPREHENSIVE METABOLIC PANEL
ALT: 22 U/L (ref 0–53)
AST: 20 U/L (ref 0–37)
Albumin: 3.9 g/dL (ref 3.5–5.2)
Alkaline Phosphatase: 67 U/L (ref 39–117)
BUN: 17 mg/dL (ref 6–23)
CO2: 27 meq/L (ref 19–32)
Calcium: 8.7 mg/dL (ref 8.4–10.5)
Chloride: 106 meq/L (ref 96–112)
Creatinine, Ser: 0.98 mg/dL (ref 0.40–1.50)
GFR: 71.64 mL/min (ref >60.00)
Glucose, Bld: 92 mg/dL (ref 70–99)
Potassium: 4.2 meq/L (ref 3.5–5.1)
Sodium: 140 meq/L (ref 135–145)
Total Bilirubin: 0.9 mg/dL (ref 0.2–1.2)
Total Protein: 6.5 g/dL (ref 6.0–8.3)

## 2023-03-11 LAB — PSA: PSA: 0.3 ng/mL (ref 0.10–4.00)

## 2023-03-11 NOTE — Progress Notes (Signed)
 Subjective:  Patient ID: Jeremy [name], male    DOB: August 12, 1940  Age: 83 y.o. MRN: [id number]  CC: No chief complaint on file.   HPI Jeremy [name] presents for grieving - seeing a counselor   Outpatient Medications Prior to Visit  Medication Sig Dispense Refill   azelastine (ASTELIN) 0.1 % nasal spray Place 2 sprays into both nostrils 2 (two) times daily. 30 mL 5   Cholecalciferol (VITAMIN D3) 50 MCG (2000 UT) capsule Take 1 capsule (2,000 Units total) by mouth daily. 100 capsule 3   fluticasone (FLONASE) 50 MCG/ACT nasal spray Place 2 sprays into both nostrils daily as needed for allergies or rhinitis. 16 g 5   montelukast (SINGULAIR) 10 MG tablet Take 1 tablet (10 mg total) by mouth at bedtime. 30 tablet 5   No facility-administered medications prior to visit.    ROS: Review of Systems  Constitutional:  Negative for appetite change, fatigue and unexpected weight change.  HENT:  Negative for congestion, nosebleeds, sneezing, sore throat and trouble swallowing.   Eyes:  Negative for itching and visual disturbance.  Respiratory:  Negative for cough.   Cardiovascular:  Negative for chest pain, palpitations and leg swelling.  Gastrointestinal:  Negative for abdominal distention, blood in stool, diarrhea and nausea.  Genitourinary:  Negative for frequency and hematuria.  Musculoskeletal:  Positive for back pain. Negative for gait problem, joint swelling and neck pain.  Skin:  Negative for rash.  Neurological:  Negative for dizziness, tremors, speech difficulty and weakness.  Psychiatric/Behavioral:  Negative for agitation, dysphoric mood, sleep disturbance and suicidal ideas. The patient is not nervous/anxious.     Objective:  BP 128/74 (BP Location: Left Arm, Patient Position: Sitting)   Pulse 66   Temp 97.6 F (36.4 C) (Temporal)   Ht 5\' 7"  (1.702 m)   Wt 174 lb 3.2 oz (79 kg)   SpO2 99%   BMI 27.28 kg/m   BP Readings from Last 3 Encounters:  03/11/23 128/74   02/24/23 132/72  02/18/23 (!) 142/80    Wt Readings from Last 3 Encounters:  03/11/23 174 lb 3.2 oz (79 kg)  02/24/23 179 lb (81.2 kg)  02/18/23 181 lb 12.8 oz (82.5 kg)    Physical Exam Constitutional:      General: He is not in acute distress.    Appearance: Normal appearance. He is well-developed.     Comments: NAD  Eyes:     Conjunctiva/sclera: Conjunctivae normal.     Pupils: Pupils are equal, round, and reactive to light.  Neck:     Thyroid: No thyromegaly.     Vascular: No JVD.  Cardiovascular:     Rate and Rhythm: Normal rate and regular rhythm.     Heart sounds: Normal heart sounds. No murmur heard.    No friction rub. No gallop.  Pulmonary:     Effort: Pulmonary effort is normal. No respiratory distress.     Breath sounds: Normal breath sounds. No wheezing or rales.  Chest:     Chest wall: No tenderness.  Abdominal:     General: Bowel sounds are normal. There is no distension.     Palpations: Abdomen is soft. There is no mass.     Tenderness: There is no abdominal tenderness. There is no guarding or rebound.  Musculoskeletal:        General: No tenderness. Normal range of motion.     Cervical back: Normal range of motion.  Lymphadenopathy:     Cervical: No  cervical adenopathy.  Skin:    General: Skin is warm and dry.     Findings: No rash.  Neurological:     Mental Status: He is alert and oriented to person, place, and time.     Cranial Nerves: No cranial nerve deficit.     Motor: No abnormal muscle tone.     Coordination: Coordination normal.     Gait: Gait normal.     Deep Tendon Reflexes: Reflexes are normal and symmetric.  Psychiatric:        Behavior: Behavior normal.        Thought Content: Thought content normal.        Judgment: Judgment normal.     Lab Results  Component Value Date   WBC 4.8 09/07/2022   HGB 15.2 09/07/2022   HCT 45.0 09/07/2022   PLT 200.0 09/07/2022   GLUCOSE 96 09/07/2022   CHOL 157 09/07/2022   TRIG 72.0  09/07/2022   HDL 37.50 (L) 09/07/2022   LDLCALC 105 (H) 09/07/2022   ALT 18 09/07/2022   AST 18 09/07/2022   NA 139 09/07/2022   K 4.5 09/07/2022   CL 105 09/07/2022   CREATININE 0.77 09/07/2022   BUN 17 09/07/2022   CO2 27 09/07/2022   TSH 1.58 09/07/2022   PSA 0.21 09/07/2022   INR 1.0 ratio 03/27/2008    No results found.  Assessment & Plan:   Problem List Items Addressed This Visit     Dyslipidemia   CT ca scoring nl "0" score, no CAD 12/19 On diet      Relevant Orders   Comprehensive metabolic panel   Lipid panel   Musculoskeletal back pain   Doing better DEXA scan q 2 years      Relevant Orders   PSA   Grief - Primary   Wife died in 2022/06/22- was married for 60 years Jeremy [name] has been coping.  The family is very supportive. Gym, golf Not taking any meds      Prostate cancer (HCC)   Relevant Orders   PSA      No orders of the defined types were placed in this encounter.     Follow-up: Return in about 6 months (around 09/08/2023) for Wellness Exam.  Sonda Primes, MD

## 2023-03-11 NOTE — Assessment & Plan Note (Signed)
CT ca scoring nl "0" score, no CAD 12/19 On diet

## 2023-03-11 NOTE — Assessment & Plan Note (Addendum)
 Wife died in June 2024 - was married for 60 years Jeremy [name] has been coping.  The family is very supportive. Gym, golf Not taking any meds

## 2023-03-11 NOTE — Assessment & Plan Note (Signed)
 Doing better DEXA scan q 2 years

## 2023-03-16 ENCOUNTER — Encounter: Payer: Self-pay | Admitting: Internal Medicine

## 2023-05-05 DIAGNOSIS — L570 Actinic keratosis: Secondary | ICD-10-CM | POA: Diagnosis not present

## 2023-05-05 DIAGNOSIS — D225 Melanocytic nevi of trunk: Secondary | ICD-10-CM | POA: Diagnosis not present

## 2023-05-05 DIAGNOSIS — L578 Other skin changes due to chronic exposure to nonionizing radiation: Secondary | ICD-10-CM | POA: Diagnosis not present

## 2023-05-05 DIAGNOSIS — L821 Other seborrheic keratosis: Secondary | ICD-10-CM | POA: Diagnosis not present

## 2023-05-05 DIAGNOSIS — L814 Other melanin hyperpigmentation: Secondary | ICD-10-CM | POA: Diagnosis not present

## 2023-05-27 ENCOUNTER — Telehealth: Payer: Self-pay

## 2023-05-27 NOTE — Telephone Encounter (Signed)
 Patient ran for Durolane for right knee. 05/27/2023. Case #: [id number]

## 2023-05-27 NOTE — Progress Notes (Unsigned)
 [facility] [address] [place] [address] Phone: [phone] Subjective:   Delwyn Filippo, am serving as a scribe for Dr. Ronnell Coins.  I'm seeing this patient by the request  of:  Plotnikov, Oakley Bellman, MD  CC: Knee pain, right elbow pain  NGE:[id number]  02/24/2023 Patient at the end of the exam did discuss right elbow pain.  States that he did have an injury when he was in high school.  Does have some limited range of motion in all planes and would like x-rays to further evaluate.  Depending on findings then we will discuss different treatment options.  Patient is in agreement with the plan.     Injection given, discussed home exercises discussed brqace, maybe visco  Rtc in 8-12 weeks         Update 05/28/2023 TERELL STITELER is a 83 y.o. male coming in with complaint of L knee and R elbow pain. Patient states that last week has developed anterior knee pain over patella. Painful to golf, walk on treadmill and walk downhill.   Would like to discuss xray for R elbow. Unable to fully extend elbow.        Past Medical History:  Diagnosis Date   AK (actinic keratosis)    Allergic rhinitis    History of colonic polyps    Dr. Howard Macho- Due 2018   History of skin cancer    Osteopenia    Prostate cancer (HCC)    hx of s/p XRT, seeds Dr. Bosie Bye   Past Surgical History:  Procedure Laterality Date   INSERTION PROSTATE RADIATION SEED     KNEE ARTHROSCOPY Right    KNEE SURGERY Left    TONSILLECTOMY     XRT  2008   Social History   Socioeconomic History   Marital status: Widowed    Spouse name: Not on file   Number of children: 2   Years of education: Not on file   Highest education level: Some college, no degree  Occupational History   Occupation: retired from Airline pilot  Tobacco Use   Smoking status: Some Days    Types: Cigars   Smokeless tobacco: Never   Tobacco comments:    2 cigars a month  Vaping Use   Vaping status:  Never Used  Substance and Sexual Activity   Alcohol use: Yes    Alcohol/week: 2.0 standard drinks of alcohol    Types: 2 Cans of beer per week   Drug use: No   Sexual activity: Yes  Other Topics Concern   Not on file  Social History Narrative   Lives alone-2025   Social Drivers of Health   Financial Resource Strain: Low Risk  (02/17/2023)   Overall Financial Resource Strain (CARDIA)    Difficulty of Paying Living Expenses: Not hard at all  Food Insecurity: No Food Insecurity (02/17/2023)   Hunger Vital Sign    Worried About Running Out of Food in the Last Year: Never true    Ran Out of Food in the Last Year: Never true  Transportation Needs: No Transportation Needs (02/17/2023)   PRAPARE - Administrator, Civil Service (Medical): No    Lack of Transportation (Non-Medical): No  Physical Activity: Sufficiently Active (02/17/2023)   Exercise Vital Sign    Days of Exercise per Week: 3 days    Minutes of Exercise per Session: 150+ min  Stress: No Stress Concern Present (02/17/2023)   [employer] of Occupational Health -  Occupational Stress Questionnaire    Feeling of Stress : Only a little  Social Connections: Moderately Integrated (02/17/2023)   Social Connection and Isolation Panel [NHANES]    Frequency of Communication with Friends and Family: More than three times a week    Frequency of Social Gatherings with Friends and Family: More than three times a week    Attends Religious Services: More than 4 times per year    Active Member of [employer] or Organizations: Yes    Attends Banker Meetings: More than 4 times per year    Marital Status: Widowed   Allergies  Allergen Reactions   Aspirin     REACTION: gi   Family History  Problem Relation Age of Onset   Liver disease Father    Cancer Father        pancreas ?   Cancer - Other Other        intestinal cancer      Current Outpatient Medications (Respiratory):    azelastine  (ASTELIN ) 0.1 % nasal  spray, Place 2 sprays into both nostrils 2 (two) times daily.   fluticasone  (FLONASE ) 50 MCG/ACT nasal spray, Place 2 sprays into both nostrils daily as needed for allergies or rhinitis.   montelukast  (SINGULAIR ) 10 MG tablet, Take 1 tablet (10 mg total) by mouth at bedtime.    Current Outpatient Medications (Other):    Cholecalciferol (VITAMIN D3) 50 MCG (2000 UT) capsule, Take 1 capsule (2,000 Units total) by mouth daily.   Reviewed prior external information including notes and imaging from  primary care provider As well as notes that were available from care everywhere and other healthcare systems.  Past medical history, social, surgical and family history all reviewed in electronic medical record.  No pertanent information unless stated regarding to the chief complaint.   Review of Systems:  No headache, visual changes, nausea, vomiting, diarrhea, constipation, dizziness, abdominal pain, skin rash, fevers, chills, night sweats, weight loss, swollen lymph nodes, body aches, joint swelling, chest pain, shortness of breath, mood changes. POSITIVE muscle aches  Objective  Blood pressure 138/82, pulse 86, height 5\' 7"  (1.702 m), weight 178 lb (80.7 kg), SpO2 94%.   General: No apparent distress alert and oriented x3 mood and affect normal, dressed appropriately.  HEENT: Pupils equal, extraocular movements intact  Respiratory: Patient's speak in full sentences and does not appear short of breath  Cardiovascular: No lower extremity edema, non tender, no erythema  Still has some limited range of motion of the right elbow.  Lacks last 5 degrees of extension.  Good grip strength noted.  Neurovascular intact distally.  Patient's left knee does have some arthritic changes noted mostly in the medial compartment.  Trace effusion noted of the patellofemoral joint noted.    Impression and Recommendations:    The above documentation has been reviewed and is accurate and complete Jodiann Ognibene M Ebubechukwu Jedlicka,  DO

## 2023-05-28 ENCOUNTER — Ambulatory Visit: Payer: Medicare HMO | Admitting: Family Medicine

## 2023-05-28 ENCOUNTER — Encounter: Payer: Self-pay | Admitting: Family Medicine

## 2023-05-28 ENCOUNTER — Telehealth: Payer: Self-pay

## 2023-05-28 VITALS — BP 138/82 | HR 86 | Ht 67.0 in | Wt 178.0 lb

## 2023-05-28 DIAGNOSIS — M1712 Unilateral primary osteoarthritis, left knee: Secondary | ICD-10-CM | POA: Diagnosis not present

## 2023-05-28 DIAGNOSIS — M25521 Pain in right elbow: Secondary | ICD-10-CM

## 2023-05-28 NOTE — Telephone Encounter (Signed)
 Added to follow up visit appointment note. Patient was doing well at visit today and may not need at follow up.

## 2023-05-28 NOTE — Assessment & Plan Note (Signed)
 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  Discussed different treatment options and patient has elected to continue with conservative therapy.  Worsening pain we will consider potential injections.  Discussed icing regimen and home exercises otherwise.  Increase activity slowly.  Will follow-up again in 6 to 8 weeks

## 2023-05-28 NOTE — Patient Instructions (Signed)
 Ice when you need it Shorter strides when going down hill See me in 3 months

## 2023-05-28 NOTE — Telephone Encounter (Signed)
 Patient is being seen in office today. Medication will need to be ordered for next visits.  Durolane approved for right knee. Patient has a Fully [employer] advantage PPO plan with an effective date of 01/13/2020. Plan follows Medicare/ Aetna guidelines. Plan runs on a calendar year. Plan covers at 80% of allowable amount for DUROLANE [id number] and 100% of allowable amount for procedure 20610/20611. Deductibles do not apply to these services. Patient has a $30 copay whether or not an office visit is billed. Only one copay applies per date of service. If out of pocket is met, coverage goes to 100% and copay will no longer apply. No pre-cert and No referrals needed. Medical notes may be requested at the time of claims processing.  Case #: [id number] Exp: 11/27/2023

## 2023-05-28 NOTE — Telephone Encounter (Signed)
 Durolane has been approved.

## 2023-08-20 ENCOUNTER — Ambulatory Visit: Admitting: Family Medicine

## 2023-09-15 ENCOUNTER — Ambulatory Visit (INDEPENDENT_AMBULATORY_CARE_PROVIDER_SITE_OTHER): Payer: Medicare HMO | Admitting: Internal Medicine

## 2023-09-15 ENCOUNTER — Encounter: Payer: Self-pay | Admitting: Internal Medicine

## 2023-09-15 VITALS — BP 151/82 | HR 59 | Temp 97.5°F | Ht 67.0 in | Wt 176.0 lb

## 2023-09-15 DIAGNOSIS — H10022 Other mucopurulent conjunctivitis, left eye: Secondary | ICD-10-CM | POA: Diagnosis not present

## 2023-09-15 DIAGNOSIS — F4321 Adjustment disorder with depressed mood: Secondary | ICD-10-CM | POA: Diagnosis not present

## 2023-09-15 DIAGNOSIS — H10029 Other mucopurulent conjunctivitis, unspecified eye: Secondary | ICD-10-CM | POA: Insufficient documentation

## 2023-09-15 DIAGNOSIS — Z125 Encounter for screening for malignant neoplasm of prostate: Secondary | ICD-10-CM | POA: Diagnosis not present

## 2023-09-15 DIAGNOSIS — G8929 Other chronic pain: Secondary | ICD-10-CM

## 2023-09-15 DIAGNOSIS — M545 Low back pain, unspecified: Secondary | ICD-10-CM | POA: Diagnosis not present

## 2023-09-15 DIAGNOSIS — Z1211 Encounter for screening for malignant neoplasm of colon: Secondary | ICD-10-CM | POA: Diagnosis not present

## 2023-09-15 DIAGNOSIS — Z0000 Encounter for general adult medical examination without abnormal findings: Secondary | ICD-10-CM | POA: Diagnosis not present

## 2023-09-15 DIAGNOSIS — M549 Dorsalgia, unspecified: Secondary | ICD-10-CM | POA: Diagnosis not present

## 2023-09-15 LAB — CBC WITH DIFFERENTIAL/PLATELET
Basophils Absolute: 0 K/uL (ref 0.0–0.1)
Basophils Relative: 0.6 % (ref 0.0–3.0)
Eosinophils Absolute: 0.2 K/uL (ref 0.0–0.7)
Eosinophils Relative: 3.7 % (ref 0.0–5.0)
HCT: 46.3 % (ref 39.0–52.0)
Hemoglobin: 15.4 g/dL (ref 13.0–17.0)
Lymphocytes Relative: 44.6 % (ref 12.0–46.0)
Lymphs Abs: 2.2 K/uL (ref 0.7–4.0)
MCHC: 33.3 g/dL (ref 30.0–36.0)
MCV: 102.1 fl — ABNORMAL HIGH (ref 78.0–100.0)
Monocytes Absolute: 0.5 K/uL (ref 0.1–1.0)
Monocytes Relative: 10.7 % (ref 3.0–12.0)
Neutro Abs: 2 K/uL (ref 1.4–7.7)
Neutrophils Relative %: 40.4 % — ABNORMAL LOW (ref 43.0–77.0)
Platelets: 183 K/uL (ref 150.0–400.0)
RBC: 4.53 Mil/uL (ref 4.22–5.81)
RDW: 13.5 % (ref 11.5–15.5)
WBC: 4.9 K/uL (ref 4.0–10.5)

## 2023-09-15 LAB — LIPID PANEL
Cholesterol: 152 mg/dL (ref 0–200)
HDL: 41.7 mg/dL (ref >39.00)
LDL Cholesterol: 96 mg/dL (ref 0–99)
NonHDL: 110.64
Total CHOL/HDL Ratio: 4
Triglycerides: 74 mg/dL (ref 0.0–149.0)
VLDL: 14.8 mg/dL (ref 0.0–40.0)

## 2023-09-15 LAB — COMPREHENSIVE METABOLIC PANEL WITH GFR
ALT: 21 U/L (ref 0–53)
AST: 22 U/L (ref 0–37)
Albumin: 4 g/dL (ref 3.5–5.2)
Alkaline Phosphatase: 61 U/L (ref 39–117)
BUN: 16 mg/dL (ref 6–23)
CO2: 29 meq/L (ref 19–32)
Calcium: 9.2 mg/dL (ref 8.4–10.5)
Chloride: 105 meq/L (ref 96–112)
Creatinine, Ser: 0.84 mg/dL (ref 0.40–1.50)
GFR: 80.72 mL/min (ref >60.00)
Glucose, Bld: 93 mg/dL (ref 70–99)
Potassium: 4.5 meq/L (ref 3.5–5.1)
Sodium: 141 meq/L (ref 135–145)
Total Bilirubin: 1 mg/dL (ref 0.2–1.2)
Total Protein: 6.8 g/dL (ref 6.0–8.3)

## 2023-09-15 LAB — URINALYSIS
Bilirubin Urine: NEGATIVE
Ketones, ur: NEGATIVE
Leukocytes,Ua: NEGATIVE
Nitrite: NEGATIVE
Specific Gravity, Urine: 1.015 (ref 1.000–1.030)
Total Protein, Urine: NEGATIVE
Urine Glucose: NEGATIVE
Urobilinogen, UA: 1 (ref 0.0–1.0)
pH: 7.5 (ref 5.0–8.0)

## 2023-09-15 LAB — TSH: TSH: 1.76 u[IU]/mL (ref 0.35–5.50)

## 2023-09-15 LAB — PSA: PSA: 0.31 ng/mL (ref 0.10–4.00)

## 2023-09-15 MED ORDER — POLYMYXIN B-TRIMETHOPRIM 10000-0.1 UNIT/ML-% OP SOLN: 1.0000 [drp] | OPHTHALMIC | 0 refills | Status: AC

## 2023-09-15 NOTE — Progress Notes (Signed)
 Subjective:  Patient ID: Jeremy [name], male    DOB: 1940/09/26  Age: 83 y.o. MRN: [id number]  CC: Annual Exam (Physical; Only concern is watery, itchy, and red eyes started last week)   HPI Jeremy [name] presents for a well exam  Outpatient Medications Prior to Visit  Medication Sig Dispense Refill   azelastine  (ASTELIN ) 0.1 % nasal spray Place 2 sprays into both nostrils 2 (two) times daily. 30 mL 5   Cholecalciferol (VITAMIN D3) 50 MCG (2000 UT) capsule Take 1 capsule (2,000 Units total) by mouth daily. 100 capsule 3   fluticasone  (FLONASE ) 50 MCG/ACT nasal spray Place 2 sprays into both nostrils daily as needed for allergies or rhinitis. 16 g 5   montelukast  (SINGULAIR ) 10 MG tablet Take 1 tablet (10 mg total) by mouth at bedtime. 30 tablet 5   No facility-administered medications prior to visit.    ROS: Review of Systems  Constitutional:  Negative for appetite change, fatigue and unexpected weight change.  HENT:  Negative for congestion, nosebleeds, sneezing, sore throat and trouble swallowing.   Eyes:  Negative for itching and visual disturbance.  Respiratory:  Negative for cough.   Cardiovascular:  Negative for chest pain, palpitations and leg swelling.  Gastrointestinal:  Negative for abdominal distention, blood in stool, diarrhea and nausea.  Genitourinary:  Negative for frequency and hematuria.  Musculoskeletal:  Negative for back pain, gait problem, joint swelling and neck pain.  Skin:  Negative for rash.  Neurological:  Negative for dizziness, tremors, speech difficulty and weakness.  Psychiatric/Behavioral:  Negative for agitation, dysphoric mood, sleep disturbance and suicidal ideas. The patient is not nervous/anxious.     Objective:  BP (!) 151/82   Pulse (!) 59   Temp (!) 97.5 F (36.4 C)   Ht 5' 7 (1.702 m)   Wt 176 lb (79.8 kg)   SpO2 100%   BMI 27.57 kg/m   BP Readings from Last 3 Encounters:  09/15/23 (!) 151/82  05/28/23 138/82   03/11/23 128/74    Wt Readings from Last 3 Encounters:  09/15/23 176 lb (79.8 kg)  05/28/23 178 lb (80.7 kg)  03/11/23 174 lb 3.2 oz (79 kg)    Physical Exam Constitutional:      General: He is not in acute distress.    Appearance: Normal appearance. He is well-developed.     Comments: NAD  Eyes:     Conjunctiva/sclera: Conjunctivae normal.     Pupils: Pupils are equal, round, and reactive to light.  Neck:     Thyroid : No thyromegaly.     Vascular: No JVD.  Cardiovascular:     Rate and Rhythm: Normal rate and regular rhythm.     Heart sounds: Normal heart sounds. No murmur heard.    No friction rub. No gallop.  Pulmonary:     Effort: Pulmonary effort is normal. No respiratory distress.     Breath sounds: Normal breath sounds. No wheezing or rales.  Chest:     Chest wall: No tenderness.  Abdominal:     General: Bowel sounds are normal. There is no distension.     Palpations: Abdomen is soft. There is no mass.     Tenderness: There is no abdominal tenderness. There is no guarding or rebound.  Musculoskeletal:        General: No tenderness. Normal range of motion.     Cervical back: Normal range of motion.  Lymphadenopathy:     Cervical: No cervical adenopathy.  Skin:  General: Skin is warm and dry.     Findings: No rash.  Neurological:     Mental Status: He is alert and oriented to person, place, and time.     Cranial Nerves: No cranial nerve deficit.     Motor: No abnormal muscle tone.     Coordination: Coordination normal.     Gait: Gait normal.     Deep Tendon Reflexes: Reflexes are normal and symmetric.  Psychiatric:        Behavior: Behavior normal.        Thought Content: Thought content normal.        Judgment: Judgment normal.   R eye - red, swollen lower eyelid Rectal deferred  Lab Results  Component Value Date   WBC 4.8 09/07/2022   HGB 15.2 09/07/2022   HCT 45.0 09/07/2022   PLT 200.0 09/07/2022   GLUCOSE 92 03/11/2023   CHOL 149 03/11/2023    TRIG 65.0 03/11/2023   HDL 40.80 03/11/2023   LDLCALC 95 03/11/2023   ALT 22 03/11/2023   AST 20 03/11/2023   NA 140 03/11/2023   K 4.2 03/11/2023   CL 106 03/11/2023   CREATININE 0.98 03/11/2023   BUN 17 03/11/2023   CO2 27 03/11/2023   TSH 1.58 09/07/2022   PSA 0.30 03/11/2023   INR 1.0 ratio 03/27/2008    No results found.  Assessment & Plan:   Problem List Items Addressed This Visit     Chronic low back pain   OA - some back fatigue present      Grief   Wife died in 2022-07-01- was married for 60 years      Musculoskeletal back pain   Doing better DEXA scan q 2 years      Pink eye   Polytrim  gtt  See your ophth      Well adult exam - Primary   Relevant Orders   TSH   Urinalysis   CBC with Differential/Platelet   Lipid panel   PSA   Comprehensive metabolic panel with GFR   Cologuard   Other Visit Diagnoses       Screening for colon cancer       Relevant Orders   Cologuard         Meds ordered this encounter  Medications   trimethoprim -polymyxin b  (POLYTRIM ) ophthalmic solution    Sig: Place 1 drop into the left eye every 4 (four) hours for 7 days.    Dispense:  10 mL    Refill:  0      Follow-up: Return in about 6 months (around 03/14/2024) for a follow-up visit.  Marolyn Noel, MD

## 2023-09-15 NOTE — Assessment & Plan Note (Signed)
 Doing better DEXA scan q 2 years

## 2023-09-15 NOTE — Assessment & Plan Note (Signed)
OA - some "back fatigue" present

## 2023-09-15 NOTE — Assessment & Plan Note (Signed)
Wife died in June 2024 - was married for 60 years

## 2023-09-15 NOTE — Assessment & Plan Note (Addendum)
 Polytrim  gtt  See your ophth

## 2023-09-17 ENCOUNTER — Ambulatory Visit: Payer: Self-pay | Admitting: Internal Medicine

## 2023-09-20 DIAGNOSIS — H00022 Hordeolum internum right lower eyelid: Secondary | ICD-10-CM | POA: Diagnosis not present

## 2023-09-21 DIAGNOSIS — Z1211 Encounter for screening for malignant neoplasm of colon: Secondary | ICD-10-CM | POA: Diagnosis not present

## 2023-09-25 LAB — COLOGUARD: COLOGUARD: NEGATIVE

## 2023-10-13 ENCOUNTER — Ambulatory Visit: Admitting: Internal Medicine

## 2023-10-13 ENCOUNTER — Ambulatory Visit

## 2023-10-14 ENCOUNTER — Ambulatory Visit (INDEPENDENT_AMBULATORY_CARE_PROVIDER_SITE_OTHER)

## 2023-10-14 DIAGNOSIS — Z23 Encounter for immunization: Secondary | ICD-10-CM | POA: Diagnosis not present

## 2023-11-10 DIAGNOSIS — L821 Other seborrheic keratosis: Secondary | ICD-10-CM | POA: Diagnosis not present

## 2023-11-10 DIAGNOSIS — L578 Other skin changes due to chronic exposure to nonionizing radiation: Secondary | ICD-10-CM | POA: Diagnosis not present

## 2023-11-10 DIAGNOSIS — L570 Actinic keratosis: Secondary | ICD-10-CM | POA: Diagnosis not present

## 2023-11-10 DIAGNOSIS — L814 Other melanin hyperpigmentation: Secondary | ICD-10-CM | POA: Diagnosis not present

## 2023-11-10 DIAGNOSIS — D229 Melanocytic nevi, unspecified: Secondary | ICD-10-CM | POA: Diagnosis not present

## 2023-11-23 ENCOUNTER — Ambulatory Visit: Admitting: Family Medicine

## 2023-12-23 NOTE — Progress Notes (Unsigned)
 [name] [address] [place] [address] Phone: [phone] Subjective:   Jeremy [name] am a scribe for Dr. Claudene.   I'm seeing this patient by the request  of:  Plotnikov, Jeremy GAILS, MD  CC: Right and left knee pain  YEP:Dlagzrupcz  05/28/2023 Found to have significant arthritic changes noted of the elbow.  Discussed different treatment options and patient has elected to continue with conservative therapy.  Worsening pain we will consider potential injections.  Discussed icing regimen and home exercises otherwise.  Increase activity slowly.  Will follow-up again in 6 to 8 weeks     Updated 12/27/2023 Jeremy [name] is a 83 y.o. male coming in with complaint of R knee pain. Patient states he has to talk to the doctor about the gel injection. Overall the knee id ok but the pain comes and goes.        Past Medical History:  Diagnosis Date   AK (actinic keratosis)    Allergic rhinitis    History of colonic polyps    Dr. Teressa- Due 2018   History of skin cancer    Osteopenia    Prostate cancer (HCC)    hx of s/p XRT, seeds Dr. Alline   Past Surgical History:  Procedure Laterality Date   INSERTION PROSTATE RADIATION SEED     KNEE ARTHROSCOPY Right    KNEE SURGERY Left    TONSILLECTOMY     XRT  2008   Social History   Socioeconomic History   Marital status: Widowed    Spouse name: Not on file   Number of children: 2   Years of education: Not on file   Highest education level: Some college, no degree  Occupational History   Occupation: retired from airline pilot  Tobacco Use   Smoking status: Some Days    Types: Cigars   Smokeless tobacco: Never   Tobacco comments:    2 cigars a month  Vaping Use   Vaping status: Never Used  Substance and Sexual Activity   Alcohol use: Yes    Alcohol/week: 2.0 standard drinks of alcohol    Types: 2 Cans of beer per week   Drug use: No   Sexual activity: Yes  Other Topics Concern    Not on file  Social History Narrative   Lives alone-2025   Social Drivers of Health   Tobacco Use: High Risk (09/15/2023)   Patient History    Smoking Tobacco Use: Some Days    Smokeless Tobacco Use: Never    Passive Exposure: Not on file  Financial Resource Strain: Low Risk (12/27/2023)   Overall Financial Resource Strain (CARDIA)    Difficulty of Paying Living Expenses: Not hard at all  Food Insecurity: No Food Insecurity (12/27/2023)   Epic    Worried About Radiation Protection Practitioner of Food in the Last Year: Never true    Ran Out of Food in the Last Year: Never true  Transportation Needs: No Transportation Needs (12/27/2023)   Epic    Lack of Transportation (Medical): No    Lack of Transportation (Non-Medical): No  Physical Activity: Sufficiently Active (12/27/2023)   Exercise Vital Sign    Days of Exercise per Week: 5 days    Minutes of Exercise per Session: 90 min  Stress: No Stress Concern Present (12/27/2023)   [employer] of Occupational Health - Occupational Stress Questionnaire    Feeling of Stress: Not at all  Social Connections: Moderately Integrated (12/27/2023)  Social Connection and Isolation Panel    Frequency of Communication with Friends and Family: More than three times a week    Frequency of Social Gatherings with Friends and Family: More than three times a week    Attends Religious Services: More than 4 times per year    Active Member of [employer] or Organizations: Yes    Attends Banker Meetings: More than 4 times per year    Marital Status: Widowed  Depression (PHQ2-9): Low Risk (02/18/2023)   Depression (PHQ2-9)    PHQ-2 Score: 0  Alcohol Screen: Low Risk (12/27/2023)   Alcohol Screen    Last Alcohol Screening Score (AUDIT): 1  Housing: Low Risk (12/27/2023)   Epic    Unable to Pay for Housing in the Last Year: No    Number of Times Moved in the Last Year: 0    Homeless in the Last Year: No  Utilities: Not At Risk (02/18/2023)   AHC Utilities     Threatened with loss of utilities: No  Health Literacy: Adequate Health Literacy (02/18/2023)   B1300 Health Literacy    Frequency of need for help with medical instructions: Rarely   Allergies[1] Family History  Problem Relation Age of Onset   Liver disease Father    Cancer Father        pancreas ?   Cancer - Other Other        intestinal cancer    Current Outpatient Medications (Respiratory):    azelastine  (ASTELIN ) 0.1 % nasal spray, Place 2 sprays into both nostrils 2 (two) times daily.   fluticasone  (FLONASE ) 50 MCG/ACT nasal spray, Place 2 sprays into both nostrils daily as needed for allergies or rhinitis.   montelukast  (SINGULAIR ) 10 MG tablet, Take 1 tablet (10 mg total) by mouth at bedtime.  Current Outpatient Medications (Other):    Cholecalciferol (VITAMIN D3) 50 MCG (2000 UT) capsule, Take 1 capsule (2,000 Units total) by mouth daily.   Reviewed prior external information including notes and imaging from  primary care provider As well as notes that were available from care everywhere and other healthcare systems.  Past medical history, social, surgical and family history all reviewed in electronic medical record.  No pertanent information unless stated regarding to the chief complaint.   Review of Systems:  No headache, visual changes, nausea, vomiting, diarrhea, constipation, dizziness, abdominal pain, skin rash, fevers, chills, night sweats, weight loss, swollen lymph nodes, body aches, joint swelling, chest pain, shortness of breath, mood changes. POSITIVE muscle aches  Objective  Blood pressure 126/70, pulse 77, height 5' 7 (1.702 m), SpO2 97%.   General: No apparent distress alert and oriented x3 mood and affect normal, dressed appropriately.  HEENT: Pupils equal, extraocular movements intact  Respiratory: Patient's speak in full sentences and does not appear short of breath  Cardiovascular: No lower extremity edema, non tender, no erythema  Knee arthritic  changes noted.  Crepitus noted.  Patient has some mild instability but seems to be mostly of the patellofemoral joint right greater than left.    Impression and Recommendations:    The above documentation has been reviewed and is accurate and complete Arthea CHRISTELLA Sharps, DO        [1]  Allergies Allergen Reactions   Aspirin     REACTION: gi

## 2023-12-24 ENCOUNTER — Ambulatory Visit: Payer: Self-pay

## 2023-12-24 NOTE — Telephone Encounter (Signed)
 FYI Only or Action Required?: FYI only for provider: appointment scheduled on 12/27/23.  Patient was last seen in primary care on 09/15/2023 by Plotnikov, Jeremy GAILS, MD.  Called Nurse Triage reporting Back Pain.  Symptoms began a year ago but recent flare up x 1 week.  Interventions attempted: OTC medications: Tylenol and Icy Hot.  Symptoms are: gradually worsening.  Triage Disposition: See PCP When Office is Open (Within 3 Days)  Patient/caregiver understands and will follow disposition?: Yes                    Message from [name] sent at 12/24/2023  1:09 PM EST  Reason for Triage: Pt is wanting appt soon as possible with Dr. Garald ([facility]) due to worsening lower back pain. He has spoken with provider about this problem before, but says as of about a week ago the pain is increasing. Requesting call at [phone]   Reason for Disposition  [1] MODERATE back pain (e.g., interferes with normal activities) AND [2] present > 3 days  Answer Assessment - Initial Assessment Questions 1. ONSET: When did the pain begin? (e.g., minutes, hours, days)     Worsening x1 week. 2. LOCATION: Where does it hurt? (upper, mid or lower back)     Lower. 3. SEVERITY: How bad is the pain?  (e.g., Scale 1-10; mild, moderate, or severe)     6-7/10. 4. PATTERN: Is the pain constant? (e.g., yes, no; constant, intermittent)      Constant, worse when bending over or lying down and going to sitting or standing position. 5. RADIATION: Does the pain shoot into your legs or somewhere else?     No. 6. CAUSE:  What do you think is causing the back pain?      Chronic issue from a fall, seen over a year ago for lower back pain. 7. BACK OVERUSE:  Any recent lifting of heavy objects, strenuous work or exercise?     No, he states he thinks this was aggravated by driving for 5 hours and then sleeping in an uncomfortable position and bed. 8. MEDICINES: What have you taken so far for  the pain? (e.g., nothing, acetaminophen, NSAIDS)     Icy Hot, Tylenol.  9. NEUROLOGIC SYMPTOMS: Do you have any weakness, numbness, or problems with bowel/bladder control?     No.  10. OTHER SYMPTOMS: Do you have any other symptoms? (e.g., fever, abdomen pain, burning with urination, blood in urine)       No LOC, severe abdominal pain or abdominal pain, blood in urine, radiating pain, fever, numbness or tingling in arms/hands/legs/feet, rash.  Protocols used: Back Pain-A-AH

## 2023-12-27 ENCOUNTER — Ambulatory Visit: Admitting: Internal Medicine

## 2023-12-27 ENCOUNTER — Ambulatory Visit: Admitting: Family Medicine

## 2023-12-27 VITALS — BP 134/78 | HR 78 | Temp 97.7°F | Ht 67.0 in | Wt 180.0 lb

## 2023-12-27 VITALS — BP 126/70 | HR 77 | Ht 67.0 in

## 2023-12-27 DIAGNOSIS — R030 Elevated blood-pressure reading, without diagnosis of hypertension: Secondary | ICD-10-CM | POA: Diagnosis not present

## 2023-12-27 DIAGNOSIS — M545 Low back pain, unspecified: Secondary | ICD-10-CM | POA: Diagnosis not present

## 2023-12-27 DIAGNOSIS — M1712 Unilateral primary osteoarthritis, left knee: Secondary | ICD-10-CM | POA: Diagnosis not present

## 2023-12-27 MED ORDER — TRAMADOL HCL 50 MG PO TABS: 50.0000 mg | ORAL_TABLET | Freq: Four times a day (QID) | ORAL | 0 refills | Status: DC | PRN

## 2023-12-27 MED ORDER — PREDNISONE 10 MG PO TABS: ORAL_TABLET | ORAL | 0 refills | Status: AC

## 2023-12-27 NOTE — Progress Notes (Signed)
 Patient ID: Jeremy [name], male   DOB: 20-Oct-1940, 83 y.o.   MRN: [id number]        Chief Complaint: follow up acute right low back pain       HPI:  Jeremy [name] is a 83 y.o. male here with above x 2 yrs mild intermittent at worst, but now constant, moderated and persistent x 2 wks, has to hold on to rails when walking on the treadmill, pt can swing a golf club without significant pain, but sitting up from lying position makes worse. No leg pain , numbness or weakness.   Did drive 5 hrs each way recently but not sure is related to the pain.  He has long hx of driving in the past, and has a seat cushion.  Pt denies chest pain, increased sob or doe, wheezing, orthopnea, PND, increased LE swelling, palpitations, dizziness or syncope.   Pt denies polydipsia, polyuria, or new focal neuro s/s.  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  Does have hx of lumbar compression fx, no recent fall.        Wt Readings from Last 3 Encounters:  12/27/23 180 lb (81.6 kg)  09/15/23 176 lb (79.8 kg)  05/28/23 178 lb (80.7 kg)   BP Readings from Last 3 Encounters:  12/27/23 134/78  12/27/23 126/70  09/15/23 (!) 151/82         Past Medical History:  Diagnosis Date   AK (actinic keratosis)    Allergic rhinitis    Allergy    Arthritis    History of colonic polyps    Dr. Teressa- Due 2018   History of skin cancer    Osteopenia    Prostate cancer (HCC)    hx of s/p XRT, seeds Dr. Alline   Past Surgical History:  Procedure Laterality Date   EYE SURGERY     INSERTION PROSTATE RADIATION SEED     KNEE ARTHROSCOPY Right    KNEE SURGERY Left    TONSILLECTOMY     XRT  01/12/2006    reports that he has been smoking cigars. He has never used smokeless tobacco. He reports current alcohol use of about 2.0 standard drinks of alcohol per week. He reports that he does not use drugs. family history includes Cancer in his father; Cancer - Other in an other family member; Liver disease in his  father. Allergies[1] Medications Ordered Prior to Encounter[2]      ROS:  All others reviewed and negative.  Objective        PE:  BP 134/78   Pulse 78   Temp 97.7 F (36.5 C)   Ht 5' 7 (1.702 m)   Wt 180 lb (81.6 kg)   SpO2 98%   BMI 28.19 kg/m                 Constitutional: Pt appears in NAD               HENT: Head: NCAT.                Right Ear: External ear normal.                 Left Ear: External ear normal.                Eyes: . Pupils are equal, round, and reactive to light. Conjunctivae and EOM are normal               Nose: without  d/c or deformity               Neck: Neck supple. Gross normal ROM               Cardiovascular: Normal rate and regular rhythm.                 Pulmonary/Chest: Effort normal and breath sounds without rales or wheezing.                Abd:  Soft, NT, ND, + BS, no organomegaly               Neurological: Pt is alert. At baseline orientation, motor grossly intact, mod tender right lumbar paraspinal tender; spine nontender in midline, does have right low lumbar paraspinal tender moderate               Skin: Skin is warm. No rashes, no other new lesions, LE edema - none               Psychiatric: Pt behavior is normal without agitation   Micro: none  Cardiac tracings I have personally interpreted today:  none  Pertinent Radiological findings (summarize): none   Lab Results  Component Value Date   WBC 4.9 09/15/2023   HGB 15.4 09/15/2023   HCT 46.3 09/15/2023   PLT 183.0 09/15/2023   GLUCOSE 93 09/15/2023   CHOL 152 09/15/2023   TRIG 74.0 09/15/2023   HDL 41.70 09/15/2023   LDLCALC 96 09/15/2023   ALT 21 09/15/2023   AST 22 09/15/2023   NA 141 09/15/2023   K 4.5 09/15/2023   CL 105 09/15/2023   CREATININE 0.84 09/15/2023   BUN 16 09/15/2023   CO2 29 09/15/2023   TSH 1.76 09/15/2023   PSA 0.31 09/15/2023   INR 1.0 ratio 03/27/2008   Assessment/Plan:  Jeremy [name] is a 83 y.o. White or Caucasian [1] male with   has a past medical history of AK (actinic keratosis), Allergic rhinitis, Allergy, Arthritis, History of colonic polyps, History of skin cancer, Osteopenia, and Prostate cancer (HCC).  Low back pain Mild to mod, etiology unclear but neuro exam benign, likely due to msk strain and or lumbar djd ddd - for tramadol  50 mg prn, and prednisone  taper asd,  to f/u any worsening symptoms or concerns as may need consider further imaging if not improved soon   Elevated BP without diagnosis of hypertension BP Readings from Last 3 Encounters:  12/27/23 134/78  12/27/23 126/70  09/15/23 (!) 151/82   Stable, pt to continue medical treatment  - diet, wt control  Followup: Return if symptoms worsen or fail to improve.  Lynwood Rush, MD 12/27/2023 6:29 PM [facility]     [1]  Allergies Allergen Reactions   Aspirin     REACTION: gi  [2]  Current Outpatient Medications on File Prior to Visit  Medication Sig Dispense Refill   azelastine  (ASTELIN ) 0.1 % nasal spray Place 2 sprays into both nostrils 2 (two) times daily. 30 mL 5   Cholecalciferol (VITAMIN D3) 50 MCG (2000 UT) capsule Take 1 capsule (2,000 Units total) by mouth daily. 100 capsule 3   fluticasone  (FLONASE ) 50 MCG/ACT nasal spray Place 2 sprays into both nostrils daily as needed for allergies or rhinitis. 16 g 5   montelukast  (SINGULAIR ) 10 MG tablet Take 1 tablet (10 mg total) by mouth at bedtime. 30 tablet 5   No current  facility-administered medications on file prior to visit.

## 2023-12-27 NOTE — Patient Instructions (Signed)
 Please take all new medication as prescribed - the tramadol  as needed for  pain, and prednisone   Please continue all other medications as before, and refills have been done if requested.  Please have the pharmacy call with any other refills you may need.  Please keep your appointments with your specialists as you may have planned

## 2023-12-27 NOTE — Patient Instructions (Addendum)
 Good to see you. Will get approval for gel in the New Year. See me again in February.

## 2023-12-27 NOTE — Assessment & Plan Note (Signed)
 Mild to mod, etiology unclear but neuro exam benign, likely due to msk strain and or lumbar djd ddd - for tramadol  50 mg prn, and prednisone  taper asd,  to f/u any worsening symptoms or concerns as may need consider further imaging if not improved soon

## 2023-12-27 NOTE — Assessment & Plan Note (Signed)
 BP Readings from Last 3 Encounters:  12/27/23 134/78  12/27/23 126/70  09/15/23 (!) 151/82   Stable, pt to continue medical treatment  - diet, wt control

## 2023-12-27 NOTE — Assessment & Plan Note (Addendum)
 Discussed with patient at great length.  Discussed with patient about the mild to moderate arthritic changes noted of both knees.  We discussed about the possibility of viscosupplementation which patient wants to hold on at this time.  Discussed home exercises and icing regimen.  Increase activity slowly.  Follow-up again in 6 to 12 weeks otherwise. I personally spent a total of 31 minutes in the care of the patient today including preparing to see the patient, getting/reviewing separately obtained history, performing a medically appropriate exam/evaluation, counseling and educating, documenting clinical information in the EHR, and communicating results.

## 2024-01-17 ENCOUNTER — Telehealth: Payer: Self-pay

## 2024-01-17 NOTE — Telephone Encounter (Signed)
 Durolane benefits an for bilateral knee case ID [id number]

## 2024-01-17 NOTE — Telephone Encounter (Signed)
-----   Message from [facility] Trayce J sent at 01/17/2024 10:47 AM EST ----- Regarding: FW: Gel injection  ----- Message ----- From: Desiderio Joesph SAUNDERS Sent: 12/27/2023   2:06 PM EST To: Claretha JONETTA Schimke, CMA Subject: Gel injection                                  Rerun for gel in January please

## 2024-01-21 NOTE — Telephone Encounter (Signed)
 Scheduled 03/17/24  DUROLANE authorized bilateral knee NO PRE CERT REQUIRED  Patient responsible for 20% coinsurance Copay $30 Deductible does not apply OOP MAX $5900 has met $0 Once OOP has been met coverage goes to 100% and copay will no longer apply Reference # [id number]

## 2024-01-24 NOTE — Telephone Encounter (Signed)
 Noted

## 2024-01-28 ENCOUNTER — Ambulatory Visit: Payer: Self-pay

## 2024-01-28 NOTE — Telephone Encounter (Signed)
 FYI Only or Action Required?: FYI only for provider: appointment scheduled on 1/19.  Patient was last seen in primary care on 12/27/2023 by Norleen Lynwood ORN, MD.  Called Nurse Triage reporting Back Pain.  Symptoms began several weeks ago.  Interventions attempted: OTC medications: tylenol, Prescription medications: tramadol  and prednisone , Ice/heat application, and Other: PCP 12/15.  Symptoms are: gradually worsening.  Triage Disposition: See PCP When Office is Open (Within 3 Days)  Patient/caregiver understands and will follow disposition?: Yes       Reason for Disposition  [1] MODERATE back pain (e.g., interferes with normal activities) AND [2] present > 3 days  Answer Assessment - Initial Assessment Questions This RN recommended pt be examined in next 3 days, scheduled for 1/19 with PCP office. Advised call back or seek immediate care if new or worsening symptoms.      1. ONSET: When did the pain begin? (e.g., minutes, hours, days)     Few weeks ago  2. LOCATION: Where does it hurt? (upper, mid or lower back)     Right lower back, sometimes all across  3. SEVERITY: How bad is the pain?  (e.g., Scale 1-10; mild, moderate, or severe)     6-7/10 regardless of rest or activity, but tends to be worse when go to gym or try to play golf Was seen at PCP 12/15 and given meds but no lasting improvement  5. RADIATION: Does the pain shoot into your legs or somewhere else?     Denies  8. MEDICINES: What have you taken so far for the pain? (e.g., nothing, acetaminophen, NSAIDS)     Med seemed to help a little but not a lot, stopped taking it, back to where it was now Just started taking tramadol  again last couple days, helps a little with tylenol  9. NEUROLOGIC SYMPTOMS: Do you have any weakness, numbness, or problems with bowel/bladder control?     Denies Been able to continue walking 2 miles at a time but back pain with it now when usually don't  10. OTHER  SYMPTOMS: Do you have any other symptoms? (e.g., fever, abdomen pain, burning with urination, blood in urine)       Some knee pain ongoing - picking up left leg with hands to get in car only for comfort when been real active like at gym  Denies: Changes to consciousness Weakness Chest pain Abdominal pain Upper back pain Weakness to one leg/foot  Protocols used: Back Pain-A-AH

## 2024-01-31 ENCOUNTER — Encounter: Payer: Self-pay | Admitting: Emergency Medicine

## 2024-01-31 ENCOUNTER — Ambulatory Visit: Admitting: Emergency Medicine

## 2024-01-31 VITALS — BP 120/68 | HR 56 | Temp 97.3°F | Ht 67.0 in | Wt 174.0 lb

## 2024-01-31 DIAGNOSIS — M545 Low back pain, unspecified: Secondary | ICD-10-CM

## 2024-01-31 DIAGNOSIS — M5136 Other intervertebral disc degeneration, lumbar region with discogenic back pain only: Secondary | ICD-10-CM | POA: Insufficient documentation

## 2024-01-31 MED ORDER — TRAMADOL HCL 50 MG PO TABS: 50.0000 mg | ORAL_TABLET | Freq: Three times a day (TID) | ORAL | 1 refills | Status: AC | PRN

## 2024-01-31 NOTE — Patient Instructions (Signed)
 Acute Back Pain, Adult Acute back pain is sudden and usually short-lived. It is often caused by an injury to the muscles and tissues in the back. The injury may result from: A muscle, tendon, or ligament getting overstretched or torn. Ligaments are tissues that connect bones to each other. Lifting something improperly can cause a back strain. Wear and tear (degeneration) of the spinal disks. Spinal disks are circular tissue that provide cushioning between the bones of the spine (vertebrae). Twisting motions, such as while playing sports or doing yard work. A hit to the back. Arthritis. You may have a physical exam, lab tests, and imaging tests to find the cause of your pain. Acute back pain usually goes away with rest and home care. Follow these instructions at home: Managing pain, stiffness, and swelling Take over-the-counter and prescription medicines only as told by your health care provider. Treatment may include medicines for pain and inflammation that are taken by mouth or applied to the skin, or muscle relaxants. Your health care provider may recommend applying ice during the first 24-48 hours after your pain starts. To do this: Put ice in a plastic bag. Place a towel between your skin and the bag. Leave the ice on for 20 minutes, 2-3 times a day. Remove the ice if your skin turns bright red. This is very important. If you cannot feel pain, heat, or cold, you have a greater risk of damage to the area. If directed, apply heat to the affected area as often as told by your health care provider. Use the heat source that your health care provider recommends, such as a moist heat pack or a heating pad. Place a towel between your skin and the heat source. Leave the heat on for 20-30 minutes. Remove the heat if your skin turns bright red. This is especially important if you are unable to feel pain, heat, or cold. You have a greater risk of getting burned. Activity  Do not stay in bed. Staying in  bed for more than 1-2 days can delay your recovery. Sit up and stand up straight. Avoid leaning forward when you sit or hunching over when you stand. If you work at a desk, sit close to it so you do not need to lean over. Keep your chin tucked in. Keep your neck drawn back, and keep your elbows bent at a 90-degree angle (right angle). Sit high and close to the steering wheel when you drive. Add lower back (lumbar) support to your car seat, if needed. Take short walks on even surfaces as soon as you are able. Try to increase the length of time you walk each day. Do not sit, drive, or stand in one place for more than 30 minutes at a time. Sitting or standing for long periods of time can put stress on your back. Do not drive or use heavy machinery while taking prescription pain medicine. Use proper lifting techniques. When you bend and lift, use positions that put less stress on your back: [place] your knees. Keep the load close to your body. Avoid twisting. Exercise regularly as told by your health care provider. Exercising helps your back heal faster and helps prevent back injuries by keeping muscles strong and flexible. Work with a physical therapist to make a safe exercise program, as recommended by your health care provider. Do any exercises as told by your physical therapist. Lifestyle Maintain a healthy weight. Extra weight puts stress on your back and makes it difficult to have good  posture. Avoid activities or situations that make you feel anxious or stressed. Stress and anxiety increase muscle tension and can make back pain worse. Learn ways to manage anxiety and stress, such as through exercise. General instructions Sleep on a firm mattress in a comfortable position. Try lying on your side with your knees slightly bent. If you lie on your back, put a pillow under your knees. Keep your head and neck in a straight line with your spine (neutral position) when using electronic equipment like  smartphones or pads. To do this: Raise your smartphone or pad to look at it instead of bending your head or neck to look down. Put the smartphone or pad at the level of your face while looking at the screen. Follow your treatment plan as told by your health care provider. This may include: Cognitive or behavioral therapy. Acupuncture or massage therapy. Meditation or yoga. Contact a health care provider if: You have pain that is not relieved with rest or medicine. You have increasing pain going down into your legs or buttocks. Your pain does not improve after 2 weeks. You have pain at night. You lose weight without trying. You have a fever or chills. You develop nausea or vomiting. You develop abdominal pain. Get help right away if: You develop new bowel or bladder control problems. You have unusual weakness or numbness in your arms or legs. You feel faint. These symptoms may represent a serious problem that is an emergency. Do not wait to see if the symptoms will go away. Get medical help right away. Call your local emergency services (911 in the [place] Do not drive yourself to the hospital. Summary Acute back pain is sudden and usually short-lived. Use proper lifting techniques. When you bend and lift, use positions that put less stress on your back. Take over-the-counter and prescription medicines only as told by your health care provider, and apply heat or ice as told. This information is not intended to replace advice given to you by your health care provider. Make sure you discuss any questions you have with your health care provider. Document Revised: 03/22/2020 Document Reviewed: 03/22/2020 Elsevier Patient Education  2024 [employer].

## 2024-01-31 NOTE — Assessment & Plan Note (Signed)
 Musculoskeletal and mechanical in nature Clinically stable.  No red flag signs or symptoms. Pain management discussed Recommend Tylenol for mild to moderate pain and tramadol  for moderate to severe pain Recommend orthopedic evaluation.  Referral placed today.

## 2024-01-31 NOTE — Assessment & Plan Note (Signed)
 X-ray report from 11/25/2022 reviewed Which shows multilevel lumbar degenerative changes Needs orthopedic evaluation Referral placed today Pain management discussed

## 2024-01-31 NOTE — Progress Notes (Signed)
 Jeremy [name] 84 y.o.   Chief Complaint  Patient presents with   Back Pain    Pt states that he still works out and does his daily activities but he says that it is bothering him more than usual     HISTORY OF PRESENT ILLNESS: Acute problem visit today This is a 84 y.o. male complaining of lumbar pain for the last 2 weeks.  Constant pain.  Not getting better.  No radiation.  No bowel or bladder symptoms. Has history of degenerative changes of lumbar spine.  Last x-rays done 11/25/2022.  Report reviewed with patient. No other associated symptoms No other complaints or medical concerns today.  Back Pain Pertinent negatives include no abdominal pain, chest pain, dysuria, fever or headaches.     Prior to Admission medications  Medication Sig Start Date End Date Taking? Authorizing Provider  azelastine  (ASTELIN ) 0.1 % nasal spray Place 2 sprays into both nostrils 2 (two) times daily. 12/08/22  Yes Ambs, Arlean HERO, FNP  Cholecalciferol (VITAMIN D3) 50 MCG (2000 UT) capsule Take 1 capsule (2,000 Units total) by mouth daily. 11/30/19  Yes Plotnikov, Karlynn GAILS, MD  fluticasone  (FLONASE ) 50 MCG/ACT nasal spray Place 2 sprays into both nostrils daily as needed for allergies or rhinitis. 12/08/22  Yes Ambs, Arlean HERO, FNP  montelukast  (SINGULAIR ) 10 MG tablet Take 1 tablet (10 mg total) by mouth at bedtime. 10/05/22  Yes Ambs, Arlean HERO, FNP  traMADol  (ULTRAM ) 50 MG tablet Take 1 tablet (50 mg total) by mouth every 6 (six) hours as needed. 12/27/23  Yes Norleen Lynwood ORN, MD  predniSONE  (DELTASONE ) 10 MG tablet 3 tabs by mouth per day for 3 days,2tabs per day for 3 days,1tab per day for 3 days 12/27/23   Norleen Lynwood ORN, MD    Allergies[1]  Patient Active Problem List   Diagnosis Date Noted   Low back pain 12/27/2023   Pink eye 09/15/2023   Right elbow pain 02/24/2023   Grief 09/07/2022   Prostate cancer (HCC)    Lumbar compression fracture (HCC) 12/02/2021   Acute lumbar myofascial strain  11/25/2021   Musculoskeletal back pain 11/25/2021   Patellofemoral arthritis of right knee 07/30/2017   Chronic low back pain 11/12/2016   Seasonal allergic rhinitis due to pollen 09/21/2014   Elevated BP without diagnosis of hypertension 02/19/2014   Primary localized osteoarthrosis, lower leg 03/15/2013   Osteoarthritis of left knee 02/03/2013   Dyslipidemia 10/08/2011   Well adult exam 09/22/2010   CAROTID BRUIT 08/22/2009   SKIN CANCER, HX OF 05/04/2008   History of colonic polyps 05/02/2007   Osteopenia 11/18/2006   PROSTATE CANCER, HX OF 10/14/2006    Past Medical History:  Diagnosis Date   AK (actinic keratosis)    Allergic rhinitis    Allergy    Arthritis    History of colonic polyps    Dr. Teressa- Due 2018   History of skin cancer    Osteopenia    Prostate cancer (HCC)    hx of s/p XRT, seeds Dr. Alline    Past Surgical History:  Procedure Laterality Date   EYE SURGERY     INSERTION PROSTATE RADIATION SEED     KNEE ARTHROSCOPY Right    KNEE SURGERY Left    TONSILLECTOMY     XRT  01/12/2006    Social History   Socioeconomic History   Marital status: Widowed    Spouse name: Not on file   Number of children: 2   Years  of education: Not on file   Highest education level: Some college, no degree  Occupational History   Occupation: retired from airline pilot  Tobacco Use   Smoking status: Some Days    Types: Cigars   Smokeless tobacco: Never   Tobacco comments:    2 cigars a month  Vaping Use   Vaping status: Never Used  Substance and Sexual Activity   Alcohol use: Yes    Alcohol/week: 2.0 standard drinks of alcohol    Types: 2 Cans of beer per week   Drug use: No   Sexual activity: Yes  Other Topics Concern   Not on file  Social History Narrative   Lives alone-2025   Social Drivers of Health   Tobacco Use: High Risk (01/31/2024)   Patient History    Smoking Tobacco Use: Some Days    Smokeless Tobacco Use: Never    Passive Exposure: Not on file   Financial Resource Strain: Low Risk (12/27/2023)   Overall Financial Resource Strain (CARDIA)    Difficulty of Paying Living Expenses: Not hard at all  Food Insecurity: No Food Insecurity (12/27/2023)   Epic    Worried About Radiation Protection Practitioner of Food in the Last Year: Never true    Ran Out of Food in the Last Year: Never true  Transportation Needs: No Transportation Needs (12/27/2023)   Epic    Lack of Transportation (Medical): No    Lack of Transportation (Non-Medical): No  Physical Activity: Sufficiently Active (12/27/2023)   Exercise Vital Sign    Days of Exercise per Week: 5 days    Minutes of Exercise per Session: 90 min  Stress: No Stress Concern Present (12/27/2023)   [employer] of Occupational Health - Occupational Stress Questionnaire    Feeling of Stress: Not at all  Social Connections: Moderately Integrated (12/27/2023)   Social Connection and Isolation Panel    Frequency of Communication with Friends and Family: More than three times a week    Frequency of Social Gatherings with Friends and Family: More than three times a week    Attends Religious Services: More than 4 times per year    Active Member of [employer] or Organizations: Yes    Attends Banker Meetings: More than 4 times per year    Marital Status: Widowed  Intimate Partner Violence: Patient Unable To Answer (02/18/2023)   Humiliation, Afraid, Rape, and Kick questionnaire    Fear of Current or Ex-Partner: Patient unable to answer    Emotionally Abused: Patient unable to answer    Physically Abused: Patient unable to answer    Sexually Abused: Patient unable to answer  Depression (PHQ2-9): Low Risk (02/18/2023)   Depression (PHQ2-9)    PHQ-2 Score: 0  Alcohol Screen: Low Risk (12/27/2023)   Alcohol Screen    Last Alcohol Screening Score (AUDIT): 1  Housing: Low Risk (12/27/2023)   Epic    Unable to Pay for Housing in the Last Year: No    Number of Times Moved in the Last Year: 0    Homeless in  the Last Year: No  Utilities: Not At Risk (02/18/2023)   AHC Utilities    Threatened with loss of utilities: No  Health Literacy: Adequate Health Literacy (02/18/2023)   B1300 Health Literacy    Frequency of need for help with medical instructions: Rarely    Family History  Problem Relation Age of Onset   Liver disease Father    Cancer Father  pancreas ?   Cancer - Other Other        intestinal cancer     Review of Systems  Constitutional: Negative.  Negative for chills and fever.  HENT: Negative.  Negative for congestion and sore throat.   Respiratory: Negative.  Negative for cough and shortness of breath.   Cardiovascular: Negative.  Negative for chest pain and palpitations.  Gastrointestinal:  Negative for abdominal pain, diarrhea, nausea and vomiting.  Genitourinary: Negative.  Negative for dysuria and hematuria.  Musculoskeletal:  Positive for back pain.  Skin: Negative.  Negative for rash.  Neurological: Negative.  Negative for dizziness and headaches.  All other systems reviewed and are negative.   Vitals:   01/31/24 0932  BP: 120/68  Pulse: (!) 56  Temp: (!) 97.3 F (36.3 C)  SpO2: 97%    Physical Exam Vitals reviewed.  Constitutional:      Appearance: Normal appearance.  HENT:     Head: Normocephalic.  Eyes:     Extraocular Movements: Extraocular movements intact.  Cardiovascular:     Rate and Rhythm: Normal rate.  Pulmonary:     Effort: Pulmonary effort is normal.  Abdominal:     Palpations: Abdomen is soft.     Tenderness: There is no abdominal tenderness.  Musculoskeletal:     Lumbar back: Spasms and tenderness present. No bony tenderness. Decreased range of motion.  Skin:    General: Skin is warm and dry.  Neurological:     General: No focal deficit present.     Mental Status: He is alert and oriented to person, place, and time.     Sensory: No sensory deficit.     Motor: No weakness.  Psychiatric:        Mood and Affect: Mood normal.         Behavior: Behavior normal.     ASSESSMENT & PLAN: Problem List Items Addressed This Visit       Musculoskeletal and Integument   Degeneration of intervertebral disc of lumbar region with discogenic back pain   X-ray report from 11/25/2022 reviewed Which shows multilevel lumbar degenerative changes Needs orthopedic evaluation Referral placed today Pain management discussed      Relevant Medications   traMADol  (ULTRAM ) 50 MG tablet   Other Relevant Orders   Ambulatory referral to Orthopedic Surgery     Other   Lumbar pain - Primary   Musculoskeletal and mechanical in nature Clinically stable.  No red flag signs or symptoms. Pain management discussed Recommend Tylenol for mild to moderate pain and tramadol  for moderate to severe pain Recommend orthopedic evaluation.  Referral placed today.      Relevant Medications   traMADol  (ULTRAM ) 50 MG tablet   Other Relevant Orders   Ambulatory referral to Orthopedic Surgery   Patient Instructions  Acute Back Pain, Adult Acute back pain is sudden and usually short-lived. It is often caused by an injury to the muscles and tissues in the back. The injury may result from: A muscle, tendon, or ligament getting overstretched or torn. Ligaments are tissues that connect bones to each other. Lifting something improperly can cause a back strain. Wear and tear (degeneration) of the spinal disks. Spinal disks are circular tissue that provide cushioning between the bones of the spine (vertebrae). Twisting motions, such as while playing sports or doing yard work. A hit to the back. Arthritis. You may have a physical exam, lab tests, and imaging tests to find the cause of your pain. Acute back pain  usually goes away with rest and home care. Follow these instructions at home: Managing pain, stiffness, and swelling Take over-the-counter and prescription medicines only as told by your health care provider. Treatment may include medicines for  pain and inflammation that are taken by mouth or applied to the skin, or muscle relaxants. Your health care provider may recommend applying ice during the first 24-48 hours after your pain starts. To do this: Put ice in a plastic bag. Place a towel between your skin and the bag. Leave the ice on for 20 minutes, 2-3 times a day. Remove the ice if your skin turns bright red. This is very important. If you cannot feel pain, heat, or cold, you have a greater risk of damage to the area. If directed, apply heat to the affected area as often as told by your health care provider. Use the heat source that your health care provider recommends, such as a moist heat pack or a heating pad. Place a towel between your skin and the heat source. Leave the heat on for 20-30 minutes. Remove the heat if your skin turns bright red. This is especially important if you are unable to feel pain, heat, or cold. You have a greater risk of getting burned. Activity  Do not stay in bed. Staying in bed for more than 1-2 days can delay your recovery. Sit up and stand up straight. Avoid leaning forward when you sit or hunching over when you stand. If you work at a desk, sit close to it so you do not need to lean over. Keep your chin tucked in. Keep your neck drawn back, and keep your elbows bent at a 90-degree angle (right angle). Sit high and close to the steering wheel when you drive. Add lower back (lumbar) support to your car seat, if needed. Take short walks on even surfaces as soon as you are able. Try to increase the length of time you walk each day. Do not sit, drive, or stand in one place for more than 30 minutes at a time. Sitting or standing for long periods of time can put stress on your back. Do not drive or use heavy machinery while taking prescription pain medicine. Use proper lifting techniques. When you bend and lift, use positions that put less stress on your back: [place] your knees. Keep the load close to your  body. Avoid twisting. Exercise regularly as told by your health care provider. Exercising helps your back heal faster and helps prevent back injuries by keeping muscles strong and flexible. Work with a physical therapist to make a safe exercise program, as recommended by your health care provider. Do any exercises as told by your physical therapist. Lifestyle Maintain a healthy weight. Extra weight puts stress on your back and makes it difficult to have good posture. Avoid activities or situations that make you feel anxious or stressed. Stress and anxiety increase muscle tension and can make back pain worse. Learn ways to manage anxiety and stress, such as through exercise. General instructions Sleep on a firm mattress in a comfortable position. Try lying on your side with your knees slightly bent. If you lie on your back, put a pillow under your knees. Keep your head and neck in a straight line with your spine (neutral position) when using electronic equipment like smartphones or pads. To do this: Raise your smartphone or pad to look at it instead of bending your head or neck to look down. Put the smartphone or pad at  the level of your face while looking at the screen. Follow your treatment plan as told by your health care provider. This may include: Cognitive or behavioral therapy. Acupuncture or massage therapy. Meditation or yoga. Contact a health care provider if: You have pain that is not relieved with rest or medicine. You have increasing pain going down into your legs or buttocks. Your pain does not improve after 2 weeks. You have pain at night. You lose weight without trying. You have a fever or chills. You develop nausea or vomiting. You develop abdominal pain. Get help right away if: You develop new bowel or bladder control problems. You have unusual weakness or numbness in your arms or legs. You feel faint. These symptoms may represent a serious problem that is an emergency.  Do not wait to see if the symptoms will go away. Get medical help right away. Call your local emergency services (911 in the [place] Do not drive yourself to the hospital. Summary Acute back pain is sudden and usually short-lived. Use proper lifting techniques. When you bend and lift, use positions that put less stress on your back. Take over-the-counter and prescription medicines only as told by your health care provider, and apply heat or ice as told. This information is not intended to replace advice given to you by your health care provider. Make sure you discuss any questions you have with your health care provider. Document Revised: 03/22/2020 Document Reviewed: 03/22/2020 Elsevier Patient Education  2024 Elsevier Inc.    Emil Schaumann, MD [facility]    [1]  Allergies Allergen Reactions   Aspirin     REACTION: gi

## 2024-02-09 ENCOUNTER — Encounter: Payer: Self-pay | Admitting: Physical Medicine and Rehabilitation

## 2024-02-09 ENCOUNTER — Ambulatory Visit: Admitting: Physical Medicine and Rehabilitation

## 2024-02-09 DIAGNOSIS — G8929 Other chronic pain: Secondary | ICD-10-CM | POA: Diagnosis not present

## 2024-02-09 DIAGNOSIS — M545 Low back pain, unspecified: Secondary | ICD-10-CM

## 2024-02-09 DIAGNOSIS — M7918 Myalgia, other site: Secondary | ICD-10-CM | POA: Diagnosis not present

## 2024-02-09 NOTE — Progress Notes (Signed)
 "  Jeremy [name] - 84 y.o. male MRN [id number]  Date of birth: 09/17/1940  Office Visit Note: Visit Date: 02/09/2024 PCP: Garald Karlynn GAILS, MD Referred by: Purcell Emil Schanz, MD  Subjective: Chief Complaint  Patient presents with   Lower Back - Pain   HPI: Jeremy [name] is a 84 y.o. male who comes in today per the request of Dr. Emil Purcell for evaluation of chronic, worsening and severe right sided lower back pain. Pain ongoing for several years, worsened over the last several weeks. Prolonged standing and walking causes increased pain. Most severe pain in the morning after waking up. He also reports pain with prolonged sitting and driving car. Feels his pain improves throughout the course of the day. He describes pain as sore and aching sensation, currently rates as 4 out of 10. Some relief of pain with home exercise regimen, rest and use of medications. He does use IcyHot as needed, also takes prednisone  as needed that is prescribed by his PCP. He is fairly active, does walk on treadmill several times a week. No history of formal physical therapy. Lumbar radiographs from 2024 shows multi level degenerative changes. Disc space narrowing most severe at L1-2 and L5-S1. No acute compression fracture. He was evaluated by Dr. Barbarann in 2023 for compression fracture of L2 vertebrae after helping to lift his wife out of the floor. No history of lumbar surgery/injections. Patient denies focal weakness, numbness and tingling. No recent trauma or falls.        Review of Systems  Musculoskeletal:  Positive for back pain and myalgias.  Neurological:  Negative for tingling, sensory change, focal weakness and weakness.  All other systems reviewed and are negative.  Otherwise per HPI.  Assessment & Plan: Visit Diagnoses:    ICD-10-CM   1. Chronic right-sided low back pain without sciatica  M54.50    G89.29     2. Myofascial pain syndrome  M79.18        Plan: Findings:   Chronic, worsening and severe right sided lower back pain. Patient continues to have severe pain despite good conservative therapies such as home exercise regimen, rest and use of medications. No radicular symptoms down the legs. Patients clinical presentation and exam are consistent with myofascial pain syndrome. There is painful palpable myofascial trigger point to right quadratus lumborum region on exam today. We discussed treatment plan in detail. I did perform trigger point injection to right quadratus lumborum in the office today. He tolerated without difficulty. I would like him to monitor pain relief over the next several days. If good pain relief we can continue to monitor, should his pain persist would recommend short course of formal physical therapy and dry needling. I instructed him to use ice pack on site of trigger point injection this evening for comfort. He has no questions at this time. If we feel this is more spine related or presents as more radicular in nature would consider obtaining lumbar MRI imaging. His exam today is non focal, good strength noted upon exam today.     Meds & Orders: No orders of the defined types were placed in this encounter.   Orders Placed This Encounter  Procedures   Trigger Point Inj    Follow-up: Return if symptoms worsen or fail to improve.   Procedures: Trigger Point Inj  Date/Time: 02/09/2024 1:48 PM  Performed by: Ilia Dimaano E, NP Authorized by: Corgan Mormile E, NP   Consent Given by:  Patient Indications:  Pain Total # of Trigger Points:  1 Location: back   Needle Size:  25 G Approach:  Dorsal Medications #1:  5 mL lidocaine  1 %; 40 mg triamcinolone  acetonide 40 MG/ML Patient tolerance:  Patient tolerated the procedure well with no immediate complications Comments: Myofascial trigger point injection to right quadratus lumborum, needling technique utilized. Patient tolerated without difficulty.       Clinical  History: Narrative & Impression CLINICAL DATA:  Left-sided low back pain   EXAM: LUMBAR SPINE - 2-3 VIEW   COMPARISON:  01/02/2022   FINDINGS: Similar multilevel lower thoracic and lumbar degenerative changes affecting all levels. Disc space narrowing most severe at L1-2 and L5-S1. Preserved vertebral body heights. No acute compression fracture or focal kyphosis. Facets are aligned. Bones are osteopenic. SI joints are maintained. Included upper pelvis unremarkable. Chronic known right nephrolithiasis measures 14 mm. Nonobstructive bowel gas pattern.   IMPRESSION: 1. Multilevel lumbar degenerative changes as above. 2. No acute finding by plain radiography.     Electronically Signed   By: CHRISTELLA.  Shick M.D.   On: 12/13/2022 09:04   He reports that he has been smoking cigars. He has never used smokeless tobacco. No results for input(s): HGBA1C, LABURIC in the last 8760 hours.  Objective:  VS:  HT:    WT:   BMI:     BP:   HR: bpm  TEMP: ( )  RESP:  Physical Exam Vitals and nursing note reviewed.  HENT:     Head: Normocephalic and atraumatic.     Right Ear: External ear normal.     Left Ear: External ear normal.     Nose: Nose normal.     Mouth/Throat:     Mouth: Mucous membranes are moist.  Eyes:     Extraocular Movements: Extraocular movements intact.  Cardiovascular:     Rate and Rhythm: Normal rate.     Pulses: Normal pulses.  Pulmonary:     Effort: Pulmonary effort is normal.  Abdominal:     General: Abdomen is flat. There is no distension.  Musculoskeletal:        General: Tenderness present.     Cervical back: Normal range of motion.     Comments: Patient rises from seated position to standing without difficulty. Good lumbar range of motion. No pain noted with facet loading. 5/5 strength noted with bilateral hip flexion, knee flexion/extension, ankle dorsiflexion/plantarflexion and EHL. No clonus noted bilaterally. No pain upon palpation of greater  trochanters. No pain with internal/external rotation of bilateral hips. Sensation intact bilaterally. Palpable myofascial trigger point to right quadratus lumborum region. Negative slump test bilaterally. Ambulates without aid, gait steady.     Skin:    General: Skin is warm and dry.     Capillary Refill: Capillary refill takes less than 2 seconds.  Neurological:     General: No focal deficit present.     Mental Status: He is alert and oriented to person, place, and time.  Psychiatric:        Mood and Affect: Mood normal.        Behavior: Behavior normal.     Ortho Exam  Imaging: No results found.  Past Medical/Family/Surgical/Social History: Medications & Allergies reviewed per EMR, new medications updated. Patient Active Problem List   Diagnosis Date Noted   Degeneration of intervertebral disc of lumbar region with discogenic back pain 01/31/2024   Lumbar pain 12/27/2023   Pink eye 09/15/2023   Right elbow pain 02/24/2023   Grief 09/07/2022  Prostate cancer (HCC)    Lumbar compression fracture (HCC) 12/02/2021   Acute lumbar myofascial strain 11/25/2021   Musculoskeletal back pain 11/25/2021   Patellofemoral arthritis of right knee 07/30/2017   Chronic low back pain 11/12/2016   Seasonal allergic rhinitis due to pollen 09/21/2014   Elevated BP without diagnosis of hypertension 02/19/2014   Primary localized osteoarthrosis, lower leg 03/15/2013   Osteoarthritis of left knee 02/03/2013   Dyslipidemia 10/08/2011   Well adult exam 09/22/2010   CAROTID BRUIT 08/22/2009   SKIN CANCER, HX OF 05/04/2008   History of colonic polyps 05/02/2007   Osteopenia 11/18/2006   PROSTATE CANCER, HX OF 10/14/2006   Past Medical History:  Diagnosis Date   AK (actinic keratosis)    Allergic rhinitis    Allergy    Arthritis    History of colonic polyps    Dr. Teressa- Due 2018   History of skin cancer    Osteopenia    Prostate cancer (HCC)    hx of s/p XRT, seeds Dr. Alline    Family History  Problem Relation Age of Onset   Liver disease Father    Cancer Father        pancreas ?   Cancer - Other Other        intestinal cancer   Past Surgical History:  Procedure Laterality Date   EYE SURGERY     INSERTION PROSTATE RADIATION SEED     KNEE ARTHROSCOPY Right    KNEE SURGERY Left    TONSILLECTOMY     XRT  01/12/2006   Social History   Occupational History   Occupation: retired from airline pilot  Tobacco Use   Smoking status: Some Days    Types: Cigars   Smokeless tobacco: Never   Tobacco comments:    2 cigars a month  Vaping Use   Vaping status: Never Used  Substance and Sexual Activity   Alcohol use: Yes    Alcohol/week: 2.0 standard drinks of alcohol    Types: 2 Cans of beer per week   Drug use: No   Sexual activity: Yes   "

## 2024-02-09 NOTE — Progress Notes (Signed)
 Pain Scale   Average Pain 4 Patient advising he has chronic lower back pain that increases when standing and walking.        +Driver, -BT, -Dye Allergies.

## 2024-02-14 MED ORDER — TRIAMCINOLONE ACETONIDE 40 MG/ML IJ SUSP
40.0000 mg | INTRAMUSCULAR | Status: AC | PRN
  Administered 2024-02-09: 40 mg via INTRAMUSCULAR

## 2024-02-14 MED ORDER — LIDOCAINE HCL 1 % IJ SOLN
5.0000 mL | INTRAMUSCULAR | Status: AC | PRN
  Administered 2024-02-09: 5 mL

## 2024-02-21 ENCOUNTER — Ambulatory Visit: Payer: Medicare HMO

## 2024-03-15 ENCOUNTER — Ambulatory Visit: Admitting: Internal Medicine

## 2024-03-17 ENCOUNTER — Ambulatory Visit: Admitting: Family Medicine
